# Patient Record
Sex: Female | Born: 1970 | Race: White | Hispanic: No | Marital: Single | Smoking: Never smoker
Health system: Southern US, Community
[De-identification: ages and names within clinical notes are randomized; demographics above are authoritative.]

## PROBLEM LIST (undated history)

## (undated) DIAGNOSIS — E669 Obesity, unspecified: Secondary | ICD-10-CM

## (undated) DIAGNOSIS — J45909 Unspecified asthma, uncomplicated: Secondary | ICD-10-CM

## (undated) DIAGNOSIS — R519 Headache, unspecified: Secondary | ICD-10-CM

## (undated) DIAGNOSIS — D649 Anemia, unspecified: Secondary | ICD-10-CM

## (undated) DIAGNOSIS — R0600 Dyspnea, unspecified: Secondary | ICD-10-CM

## (undated) DIAGNOSIS — I10 Essential (primary) hypertension: Secondary | ICD-10-CM

## (undated) DIAGNOSIS — K219 Gastro-esophageal reflux disease without esophagitis: Secondary | ICD-10-CM

## (undated) DIAGNOSIS — F32A Depression, unspecified: Secondary | ICD-10-CM

## (undated) DIAGNOSIS — R011 Cardiac murmur, unspecified: Secondary | ICD-10-CM

## (undated) DIAGNOSIS — E119 Type 2 diabetes mellitus without complications: Secondary | ICD-10-CM

## (undated) DIAGNOSIS — E079 Disorder of thyroid, unspecified: Secondary | ICD-10-CM

## (undated) HISTORY — PX: BIOPSY THYROID: PRO38

## (undated) HISTORY — PX: ABDOMINAL HYSTERECTOMY: SHX81

## (undated) HISTORY — DX: Obesity, unspecified: E66.9

## (undated) HISTORY — DX: Type 2 diabetes mellitus without complications: E11.9

---

## 1999-01-04 ENCOUNTER — Encounter: Payer: Self-pay | Admitting: Family Medicine

## 1999-01-04 ENCOUNTER — Ambulatory Visit (HOSPITAL_COMMUNITY): Admission: RE | Admit: 1999-01-04 | Discharge: 1999-01-04 | Payer: Self-pay | Admitting: Family Medicine

## 2000-03-31 ENCOUNTER — Emergency Department (HOSPITAL_COMMUNITY): Admission: EM | Admit: 2000-03-31 | Discharge: 2000-03-31 | Payer: Self-pay | Admitting: Emergency Medicine

## 2000-07-12 ENCOUNTER — Ambulatory Visit (HOSPITAL_COMMUNITY): Admission: RE | Admit: 2000-07-12 | Discharge: 2000-07-12 | Payer: Self-pay | Admitting: Family Medicine

## 2000-07-12 ENCOUNTER — Encounter: Payer: Self-pay | Admitting: Family Medicine

## 2001-10-15 ENCOUNTER — Other Ambulatory Visit: Admission: RE | Admit: 2001-10-15 | Discharge: 2001-10-15 | Payer: Self-pay | Admitting: Obstetrics and Gynecology

## 2002-08-11 ENCOUNTER — Encounter: Payer: Self-pay | Admitting: Endocrinology

## 2002-08-11 ENCOUNTER — Ambulatory Visit (HOSPITAL_COMMUNITY): Admission: RE | Admit: 2002-08-11 | Discharge: 2002-08-11 | Payer: Self-pay | Admitting: Endocrinology

## 2002-11-27 ENCOUNTER — Other Ambulatory Visit: Admission: RE | Admit: 2002-11-27 | Discharge: 2002-11-27 | Payer: Self-pay | Admitting: Obstetrics and Gynecology

## 2005-07-13 ENCOUNTER — Ambulatory Visit (HOSPITAL_COMMUNITY): Admission: RE | Admit: 2005-07-13 | Discharge: 2005-07-13 | Payer: Self-pay | Admitting: Chiropractic Medicine

## 2005-08-18 ENCOUNTER — Emergency Department (HOSPITAL_COMMUNITY): Admission: EM | Admit: 2005-08-18 | Discharge: 2005-08-18 | Payer: Self-pay | Admitting: Emergency Medicine

## 2005-08-20 ENCOUNTER — Emergency Department (HOSPITAL_COMMUNITY): Admission: EM | Admit: 2005-08-20 | Discharge: 2005-08-20 | Payer: Self-pay | Admitting: Emergency Medicine

## 2006-12-03 ENCOUNTER — Encounter: Admission: RE | Admit: 2006-12-03 | Discharge: 2006-12-03 | Payer: Self-pay | Admitting: Emergency Medicine

## 2020-10-20 ENCOUNTER — Other Ambulatory Visit: Payer: Self-pay

## 2020-10-20 ENCOUNTER — Emergency Department (HOSPITAL_COMMUNITY)
Admission: EM | Admit: 2020-10-20 | Discharge: 2020-10-20 | Disposition: A | Discharge status: Home / Self Care | Payer: Managed Care, Other (non HMO) | Attending: Emergency Medicine | Admitting: Emergency Medicine

## 2020-10-20 ENCOUNTER — Encounter (HOSPITAL_COMMUNITY): Payer: Self-pay

## 2020-10-20 DIAGNOSIS — I10 Essential (primary) hypertension: Secondary | ICD-10-CM | POA: Diagnosis not present

## 2020-10-20 DIAGNOSIS — R030 Elevated blood-pressure reading, without diagnosis of hypertension: Secondary | ICD-10-CM | POA: Diagnosis present

## 2020-10-20 DIAGNOSIS — Z79899 Other long term (current) drug therapy: Secondary | ICD-10-CM | POA: Diagnosis not present

## 2020-10-20 HISTORY — DX: Essential (primary) hypertension: I10

## 2020-10-20 HISTORY — DX: Disorder of thyroid, unspecified: E07.9

## 2020-10-20 LAB — COMPREHENSIVE METABOLIC PANEL
ALT: 15 U/L (ref 0–44)
AST: 17 U/L (ref 15–41)
Albumin: 3.4 g/dL — ABNORMAL LOW (ref 3.5–5.0)
Alkaline Phosphatase: 51 U/L (ref 38–126)
Anion gap: 7 (ref 5–15)
BUN: 12 mg/dL (ref 6–20)
CO2: 25 mmol/L (ref 22–32)
Calcium: 9 mg/dL (ref 8.9–10.3)
Chloride: 105 mmol/L (ref 98–111)
Creatinine, Ser: 0.66 mg/dL (ref 0.44–1.00)
GFR, Estimated: >60 mL/min (ref >60)
Glucose, Bld: 251 mg/dL — ABNORMAL HIGH (ref 70–99)
Potassium: 3.8 mmol/L (ref 3.5–5.1)
Sodium: 137 mmol/L (ref 135–145)
Total Bilirubin: 0.6 mg/dL (ref 0.3–1.2)
Total Protein: 6.9 g/dL (ref 6.5–8.1)

## 2020-10-20 LAB — CBC
HCT: 38.7 % (ref 36.0–46.0)
Hemoglobin: 12 g/dL (ref 12.0–15.0)
MCH: 26.8 pg (ref 26.0–34.0)
MCHC: 31 g/dL (ref 30.0–36.0)
MCV: 86.4 fL (ref 80.0–100.0)
Platelets: 250 10*3/uL (ref 150–400)
RBC: 4.48 MIL/uL (ref 3.87–5.11)
RDW: 19 % — ABNORMAL HIGH (ref 11.5–15.5)
WBC: 4 10*3/uL (ref 4.0–10.5)
nRBC: 0 % (ref 0.0–0.2)

## 2020-10-20 LAB — LIPASE, BLOOD: Lipase: 32 U/L (ref 11–51)

## 2020-10-20 MED ORDER — AMLODIPINE BESYLATE 5 MG PO TABS
5.0000 mg | ORAL_TABLET | Freq: Once | ORAL | Status: DC
Start: 2020-10-20 — End: 2020-10-20
  Filled 2020-10-20: qty 1

## 2020-10-20 NOTE — ED Provider Notes (Signed)
[facility] EMERGENCY DEPARTMENT Provider Note   CSN: [id number] Arrival date & time: 10/20/20  0647     History No chief complaint on file.   Laurice A Roszak is a 50 y.o. female with a pmh of HTN, migraines, grave's and T2DM presenting with concerns about her BP. She moved to [facility] from [place] in February and is out of her Lisinopril 40mg  and Norvasc 10mg , but has her Olmesartan 40mg , HCTZ 25mg  and Metoprolol ER 25mg . Says her blood pressures at home run anywhere from 170s/90s and 200s/100s. Did not take her medication this AM.  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 Says her labs alternate between hyper and hypothyroidism. Has a family history of MEN which concerns her. Has had an endocrinology appointment that keeps being cancelled. No PCP but a clinic in the area drew her blood and told her that her DM is uncontrolled on her Metformin.  Overall, patient reports many concerns surrounding inconsistent medical care due to insurance laps and being "picky about doctors." When asked what made her come in today she states that she has had "left sided pains" in her ankle, elbow and shoulder for a month and she worried it could be a heart attack. Denies CP, palpitations, SOB.   Requests referrals to low-cost clinics and financial assistance for medications. Reports she "has no money until Friday" and is living with her friends, but they are driving her crazy.   HPI   Past Medical History:  Diagnosis Date   Hypertension    Thyroid disease     There are no problems to display for this patient.   History reviewed. No pertinent surgical history.   OB History   No obstetric history on file.     No family history on file.     Home Medications Prior to Admission medications   Medication Sig Start Date End Date Taking? Authorizing Provider  acetaminophen (TYLENOL) 325 MG tablet Take 650 mg by mouth every 6 (six) hours as needed.   Yes  [provider]  amLODipine (NORVASC) 10 MG tablet Take 10 mg by mouth daily.   Yes [provider]  ferrous sulfate 325 (65 FE) MG tablet Take 325 mg by mouth 2 (two) times daily with a meal.   Yes [provider]  fluticasone (VERAMYST) 27.5 MCG/SPRAY nasal spray Place 1 spray into the nose daily.   Yes [provider]  hydrochlorothiazide (HYDRODIURIL) 25 MG tablet Take 25 mg by mouth daily.   Yes [provider]  lisinopril (ZESTRIL) 40 MG tablet Take 40 mg by mouth daily.   Yes [provider]  metFORMIN (GLUCOPHAGE) 1000 MG tablet Take 1,000 mg by mouth 2 (two) times daily with a meal.   Yes [provider]  metoprolol succinate (TOPROL-XL) 25 MG 24 hr tablet Take 25 mg by mouth daily.   Yes [provider]  olmesartan (BENICAR) 40 MG tablet Take 40 mg by mouth daily.   Yes [provider]  omeprazole (PRILOSEC) 20 MG capsule Take 20 mg by mouth daily.   Yes [provider]  sodium chloride (OCEAN) 0.65 % SOLN nasal spray Place 1 spray into both nostrils as needed for congestion.   Yes [provider]  White Petrolatum-Mineral Oil (REFRESH LACRI-LUBE OP) Apply 1-2 drops to eye daily.   Yes [provider]    Allergies    Patient has no known allergies.  Review of Systems   Review of  Systems  Constitutional:  Positive for fatigue. Negative for activity change and fever.  Eyes:  Negative for photophobia and visual disturbance.  Respiratory:  Negative for chest tightness, shortness of breath and wheezing.   Cardiovascular:  Negative for chest pain, palpitations and leg swelling.  Gastrointestinal:  Positive for abdominal distention, constipation and diarrhea. Negative for abdominal pain, nausea and vomiting.  Endocrine: Positive for cold intolerance and heat intolerance. Negative for polydipsia, polyphagia and polyuria.  Musculoskeletal:  Negative for back pain.  Skin:  Negative  for color change and pallor.  Neurological:  Positive for headaches. Negative for dizziness, syncope, light-headedness and numbness.  Psychiatric/Behavioral:  Negative for confusion.    Physical Exam Updated Vital Signs BP (!) 162/77   Pulse 62   Temp 98.4 F (36.9 C) (Oral)   Resp 11   SpO2 100%   Physical Exam Constitutional:      General: She is not in acute distress.    Appearance: Normal appearance.  HENT:     Head: Normocephalic and atraumatic.  Eyes:     Extraocular Movements: Extraocular movements intact.     Conjunctiva/sclera: Conjunctivae normal.     Pupils: Pupils are equal, round, and reactive to light.  Cardiovascular:     Rate and Rhythm: Normal rate and regular rhythm.     Pulses: Normal pulses.     Heart sounds: No murmur heard.   No gallop.  Pulmonary:     Effort: No respiratory distress.     Breath sounds: No wheezing.  Chest:     Chest wall: No tenderness.  Abdominal:     General: Abdomen is flat. There is no distension.     Palpations: Abdomen is soft.     Tenderness: There is no abdominal tenderness.  Musculoskeletal:        General: No swelling.     Cervical back: Normal range of motion.     Right lower leg: No edema.     Left lower leg: No edema.  Skin:    General: Skin is warm and dry.     Findings: No bruising or lesion.  Neurological:     General: No focal deficit present.     Mental Status: She is alert and oriented to person, place, and time.  Psychiatric:        Thought Content: Thought content normal.     Comments: Multiple complaints, very anxious    ED Results / Procedures / Treatments   Labs (all labs ordered are listed, but only abnormal results are displayed) Labs Reviewed  COMPREHENSIVE METABOLIC PANEL - Abnormal; Notable for the following components:      Result Value   Glucose, Bld 251 (*)    Albumin 3.4 (*)    All other components within normal limits  CBC - Abnormal; Notable for the following components:   RDW  19.0 (*)    All other components within normal limits  LIPASE, BLOOD    EKG None  Radiology No results found.  Procedures Procedures   Medications Ordered in ED Medications  amLODipine (NORVASC) tablet 5 mg (has no administration in time range)    ED Course  I have reviewed the triage vital signs and the nursing notes.  Pertinent labs & imaging results that were available during my care of the patient were reviewed by me and considered in my medical decision making (see chart for details).    MDM Rules/Calculators/A&P  Patient evaluated by me. History contains many medical complaints and stories. Blood pressure between 160-180/70s throughout my evaluation. Current glucose 251. Has not had metformin since her move.  Patient in need of a PCP and likely an endocrinologist.   Lab work is benign and patient is in NAD and HD stable. She does not complain of associated CP, H/A, abd pain or changes in her vision. No signs of AAA, dissection or HTN urgency/emergency.  Due to her stability and chronic complaints, I will discharge here with her norvasc, lisinopril and metformin and refer her to low cost clinics in the area.   8:45a Patient surprised that her BP is good here, and she is concerned about her cuff. Apprehensive of d/c. Assured that none of her evaluation was concerning. She does not want goodrx information/prescriptions. Will follow-up with pcp about her DM & thyroid function. We will let them decide which of her 5 BP medications are neccessary.    Final Clinical Impression(s) / ED Diagnoses Final diagnoses:  Hypertension, unspecified type    Rx / DC Orders ED Discharge Orders     None        Angelisa Winthrop, [place], PA-C 10/20/20 Amanda Park, [place], DO 10/22/20 [id number]

## 2020-10-20 NOTE — Discharge Instructions (Addendum)
Your bloodwork was reassuring today. We were unable to get a thyroid level on you, so please discuss this with your new primary care doctor.   As we discussed, your pcp will discuss which of your 5 blood pressure medications to stay on. Continue to monitor at home, and check the accuracy of your cuff at any healthcare clinic.  Return to the ER if you have a sudden onset headache, chest or back pain, slurring in your speech or changes of vision.

## 2020-10-20 NOTE — ED Triage Notes (Addendum)
Patient here for HTN. States that her BP has been elevated for several months and is out of norvasc and lisinopril and taking her HCTZ.  Patient alert and oriented, NAD.  Reports intermittent left shoulder and ankle pain. Also complains of abdominal bloating

## 2020-10-20 NOTE — ED Notes (Signed)
Lab to add on TSH

## 2020-10-20 NOTE — ED Notes (Signed)
Patient verbalizes understanding of discharge instructions. Opportunity for questioning and answers were provided. Armband removed by staff, pt discharged from ED and ambulated to lobby to return home.   

## 2020-10-27 ENCOUNTER — Encounter (HOSPITAL_COMMUNITY): Payer: Self-pay | Admitting: Emergency Medicine

## 2020-10-27 ENCOUNTER — Other Ambulatory Visit: Payer: Self-pay

## 2020-10-27 ENCOUNTER — Emergency Department (HOSPITAL_COMMUNITY)
Admission: EM | Admit: 2020-10-27 | Discharge: 2020-10-27 | Disposition: A | Discharge status: Home / Self Care | Payer: Managed Care, Other (non HMO) | Attending: Emergency Medicine | Admitting: Emergency Medicine

## 2020-10-27 DIAGNOSIS — E1165 Type 2 diabetes mellitus with hyperglycemia: Secondary | ICD-10-CM | POA: Insufficient documentation

## 2020-10-27 DIAGNOSIS — R112 Nausea with vomiting, unspecified: Secondary | ICD-10-CM | POA: Diagnosis not present

## 2020-10-27 DIAGNOSIS — I10 Essential (primary) hypertension: Secondary | ICD-10-CM | POA: Diagnosis not present

## 2020-10-27 DIAGNOSIS — Z79899 Other long term (current) drug therapy: Secondary | ICD-10-CM | POA: Diagnosis not present

## 2020-10-27 DIAGNOSIS — R197 Diarrhea, unspecified: Secondary | ICD-10-CM | POA: Insufficient documentation

## 2020-10-27 DIAGNOSIS — Z7984 Long term (current) use of oral hypoglycemic drugs: Secondary | ICD-10-CM | POA: Diagnosis not present

## 2020-10-27 DIAGNOSIS — N939 Abnormal uterine and vaginal bleeding, unspecified: Secondary | ICD-10-CM | POA: Diagnosis not present

## 2020-10-27 DIAGNOSIS — R102 Pelvic and perineal pain: Secondary | ICD-10-CM | POA: Diagnosis present

## 2020-10-27 LAB — CBC WITH DIFFERENTIAL/PLATELET
Abs Immature Granulocytes: 0.04 10*3/uL (ref 0.00–0.07)
Basophils Absolute: 0 10*3/uL (ref 0.0–0.1)
Basophils Relative: 0 %
Eosinophils Absolute: 0 10*3/uL (ref 0.0–0.5)
Eosinophils Relative: 0 %
HCT: 33.4 % — ABNORMAL LOW (ref 36.0–46.0)
Hemoglobin: 10.5 g/dL — ABNORMAL LOW (ref 12.0–15.0)
Immature Granulocytes: 0 %
Lymphocytes Relative: 6 %
Lymphs Abs: 0.7 10*3/uL (ref 0.7–4.0)
MCH: 27 pg (ref 26.0–34.0)
MCHC: 31.4 g/dL (ref 30.0–36.0)
MCV: 85.9 fL (ref 80.0–100.0)
Monocytes Absolute: 0.4 10*3/uL (ref 0.1–1.0)
Monocytes Relative: 3 %
Neutro Abs: 10.8 10*3/uL — ABNORMAL HIGH (ref 1.7–7.7)
Neutrophils Relative %: 91 %
Platelets: 330 10*3/uL (ref 150–400)
RBC: 3.89 MIL/uL (ref 3.87–5.11)
RDW: 18.6 % — ABNORMAL HIGH (ref 11.5–15.5)
WBC: 12 10*3/uL — ABNORMAL HIGH (ref 4.0–10.5)
nRBC: 0 % (ref 0.0–0.2)

## 2020-10-27 LAB — COMPREHENSIVE METABOLIC PANEL
ALT: 17 U/L (ref 0–44)
AST: 16 U/L (ref 15–41)
Albumin: 4 g/dL (ref 3.5–5.0)
Alkaline Phosphatase: 63 U/L (ref 38–126)
Anion gap: 12 (ref 5–15)
BUN: 9 mg/dL (ref 6–20)
CO2: 25 mmol/L (ref 22–32)
Calcium: 9.2 mg/dL (ref 8.9–10.3)
Chloride: 99 mmol/L (ref 98–111)
Creatinine, Ser: 0.7 mg/dL (ref 0.44–1.00)
GFR, Estimated: >60 mL/min (ref >60)
Glucose, Bld: 358 mg/dL — ABNORMAL HIGH (ref 70–99)
Potassium: 3.6 mmol/L (ref 3.5–5.1)
Sodium: 136 mmol/L (ref 135–145)
Total Bilirubin: 0.3 mg/dL (ref 0.3–1.2)
Total Protein: 7.7 g/dL (ref 6.5–8.1)

## 2020-10-27 LAB — TYPE AND SCREEN
ABO/RH(D): A POS
Antibody Screen: NEGATIVE

## 2020-10-27 LAB — I-STAT BETA HCG BLOOD, ED (MC, WL, AP ONLY): I-stat hCG, quantitative: <5 m[IU]/mL (ref <5)

## 2020-10-27 LAB — LIPASE, BLOOD: Lipase: 26 U/L (ref 11–51)

## 2020-10-27 MED ORDER — SODIUM CHLORIDE 0.9 % IV BOLUS
1000.0000 mL | Freq: Once | INTRAVENOUS | Status: AC
  Administered 2020-10-27: 1000 mL via INTRAVENOUS

## 2020-10-27 MED ORDER — ONDANSETRON 4 MG PO TBDP
4.0000 mg | ORAL_TABLET | Freq: Once | ORAL | Status: AC
  Administered 2020-10-27: 4 mg via ORAL
  Filled 2020-10-27: qty 1

## 2020-10-27 MED ORDER — ONDANSETRON 4 MG PO TBDP: 4.0000 mg | ORAL_TABLET | Freq: Three times a day (TID) | ORAL | 0 refills | Status: DC | PRN

## 2020-10-27 MED ORDER — ONDANSETRON HCL 4 MG/2ML IJ SOLN
4.0000 mg | Freq: Once | INTRAMUSCULAR | Status: AC
  Administered 2020-10-27: 4 mg via INTRAVENOUS
  Filled 2020-10-27: qty 2

## 2020-10-27 NOTE — ED Provider Notes (Signed)
Emergency Medicine Provider Triage Evaluation Note  Crystal Vasquez , a 50 y.o. female  was evaluated in triage.  Pt complains of nvd, abd pain and vaginal bleeding that started yesterday. Hx fibroids and heavy menses. States she has passed many clots.  Review of Systems  Positive: Nvd, abd pain, vaginal bleeding Negative: Fevers, syncope  Physical Exam  BP (!) 206/94 (BP Location: Right Arm)   Pulse 81   Temp 98.5 F (36.9 C) (Oral)   Resp 16   Ht 5\' 6"  (1.676 m)   Wt 81.6 kg   LMP 10/26/2020   SpO2 100%   BMI 29.05 kg/m  Gen:   Awake, no distress   Resp:  Normal effort  MSK:   Moves extremities without difficulty  Other:  Bilat lower abd ttp, pale conjunctiva  Medical Decision Making  Medically screening exam initiated at 1:25 PM.  Appropriate orders placed.  Crystal Vasquez was informed that the remainder of the evaluation will be completed by another provider, this initial triage assessment does not replace that evaluation, and the importance of remaining in the ED until their evaluation is complete.     Bishop Dublin 10/27/20 1325    Milton Ferguson, MD 10/31/20 1028

## 2020-10-27 NOTE — ED Triage Notes (Addendum)
Pt here for watery diarrhea and vaginal bleeding that started yesterday, N/V that began this morning. Pt only able to tolerate sips of water. Hx fibroids, stating she's passed multiple clots vaginally. C/o lower abd pain.

## 2020-10-27 NOTE — Discharge Instructions (Addendum)
We discussed that your nausea / vomiting / diarrhea appears acute and limited in nature, and may be secondary to a short term food poisoning vs a short lived viral infection. Recommend re-evaluation if symptoms persist despite anti-nausea medications. We discussed there is no active hemorrhage and that vaginal bleeding is large in quantity but appears consistent with her perimenopausal irregular periods along with known diagnosis of fibroids. I recommend you follow up with your OBGYN at your earliest convenience. We discussed that your blood pressure is elevated today partially due to inability to take your blood pressure medications. Return to taking your medications as directed as soon as you are able to, and return to the emergency department if you are experiencing new onset vision changes, chest pain, shortness of breath, weakness, numbness, or other mental changes. Follow up with PCP for better blood pressure control as soon as you are able.

## 2020-10-27 NOTE — ED Provider Notes (Signed)
[facility] EMERGENCY DEPARTMENT Provider Note   CSN: [id number] Arrival date & time: 10/27/20  1248     History Chief Complaint  Patient presents with   Vaginal Bleeding   Abdominal Pain    Crystal Vasquez is a 50 y.o. female with a PMH significant for HTN, DM, and uterine fibroids who presents to the department today complaining of one day of acute nausea, vomiting, and diarrhea following a 4am meal of [employer] this morning. Patient has vomited 5 times and had ongoing diarrhea since this episode began. Patient denies acute abdominal pain. Patient denies blood in emesis or diarrhea. Patient also reports heavy vaginal bleeding for the past 2 days which she reports is consistent with an ongoing pattern of heavy vaginal bleeding 2/2 perimenopause and an established diagnosis of uterine fibroids. Patient is nonetheless concerned about the amount of bleeding and would like evaluation to rule out new onset pathology, ongoing hemorrhage. Patient is experiencing her usual amount of pelvic pain and discomfort with her menstrual periods. She also reports alternating chills and hot flashes over the past few weeks. Incidental finding of hypertension to 206/94 on presentation to the department. Patient denies new onset acute vision changes, drooping facial muscles, loss of balance, numbness, tingling, mental deficit. Patient is currently taking HCTZ and Olmesartan but has not taken either of these medications today due to nausea and vomiting. Patient has also not taken her metformin today as prescribed.   Vaginal Bleeding Associated symptoms: abdominal pain and nausea   Associated symptoms: no dysuria   Abdominal Pain Associated symptoms: diarrhea, nausea, vaginal bleeding and vomiting   Associated symptoms: no chest pain, no cough, no dysuria, no shortness of breath and no sore throat       Past Medical History:  Diagnosis Date   Hypertension    Thyroid disease     There  are no problems to display for this patient.   History reviewed. No pertinent surgical history.   OB History   No obstetric history on file.     History reviewed. No pertinent family history.     Home Medications Prior to Admission medications   Medication Sig Start Date End Date Taking? Authorizing Provider  acetaminophen (TYLENOL) 325 MG tablet Take 650 mg by mouth every 6 (six) hours as needed for moderate pain.   Yes [provider]  ferrous sulfate 325 (65 FE) MG tablet Take 325 mg by mouth 2 (two) times daily with a meal.   Yes [provider]  fluticasone (VERAMYST) 27.5 MCG/SPRAY nasal spray Place 1 spray into the nose daily.   Yes [provider]  hydrochlorothiazide (HYDRODIURIL) 25 MG tablet Take 25 mg by mouth daily.   Yes [provider]  lisinopril (ZESTRIL) 40 MG tablet Take 40 mg by mouth daily.   Yes [provider]  metFORMIN (GLUCOPHAGE) 1000 MG tablet Take 1,000 mg by mouth 2 (two) times daily with a meal.   Yes [provider]  metoprolol succinate (TOPROL-XL) 25 MG 24 hr tablet Take 25 mg by mouth daily.   Yes [provider]  olmesartan (BENICAR) 40 MG tablet Take 40 mg by mouth daily.   Yes [provider]  omeprazole (PRILOSEC) 20 MG capsule Take 20 mg by mouth daily.   Yes [provider]  ondansetron (ZOFRAN ODT) 4 MG disintegrating tablet Take 1 tablet (4 mg total) by mouth every 8 (eight) hours as needed for nausea or vomiting. 10/27/20  Yes Jasie Meleski  H, PA-C  sodium chloride (OCEAN) 0.65 % SOLN nasal spray Place 1 spray into both nostrils as needed for congestion.   Yes [provider]  White Petrolatum-Mineral Oil (REFRESH LACRI-LUBE OP) Apply 1-2 drops to eye daily.   Yes [provider]  amLODipine (NORVASC) 10 MG tablet Take 10 mg by mouth daily.    [provider]    Allergies    Patient has no known allergies.  Review of Systems    Review of Systems  Constitutional:  Positive for appetite change.       Alternating chills and hot flashes  HENT:  Negative for congestion, sinus pressure, sinus pain and sore throat.   Eyes:  Negative for visual disturbance.  Respiratory:  Negative for cough, chest tightness and shortness of breath.   Cardiovascular:  Negative for chest pain and palpitations.  Gastrointestinal:  Positive for abdominal pain, diarrhea, nausea and vomiting. Negative for blood in stool.  Genitourinary:  Positive for pelvic pain and vaginal bleeding. Negative for dysuria and flank pain.  Neurological:  Negative for syncope, speech difficulty, weakness, numbness and headaches.   Physical Exam Updated Vital Signs BP (!) 144/79   Pulse 96   Temp 98.5 F (36.9 C) (Oral)   Resp 15   Ht 5\' 6"  (1.676 m)   Wt 81.6 kg   LMP 10/26/2020   SpO2 100%   BMI 29.05 kg/m   Physical Exam Vitals and nursing note reviewed. Exam conducted with a chaperone present (during pelvic exam).  Constitutional:      Appearance: She is well-developed.     Comments: Patient appears somewhat pale and ill-appearing  HENT:     Head: Normocephalic and atraumatic.     Mouth/Throat:     Pharynx: Oropharynx is clear.  Eyes:     Extraocular Movements: Extraocular movements intact.     Pupils: Pupils are equal, round, and reactive to light.  Cardiovascular:     Rate and Rhythm: Normal rate and regular rhythm.     Heart sounds: Normal heart sounds.  Pulmonary:     Effort: Pulmonary effort is normal.     Breath sounds: Normal breath sounds.  Abdominal:     General: Abdomen is flat. Bowel sounds are normal. There is no distension. There are no signs of injury.     Palpations: Abdomen is soft.     Comments: Diffuse mild tenderness over bilateral lower quadrants  Genitourinary:    Vagina: Bleeding present. No vaginal discharge.     Uterus: Enlarged.      Adnexa: Right adnexa normal and left adnexa normal.     Comments: External  vulva, labia, and vaginal introitus appear moist, pink, without lesion. Large amount of blood and clots found in vaginal vault. Cervix visualized and without active hemorrhage at the time of exam. Slow leak of menstrual blood. Bimanual exam reveals large palpable fibroids in approximately the 5-6 o clock position on the uterus with relation to the cervix. No other masses palpated. Skin:    General: Skin is warm.     Coloration: Skin is pale.  Neurological:     General: No focal deficit present.     Mental Status: She is alert and oriented to person, place, and time.     Cranial Nerves: No cranial nerve deficit.     Motor: No weakness.  Psychiatric:        Mood and Affect: Mood normal.        Behavior: Behavior normal.  ED Results / Procedures / Treatments   Labs (all labs ordered are listed, but only abnormal results are displayed) Labs Reviewed  COMPREHENSIVE METABOLIC PANEL - Abnormal; Notable for the following components:      Result Value   Glucose, Bld 358 (*)    All other components within normal limits  CBC WITH DIFFERENTIAL/PLATELET - Abnormal; Notable for the following components:   WBC 12.0 (*)    Hemoglobin 10.5 (*)    HCT 33.4 (*)    RDW 18.6 (*)    Neutro Abs 10.8 (*)    All other components within normal limits  LIPASE, BLOOD  I-STAT BETA HCG BLOOD, ED ([facility], [facility], [facility] ONLY)  TYPE AND SCREEN  ABO/RH    EKG None  Radiology No results found.  Procedures Procedures   Medications Ordered in ED Medications  ondansetron (ZOFRAN-ODT) disintegrating tablet 4 mg (4 mg Oral Given 10/27/20 1327)  ondansetron (ZOFRAN) injection 4 mg (4 mg Intravenous Given 10/27/20 1939)  sodium chloride 0.9 % bolus 1,000 mL (0 mLs Intravenous Stopped 10/27/20 2123)    ED Course  I have reviewed the triage vital signs and the nursing notes.  Pertinent labs & imaging results that were available during my care of the patient were reviewed by me and considered in my medical decision  making (see chart for details).     MDM Rules/Calculators/A&P                         Nausea/Vomiting/Diarrhea Physical exam shows only diffuse lower abdominal pain. Likely menstrual in origin. Patient with mildly elevated white count. Presentation is consistent with foodborne illness vs. viral gastroenteritis. Lipase negative. Patient's nausea and vomiting improved markedly with second dose of zofran, and small IV bolus of NS. Low concern for appendicitis or diverticulitis at this time. Patient should follow up with PCP or GI if N/V/D diarrhea does not improve. Rx zofran given to patient, fluid intake encouraged.  Vaginal Bleeding History reveals bleeding is consistent with existing findings of perimenopausal intermittent heavy periods in context of large uterine fibroids. Patient nonetheless concerned about amount of bleeding. Beta hCG negative. Pelvic exam reveals large clots in vaginal introitus with minimal slow bleeding from cervical os. No evidence of hemorrhage at this time. Bimanual exam reveals presence of large mass at 5-6 o clock position on the uterus with respect to the cervical os consistent with a large fibroid. Patient reassured no concerning hemorrhage at this time. Hgb subcritical at 10.5. Patient encouraged to continue oral iron supplementation as nausea allows. Given decrease of Hgb by 1.5 points from last visit one week ago, patient encouraged to monitor for signs of lightheadedness, presyncope, weakness, pallor and given low threshold to seek re-evaluation for blood loss. In the meantime encouraged prompt follow up with OBGYN to further evaluate menstrual bleeding.  Asymptomatic Hypertension No focal neurologic deficits. No evidence of kidney damage on CMP. No acute visual changes. Patient denies chest pain, shortness of breath. Discussed with patient no evidence of end organ damage at this time. Given patient did not comply with medication this morning, recommend patient restart  HTN medications once her nausea allows. Given ongoing labile blood pressures, recommend patient follow up with PCP at her earliest convenience for additional control of HTN. Patient should return to ED if she is experiencing signs of stroke, chest pain, acute vision changes. Hypertension markedly improved to 144/79 at time of discharge.  Hyperglycemia Patient without any signs or symptoms of  DKA or HHS at this time. Patient did not take her metformin this morning. Recommend she take her metformin when resolution of nausea allows. Patient should check her blood sugars throughout the day and follow up with her PCP for tighter glycemic control. Final Clinical Impression(s) / ED Diagnoses Final diagnoses:  Vaginal bleeding  Nausea and vomiting, intractability of vomiting not specified, unspecified vomiting type  Hypertension, unspecified type    Rx / DC Orders ED Discharge Orders          Ordered    ondansetron (ZOFRAN ODT) 4 MG disintegrating tablet  Every 8 hours PRN        10/27/20 2036             Dorien Chihuahua 10/27/20 2309    Davonna Belling, MD 10/27/20 [phone]

## 2020-10-27 NOTE — ED Provider Notes (Signed)
Pt seen in conjunction with C Prosperi, PA-C. Please see his notes for full history, physical, and plan.   In brief, patient presenting for multiple complaints.  Today she developed nausea, vomiting, diarrhea after eating [employer] at 4 AM.  No significant abdominal pain.  No fevers. Additionally, patient reporting elevated blood pressures, however no chest pain, headache, shortness of breath, or change in urination.  History of hypertension, is on 5 different blood pressure medications.  She is unable to take them this morning due to nausea. Additionally, patient reporting vaginal bleeding and lower abdominal pain. She has h/o fibroids and due to being perimenopausal, h/o heavy vaginal bleeding.   On exam, patient is nontoxic.  No abdominal tenderness on my exam.  Labs overall reassuring.  Symptoms resolved with Zofran and fluids.  Pelvic exam showed large clot, but no significant hemorrhage.  Hemoglobin lower than a week ago, however in the setting of normal blood pressure and no tachycardia and no symptoms of anemia, patient does not need transfusion.  Encouraged follow-up with OB/GYN.     Franchot Heidelberg, PA-C 10/27/20 2206    Davonna Belling, MD 10/27/20 2324

## 2020-11-11 ENCOUNTER — Other Ambulatory Visit: Payer: Self-pay

## 2020-11-11 ENCOUNTER — Other Ambulatory Visit (HOSPITAL_COMMUNITY)
Admission: RE | Admit: 2020-11-11 | Discharge: 2020-11-11 | Disposition: A | Discharge status: Home / Self Care | Payer: Managed Care, Other (non HMO) | Source: Ambulatory Visit | Attending: Obstetrics and Gynecology | Admitting: Obstetrics and Gynecology

## 2020-11-11 ENCOUNTER — Ambulatory Visit (INDEPENDENT_AMBULATORY_CARE_PROVIDER_SITE_OTHER): Payer: Managed Care, Other (non HMO) | Admitting: Obstetrics and Gynecology

## 2020-11-11 ENCOUNTER — Encounter: Payer: Self-pay | Admitting: Obstetrics and Gynecology

## 2020-11-11 VITALS — BP 146/71 | HR 76 | Ht 66.0 in | Wt 180.0 lb

## 2020-11-11 DIAGNOSIS — D500 Iron deficiency anemia secondary to blood loss (chronic): Secondary | ICD-10-CM

## 2020-11-11 DIAGNOSIS — Z5941 Food insecurity: Secondary | ICD-10-CM | POA: Diagnosis not present

## 2020-11-11 DIAGNOSIS — Z8742 Personal history of other diseases of the female genital tract: Secondary | ICD-10-CM

## 2020-11-11 DIAGNOSIS — N939 Abnormal uterine and vaginal bleeding, unspecified: Secondary | ICD-10-CM | POA: Diagnosis not present

## 2020-11-11 DIAGNOSIS — Z86018 Personal history of other benign neoplasm: Secondary | ICD-10-CM

## 2020-11-11 DIAGNOSIS — R87619 Unspecified abnormal cytological findings in specimens from cervix uteri: Secondary | ICD-10-CM

## 2020-11-11 DIAGNOSIS — N888 Other specified noninflammatory disorders of cervix uteri: Secondary | ICD-10-CM

## 2020-11-11 MED ORDER — TRANEXAMIC ACID 650 MG PO TABS: 1300.0000 mg | ORAL_TABLET | Freq: Three times a day (TID) | ORAL | 2 refills | Status: DC

## 2020-11-11 NOTE — Progress Notes (Signed)
Obstetrics and Gynecology New Patient Evaluation  Appointment Date: 11/11/2020  OBGYN Clinic: [facility]  Primary Care Provider: Has establishment of care visit on 9/13 with Dr. Dorna Mai  Referring Provider: [facility]  Chief Complaint:  Chief Complaint  Patient presents with   Menorrhagia    History of Present Illness: Crystal Vasquez is a 50 y.o. Caucasian [id number] (Patient's last menstrual period was 10/25/2020 (exact date).), seen for the above chief complaint. Her past medical history is significant for fibroids, HTN, DM2, Grave's dz  Patient seen in the ED on 8/10 for heavy vaginal bleeding; no imaging done and Hgb 10.5 from 12 the week prior in the ED (visit for meds refill) and exp management advised for the patient and follow up with a GYN.   Patient states bleeding stopped a few days ago.  She denies any bulk s/s or dyspareunia. +climateric s/s.   Review of Systems: Pertinent items noted in HPI and remainder of comprehensive ROS otherwise negative.    Patient Active Problem List   Diagnosis Date Noted   Abnormal uterine bleeding (AUB) 11/11/2020   History of uterine fibroid 11/11/2020   Iron deficiency anemia due to chronic blood loss 11/11/2020   History of menorrhagia 11/11/2020    Past Medical History:  Past Medical History:  Diagnosis Date   DM2    Hypertension    Thyroid disease     Past Surgical History:  History reviewed. No pertinent surgical history.  Past Obstetrical History:  OB History  Gravida Para Term Preterm AB Living  3 0 0 0 3 0  SAB IAB Ectopic Multiple Live Births  1 2 0 0 0    # Outcome Date GA Lbr Len/2nd Weight Sex Delivery Anes PTL Lv  3 IAB           2 IAB           1 SAB             Past Gynecological History: As per HPI. Periods: Patient with long history of heavy periods. Cycles were regular lasting for about 1 week with heaviest bleeding on the first day where she would  bleed through a tampon in one hour. Her cycles began to be irregular one year ago and now occur every 2-3 months in which she experiences heavy bleeding and passing of clots. Last cycle in august began Aug 8 and ended Aug 22. Bleeding is associated with abdominal cramping, pelvic pain, and nausea. History of Pap Smear(s): Yes.   Last pap unknown, which was negative but she does have a remote h/o abnormal paps She is currently using no method for contraception.   Social History:  Social History   Socioeconomic History   Marital status: Single    Spouse name: Not on file   Number of children: Not on file   Years of education: Not on file   Highest education level: Not on file  Occupational History   Not on file  Tobacco Use   Smoking status: Never   Smokeless tobacco: Never  Substance and Sexual Activity   Alcohol use: Not Currently   Drug use: Never   Sexual activity: Not Currently  Other Topics Concern   Not on file  Social History Narrative   Not on file   Social Determinants of Health   Financial Resource Strain: Not on file  Food Insecurity: Food Insecurity Present   Worried About [place] in the  Last Year: Often true   Arboriculturist in the Last Year: Sometimes true  Transportation Needs: Unmet Transportation Needs   Lack of Transportation (Medical): Yes   Lack of Transportation (Non-Medical): Yes  Physical Activity: Not on file  Stress: Not on file  Social Connections: Not on file  Intimate Partner Violence: Not on file    Family History: No family history on file.  Health Maintenance:  Mammogram(s): Late 2021 per patient report, negative.   Medications Crystal Vasquez had no medications administered during this visit. Current Outpatient Medications  Medication Sig Dispense Refill   acetaminophen (TYLENOL) 325 MG tablet Take 650 mg by mouth every 6 (six) hours as needed for moderate pain.     ferrous sulfate 325 (65 FE) MG tablet Take 325 mg  by mouth 2 (two) times daily with a meal.     fluticasone (VERAMYST) 27.5 MCG/SPRAY nasal spray Place 1 spray into the nose daily.     hydrochlorothiazide (HYDRODIURIL) 25 MG tablet Take 25 mg by mouth daily.     metFORMIN (GLUCOPHAGE) 1000 MG tablet Take 1,000 mg by mouth 2 (two) times daily with a meal.     metoprolol succinate (TOPROL-XL) 25 MG 24 hr tablet Take 25 mg by mouth daily.     olmesartan (BENICAR) 40 MG tablet Take 40 mg by mouth daily.     omeprazole (PRILOSEC) 20 MG capsule Take 20 mg by mouth daily.     ondansetron (ZOFRAN ODT) 4 MG disintegrating tablet Take 1 tablet (4 mg total) by mouth every 8 (eight) hours as needed for nausea or vomiting. 20 tablet 0   amLODipine (NORVASC) 10 MG tablet Take 10 mg by mouth daily. (Patient not taking: Reported on 11/11/2020)     lisinopril (ZESTRIL) 40 MG tablet Take 40 mg by mouth daily. (Patient not taking: Reported on 11/11/2020)     No current facility-administered medications for this visit.    Allergies Patient has no known allergies.   Physical Exam:  BP (!) 146/71   Pulse 76   Ht 5\' 6"  (1.676 m)   Wt 180 lb (81.6 kg)   LMP 10/25/2020 (Exact Date)   BMI 29.05 kg/m  Body mass index is 29.05 kg/m. General appearance: Well nourished, well developed female in no acute distress.  Neck:  Supple, normal appearance, and no thyromegaly  Cardiovascular: normal s1 and s2.  No murmurs, rubs or gallops. Respiratory:  Clear to auscultation bilateral. Normal respiratory effort Abdomen: positive bowel sounds and no hernias; diffusely non tender to palpation, non distended Neuro/Psych:  Normal mood and affect.  Skin:  Warm and dry.  Lymphatic:  No inguinal lymphadenopathy.   Pelvic exam: is not limited by body habitus EGBUS: within normal limits Vagina: within normal limits and green/yellow d/c in the vault. No VB Cervix: grossly normal but dilated to 3cm with large mass causing it to be dilated; mass is flush with the external os,  nttp, same color as the cervix to slighly more tan colored, non bleeding or weeping. Pap smear done on this and on the cervix surrounding it. Cervix is nttp Uterus:  enlarged, c/w 14 week size and non tender, mobile Adnexa:  normal adnexa and no mass, fullness, tenderness Rectovaginal: deferred  Laboratory:  CBC Latest Ref Rng & Units 11/11/2020 10/27/2020 10/20/2020  WBC 3.4 - 10.8 x10E3/uL 5.8 12.0(H) 4.0  Hemoglobin 11.1 - 15.9 g/dL 9.3(L) 10.5(L) 12.0  Hematocrit 34.0 - 46.6 % 28.5(L) 33.4(L) 38.7  Platelets 150 -  450 x10E3/uL 430 330 250   CMP Latest Ref Rng & Units 10/27/2020 10/20/2020  Glucose 70 - 99 mg/dL 358(H) 251(H)  BUN 6 - 20 mg/dL 9 12  Creatinine 0.44 - 1.00 mg/dL 0.70 0.66  Sodium 135 - 145 mmol/L 136 137  Potassium 3.5 - 5.1 mmol/L 3.6 3.8  Chloride 98 - 111 mmol/L 99 105  CO2 22 - 32 mmol/L 25 25  Calcium 8.9 - 10.3 mg/dL 9.2 9.0  Total Protein 6.5 - 8.1 g/dL 7.7 6.9  Total Bilirubin 0.3 - 1.2 mg/dL 0.3 0.6  Alkaline Phos 38 - 126 U/L 63 51  AST 15 - 41 U/L 16 17  ALT 0 - 44 U/L 17 15    Radiology: none  Assessment: pt stabld  Plan: 1. Food insecurity - AMBULATORY REFERRAL TO [facility]  2. Abnormal uterine bleeding (AUB) Lysteda given to take if periods start in the interim. Will get an anemia panel. Pt currently asymptomatic. I told her that her s/s are likely due to being perimenopausal and/or her fibroid uterus and that the mass in her cervix is likely a prolapsing fibroid the u/s will give [place] more information but she will likely need some sort of surgical intervention in order to address it. I was going to do an embx, but held off on it given her exam.   In terms of lab and primary care, f/u her PCP appt later next month  3. History of menorrhagia  4. Iron deficiency anemia due to chronic blood loss  5. History of uterine fibroid  RTC after u/s  Durene Romans MD Attending [facility] for [facility])

## 2020-11-12 LAB — ANEMIA PROFILE B
Basophils Absolute: 0 10*3/uL (ref 0.0–0.2)
Basos: 1 %
EOS (ABSOLUTE): 0.1 10*3/uL (ref 0.0–0.4)
Eos: 1 %
Ferritin: 81 ng/mL (ref 15–150)
Folate: 7.3 ng/mL (ref >3.0)
Hematocrit: 28.5 % — ABNORMAL LOW (ref 34.0–46.6)
Hemoglobin: 9.3 g/dL — ABNORMAL LOW (ref 11.1–15.9)
Immature Grans (Abs): 0 10*3/uL (ref 0.0–0.1)
Immature Granulocytes: 0 %
Iron Saturation: 6 % — CL (ref 15–55)
Iron: 21 ug/dL — ABNORMAL LOW (ref 27–159)
Lymphocytes Absolute: 1.9 10*3/uL (ref 0.7–3.1)
Lymphs: 33 %
MCH: 28.4 pg (ref 26.6–33.0)
MCHC: 32.6 g/dL (ref 31.5–35.7)
MCV: 87 fL (ref 79–97)
Monocytes Absolute: 0.4 10*3/uL (ref 0.1–0.9)
Monocytes: 7 %
Neutrophils Absolute: 3.3 10*3/uL (ref 1.4–7.0)
Neutrophils: 58 %
Platelets: 430 10*3/uL (ref 150–450)
RBC: 3.27 x10E6/uL — ABNORMAL LOW (ref 3.77–5.28)
RDW: 19.1 % — ABNORMAL HIGH (ref 11.7–15.4)
Retic Ct Pct: 7.7 % — ABNORMAL HIGH (ref 0.6–2.6)
Total Iron Binding Capacity: 357 ug/dL (ref 250–450)
UIBC: 336 ug/dL (ref 131–425)
Vitamin B-12: 716 pg/mL (ref 232–1245)
WBC: 5.8 10*3/uL (ref 3.4–10.8)

## 2020-11-15 ENCOUNTER — Other Ambulatory Visit: Payer: Self-pay

## 2020-11-15 ENCOUNTER — Ambulatory Visit
Admission: RE | Admit: 2020-11-15 | Discharge: 2020-11-15 | Disposition: A | Discharge status: Home / Self Care | Payer: Managed Care, Other (non HMO) | Source: Ambulatory Visit | Attending: Obstetrics and Gynecology | Admitting: Obstetrics and Gynecology

## 2020-11-15 DIAGNOSIS — N939 Abnormal uterine and vaginal bleeding, unspecified: Secondary | ICD-10-CM | POA: Insufficient documentation

## 2020-11-15 LAB — CERVICOVAGINAL ANCILLARY ONLY
Bacterial Vaginitis (gardnerella): POSITIVE — AB
Candida Glabrata: NEGATIVE
Candida Vaginitis: NEGATIVE
Chlamydia: NEGATIVE
Comment: NEGATIVE
Comment: NEGATIVE
Comment: NEGATIVE
Comment: NEGATIVE
Comment: NEGATIVE
Comment: NORMAL
Neisseria Gonorrhea: NEGATIVE
Trichomonas: NEGATIVE

## 2020-11-15 LAB — CYTOLOGY - PAP
Comment: NEGATIVE
High risk HPV: NEGATIVE

## 2020-11-16 ENCOUNTER — Telehealth: Payer: Self-pay | Admitting: Obstetrics and Gynecology

## 2020-11-16 MED ORDER — METRONIDAZOLE 500 MG PO TABS: 500.0000 mg | ORAL_TABLET | Freq: Two times a day (BID) | ORAL | 0 refills | Status: AC

## 2020-11-16 NOTE — Addendum Note (Signed)
Addended by: Aletha Halim on: 11/16/2020 08:21 AM   Modules accepted: Orders

## 2020-11-16 NOTE — Telephone Encounter (Signed)
GYN Telephone Note Patient called at [phone] to go over lab results. Phone number rang with no answer.  Will send message to patient via Ples Specter MD Attending [facility] for [facility] (Faculty Practice) 11/16/2020 Time: [phone]

## 2020-11-17 ENCOUNTER — Encounter: Payer: Self-pay | Admitting: Obstetrics and Gynecology

## 2020-11-17 ENCOUNTER — Telehealth: Payer: Self-pay | Admitting: Obstetrics and Gynecology

## 2020-11-17 NOTE — Telephone Encounter (Signed)
GYN Telephone Note  Patient called and results reviewed with her and rationale for GYN oncology referral and patient is amenable to this and we will set it up.    Work note given for her and put into [place] for patient and I d/w her re: inbasket and to call us for any urgent needs  All questions asked and answered  Durene Romans MD Attending [facility] (Faculty Practice) 11/17/2020 Time: [phone]

## 2020-11-18 ENCOUNTER — Telehealth: Payer: Self-pay | Admitting: Lactation Services

## 2020-11-18 NOTE — Telephone Encounter (Signed)
-----   Message from Aletha Halim, MD sent at 11/16/2020  8:20 AM EDT ----- Please set her up to see gyn oncology asap. Referral is in. Thank you

## 2020-11-18 NOTE — Telephone Encounter (Signed)
[place] to schedule Gyn/Onc appt. No answer, LM for scheduler to return call to the office or to call the patient to schedule.

## 2020-11-19 NOTE — Telephone Encounter (Signed)
Per Chart review, no appointment at [facility] Scheduled. [place] to schedule new patient appointment. LM for Karie Mainland to call patient to schedule, informed that patient is aware they will be calling. Advised to call the office at [phone] with any further questions or concerns.

## 2020-11-19 NOTE — Telephone Encounter (Signed)
Was advised by gyn onc that they plan to schedule pt with Dr. Berline Lopes for next Friday once they confirm time with patient.  Frances Nickels  11/19/20

## 2020-11-23 ENCOUNTER — Telehealth: Payer: Self-pay | Admitting: *Deleted

## 2020-11-23 NOTE — Telephone Encounter (Signed)
Attempted to reach the patient to schedule a new patient appt, no answer and unable to leave message

## 2020-11-24 ENCOUNTER — Telehealth: Payer: Self-pay | Admitting: *Deleted

## 2020-11-24 NOTE — Telephone Encounter (Signed)
Spoke with the patient and scheduled a new patient appt for 9/16 at 9:45 am with Dr Berline Lopes. Patient given the address and phone number for the clinic; along with the policy for mask and visitors

## 2020-11-25 NOTE — Telephone Encounter (Signed)
Patient scheduled for 12/03/20.    Crystal Vasquez

## 2020-11-29 ENCOUNTER — Other Ambulatory Visit: Payer: Self-pay

## 2020-11-30 ENCOUNTER — Encounter: Payer: Self-pay | Admitting: Family Medicine

## 2020-11-30 ENCOUNTER — Ambulatory Visit (INDEPENDENT_AMBULATORY_CARE_PROVIDER_SITE_OTHER): Payer: Managed Care, Other (non HMO) | Admitting: Family Medicine

## 2020-11-30 ENCOUNTER — Other Ambulatory Visit: Payer: Self-pay

## 2020-11-30 VITALS — BP 188/100 | HR 75 | Temp 97.9°F | Resp 18 | Ht 65.98 in | Wt 180.0 lb

## 2020-11-30 DIAGNOSIS — I10 Essential (primary) hypertension: Secondary | ICD-10-CM

## 2020-11-30 DIAGNOSIS — M25512 Pain in left shoulder: Secondary | ICD-10-CM

## 2020-11-30 DIAGNOSIS — Z7689 Persons encountering health services in other specified circumstances: Secondary | ICD-10-CM

## 2020-11-30 DIAGNOSIS — E1169 Type 2 diabetes mellitus with other specified complication: Secondary | ICD-10-CM | POA: Diagnosis not present

## 2020-11-30 LAB — POCT GLYCOSYLATED HEMOGLOBIN (HGB A1C): Hemoglobin A1C: 9 % — AB (ref 4.0–5.6)

## 2020-11-30 MED ORDER — LISINOPRIL 40 MG PO TABS: 40.0000 mg | ORAL_TABLET | Freq: Every day | ORAL | 0 refills | Status: DC

## 2020-11-30 MED ORDER — METOPROLOL SUCCINATE ER 25 MG PO TB24: 25.0000 mg | ORAL_TABLET | Freq: Every day | ORAL | 0 refills | Status: DC

## 2020-11-30 MED ORDER — AMLODIPINE BESYLATE 10 MG PO TABS: 10.0000 mg | ORAL_TABLET | Freq: Every day | ORAL | 0 refills | Status: DC

## 2020-11-30 MED ORDER — GLIPIZIDE 10 MG PO TABS: 10.0000 mg | ORAL_TABLET | Freq: Two times a day (BID) | ORAL | 0 refills | Status: DC

## 2020-11-30 MED ORDER — METFORMIN HCL 1000 MG PO TABS: 1000.0000 mg | ORAL_TABLET | Freq: Two times a day (BID) | ORAL | 0 refills | Status: DC

## 2020-11-30 MED ORDER — HYDROCHLOROTHIAZIDE 25 MG PO TABS: 25.0000 mg | ORAL_TABLET | Freq: Every day | ORAL | 0 refills | Status: DC

## 2020-11-30 NOTE — Progress Notes (Signed)
New Patient Office Visit  Subjective:  Patient ID: Crystal Vasquez, female    DOB: 1970-07-07  Age: 50 y.o. MRN: [id number]  CC:  Chief Complaint  Patient presents with   Establish Care    HPI Crystal Vasquez presents for to establish care.  Patient reports that she has not had her medications for her diabetes or hypertension.  Patient ran out of meds about 5-6 months ago.  Patient also reports that she has some left shoulder pain.  She denies known trauma or injury.  She has not been taking any medications for her symptoms.  Past Medical History:  Diagnosis Date   DM2    Hypertension    Thyroid disease     History reviewed. No pertinent surgical history.  History reviewed. No pertinent family history.  Social History   Socioeconomic History   Marital status: Single    Spouse name: Not on file   Number of children: Not on file   Years of education: Not on file   Highest education level: Not on file  Occupational History   Not on file  Tobacco Use   Smoking status: Never   Smokeless tobacco: Never  Vaping Use   Vaping Use: Never used  Substance and Sexual Activity   Alcohol use: Not Currently   Drug use: Never   Sexual activity: Not Currently  Other Topics Concern   Not on file  Social History Narrative   Not on file   Social Determinants of Health   Financial Resource Strain: Not on file  Food Insecurity: Food Insecurity Present   Worried About [place] in the Last Year: Often true   Ran Out of Food in the Last Year: Sometimes true  Transportation Needs: Unmet Transportation Needs   Lack of Transportation (Medical): Yes   Lack of Transportation (Non-Medical): Yes  Physical Activity: Not on file  Stress: Not on file  Social Connections: Not on file  Intimate Partner Violence: Not on file    ROS Review of Systems  All other systems reviewed and are negative.  Objective:   Today's Vitals: BP (!) 188/100 (BP Location: Left Arm,  Patient Position: Sitting, Cuff Size: Normal)   Pulse 75   Temp 97.9 F (36.6 C)   Resp 18   Ht 5' 5.98" (1.676 m)   Wt 180 lb (81.6 kg)   SpO2 97%   BMI 29.07 kg/m   Physical Exam Vitals and nursing note reviewed.  Constitutional:      General: She is not in acute distress. Cardiovascular:     Rate and Rhythm: Normal rate and regular rhythm.  Pulmonary:     Breath sounds: Normal breath sounds.  Abdominal:     Palpations: Abdomen is soft.     Tenderness: There is no abdominal tenderness.  Musculoskeletal:     Left shoulder: Tenderness present. No swelling or deformity. Decreased range of motion.     Right lower leg: No edema.     Left lower leg: No edema.  Neurological:     General: No focal deficit present.     Mental Status: She is alert and oriented to person, place, and time.    Assessment & Plan:   1. Uncontrolled hypertension Greatly elevated blood pressure reading.  Compliance was discussed.  Meds were refilled including lisinopril 40 mg daily hydrochlorothiazide 25 mg daily and amlodipine 10 mg daily and metoprolol 25 mg daily. - lisinopril (ZESTRIL) 40 MG tablet; Take 1 tablet (40  mg total) by mouth daily.  Dispense: 90 tablet; Refill: 0 - hydrochlorothiazide (HYDRODIURIL) 25 MG tablet; Take 1 tablet (25 mg total) by mouth daily.  Dispense: 90 tablet; Refill: 0 - amLODipine (NORVASC) 10 MG tablet; Take 1 tablet (10 mg total) by mouth daily.  Dispense: 90 tablet; Refill: 0 - metoprolol succinate (TOPROL-XL) 25 MG 24 hr tablet; Take 1 tablet (25 mg total) by mouth daily.  Dispense: 90 tablet; Refill: 0  2. Type 2 diabetes mellitus with other specified complication, without long-term current use of insulin (HCC) Hemoglobin A1c is elevated above goal.  Discussed compliance.  Metformin was refilled.  Glipizide 10 mg twice daily was added to present regimen. - POCT glycosylated hemoglobin (Hb A1C) - glipiZIDE (GLUCOTROL) 10 MG tablet; Take 1 tablet (10 mg total) by mouth  2 (two) times daily before a meal.  Dispense: 180 tablet; Refill: 0 - metFORMIN (GLUCOPHAGE) 1000 MG tablet; Take 1 tablet (1,000 mg total) by mouth 2 (two) times daily with a meal.  Dispense: 180 tablet; Refill: 0  3. Left shoulder pain, unspecified chronicity Tylenol/NSAIDs as needed.  Also can utilize topical OTC preps.  4. Encounter to establish care     Outpatient Encounter Medications as of 11/30/2020  Medication Sig   amLODipine (NORVASC) 10 MG tablet Take 10 mg by mouth daily.   hydrochlorothiazide (HYDRODIURIL) 25 MG tablet Take 25 mg by mouth daily.   metFORMIN (GLUCOPHAGE) 1000 MG tablet Take 1,000 mg by mouth 2 (two) times daily with a meal.   metoprolol succinate (TOPROL-XL) 25 MG 24 hr tablet Take 25 mg by mouth daily.   olmesartan (BENICAR) 40 MG tablet Take 40 mg by mouth daily.   omeprazole (PRILOSEC) 20 MG capsule Take 20 mg by mouth daily.   ondansetron (ZOFRAN ODT) 4 MG disintegrating tablet Take 1 tablet (4 mg total) by mouth every 8 (eight) hours as needed for nausea or vomiting.   tranexamic acid (LYSTEDA) 650 MG TABS tablet Take 2 tablets (1,300 mg total) by mouth 3 (three) times daily. Take during menses for a maximum of five days   acetaminophen (TYLENOL) 325 MG tablet Take 650 mg by mouth every 6 (six) hours as needed for moderate pain.   ferrous sulfate 325 (65 FE) MG tablet Take 325 mg by mouth 2 (two) times daily with a meal.   fluticasone (VERAMYST) 27.5 MCG/SPRAY nasal spray Place 1 spray into the nose daily.   lisinopril (ZESTRIL) 40 MG tablet Take 40 mg by mouth daily. (Patient not taking: Reported on 11/11/2020)   No facility-administered encounter medications on file as of 11/30/2020.    Follow-up: Return in about 3 months (around 03/01/2021) for physical.   Becky Sax, MD

## 2020-11-30 NOTE — Progress Notes (Signed)
Pt presents to establish care, pt reports concerns with diabetes and thyroid  Pt needs refill on Zofran, Metformin

## 2020-12-02 NOTE — Progress Notes (Signed)
GYNECOLOGIC ONCOLOGY NEW PATIENT CONSULTATION   Patient Name: Crystal Vasquez  Patient Age: 50 y.o. Date of Service: 12/03/20 Referring Provider: Dr. Aletha Halim  Primary Care Provider: Dorna Mai, MD Consulting Provider: Jeral Pinch, MD   Assessment/Plan:  Likely perimenopausal patient with prolapsing mass that I suspect is a uterine fibroid.  I discussed my findings on exam with the patient. Overall, prolapsing mass appears consistent with a fibroid. Biopsy taken today. Unfortunately, I was unable to get a deeper biopsy with the true cut biopsy instrument, but I was able to get a surface biopsy. I will call the patient with these results once back next week. I have also recommended that we proceed with additional imaging to help delineate this mass and its origin. An MRI was ordered and scheduled today.   The patient has a known history of fibroids. She provided [place] with the clinic information where she think she had ultrasounds done in [place]. We had her sign a release of records and will try to obtain at least the ultrasound reports that she may have had in the last 5-10 years.   While her bleeding is likely a combination of menses as well as her dilated cervix secondary to this mass, I suggested that we start a medication to see if we can thin the lining of her uterus and decrease her bleeding. I have sent in a prescription for 10 mg of Provera. She was previously given transexamic acid by her gynecologist. We discussed that both medications can increase the risk of the VTE. I suggested that she use the TXA only if she is having significant bleeding despite being on the Provera.  In terms of definitive management, if biopsies and MRI are consistent with a benign process, we discussed that surgery will ultimately be something that we plan to proceed towards. She has both uncontrolled blood pressure as well as diabetes. I stress the importance of working on the next several  weeks to improve her glycemic control. We also discussed the possible utility of uterine artery embolization to decrease the size of this prolapsing mass if it turns out to be a fibroid.  I will call her with biopsy results as well as MRI results and we will plan next steps accordingly.  A copy of this note was sent to the patient's referring provider.   80 minutes of total time was spent for this patient encounter, including preparation, face-to-face counseling with the patient and coordination of care, and documentation of the encounter.   Jeral Pinch, MD  Division of Gynecologic Oncology  Department of Obstetrics and Gynecology  [facility], [facility], [facility]  ___________________________________________  Chief Complaint: Chief Complaint  Patient presents with   Uterine mass    History of Present Illness:  Crystal Vasquez is a 50 y.o. y.o. female who is seen in consultation at the request of Dr. Ilda Basset for an evaluation of intracervical mass versus prolapsing uterine mass.  Patient reports a history of fibroids followed previously for this by her GYN in [place].  She was seen in November of last year in [place] after going several months without a menses and then having an episode of very heavy bleeding and passage of large clots that lasted 24 to 36 hours.  Bleeding started at the gym and she had trouble leaving the bathroom because of how heavily she was bleeding.  She was found to be anemic at that time but did not require blood transfusion.  She was told at  her clinic visit that bleeding was likely related to perimenopausal changes and normal.  She remembers being told that her exam was "normal".  She thinks she had a Pap then 2 that was normal.  She then moved to [place] and had multiple months with normal menses.  Last month, she had bleeding for 12 days that she describes as heavy requiring the use of diapers which she was changing every 30 minutes.  She  also notes having passage of clots.  During this bleeding episode, she went to the emergency department at [facility].  Her hemoglobin at that time was 10.5 and recommendation was for outpatient follow-up with GYN.  Patient was not seen out patient by GYN and was noted to have a mass prolapsing into her cervix.  No biopsy was taken but a Pap was performed that showed atypical glandular cells.  She notes bleeding stopped shortly after and the last day or 2 she has had very light spotting with some fluid passage.  She denies any abdominal or pelvic pain outside of episodes of bleeding but has fairly significant cramping when she bleeds.  She also notes having fluid/discharge most of the month outside of her menses.  Over the last number of months she has had some tightness in her lower and mid abdomen.  She notes her appetite has been up and down and she endorses early satiety as well as acid reflux.  She has eating more carbohydrates because they are easier on her stomach.  She has occasional difficulty breathing when laying down at night.  She notes intermittent constipation and diarrhea which is her baseline, denies any changes recently.  She has had several more recent episodes of fecal incontinence.  She has some urinary urgency which started several years ago and has increased slowly with time.  Medical history is notable for hypertension (poorly controlled), type 2 diabetes (poorly controlled, last A1c 9.0%, just recently started back on medications and checking her sugars which have ranged from 104-314 in the last few days ), and hyperthyroidism.    Patient lives in [place] with friends.  She denies any tobacco or alcohol use.  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  She is currently working at [employer]  PAST MEDICAL HISTORY:  Past Medical History:  Diagnosis Date   DM2    Hypertension    Obesity (BMI 30-39.9)    Thyroid disease      PAST SURGICAL HISTORY:  Past  Surgical History:  Procedure Laterality Date   BIOPSY THYROID      OB/GYN HISTORY:  OB History  Gravida Para Term Preterm AB Living  3 0 0 0 3 0  SAB IAB Ectopic Multiple Live Births  1 2 0 0 0    # Outcome Date GA Lbr Len/2nd Weight Sex Delivery Anes PTL Lv  3 IAB           2 IAB           1 SAB             No LMP recorded.  Age at menarche: 31 Age at menopause: See HPI Hx of HRT: Denies  Hx of STDs: Denies Last pap: 11/11/20 - AGC, HR HPV negative History of abnormal pap smears: Reports having at some point a prior abnormal Pap and she thinks she had a biopsy of either her cervix or a cyst on her cervix.  She denies any procedures on her cervix and more  recent Paps have been normal until the 1 performed in August.  SCREENING STUDIES:  Last mammogram: 2021  Last colonoscopy: 4 to 5 years ago, had an endoscopy at the same time  MEDICATIONS: Outpatient Encounter Medications as of 12/03/2020  Medication Sig   acetaminophen (TYLENOL) 325 MG tablet Take 650 mg by mouth every 6 (six) hours as needed for moderate pain.   amLODipine (NORVASC) 10 MG tablet Take 1 tablet (10 mg total) by mouth daily.   ferrous sulfate 325 (65 FE) MG tablet Take 325 mg by mouth 2 (two) times daily with a meal.   fluticasone (VERAMYST) 27.5 MCG/SPRAY nasal spray Place 1 spray into the nose daily.   glipiZIDE (GLUCOTROL) 10 MG tablet Take 1 tablet (10 mg total) by mouth 2 (two) times daily before a meal.   hydrochlorothiazide (HYDRODIURIL) 25 MG tablet Take 1 tablet (25 mg total) by mouth daily.   lisinopril (ZESTRIL) 40 MG tablet Take 1 tablet (40 mg total) by mouth daily.   medroxyPROGESTERone (PROVERA) 10 MG tablet Take 1 tablet (10 mg total) by mouth daily.   metFORMIN (GLUCOPHAGE) 1000 MG tablet Take 1 tablet (1,000 mg total) by mouth 2 (two) times daily with a meal.   metoprolol succinate (TOPROL-XL) 25 MG 24 hr tablet Take 1 tablet (25 mg total) by mouth daily.   omeprazole (PRILOSEC) 20 MG  capsule Take 20 mg by mouth daily.   ondansetron (ZOFRAN ODT) 4 MG disintegrating tablet Take 1 tablet (4 mg total) by mouth every 8 (eight) hours as needed for nausea or vomiting.   olmesartan (BENICAR) 40 MG tablet Take 40 mg by mouth daily. (Patient not taking: Reported on 12/03/2020)   tranexamic acid (LYSTEDA) 650 MG TABS tablet Take 2 tablets (1,300 mg total) by mouth 3 (three) times daily. Take during menses for a maximum of five days (Patient not taking: Reported on 12/03/2020)   No facility-administered encounter medications on file as of 12/03/2020.    ALLERGIES:  No Known Allergies   FAMILY HISTORY:  Family History  Problem Relation Age of Onset   Endometrial cancer Maternal Aunt    Colon cancer Neg Hx    Breast cancer Neg Hx    Ovarian cancer Neg Hx    Pancreatic cancer Neg Hx    Prostate cancer Neg Hx      SOCIAL HISTORY:  Social Connections: Not on file    REVIEW OF SYSTEMS:  Pertinent positives include appetite changes, fever/chills, fatigue, shortness of breath, abdominal distention, pain, blood in stools, menstrual problems, pelvic pain, vaginal bleeding, vaginal discharge, anxiety, depression, decreased concentration. Denies unexplained weight changes. Denies hearing loss, neck lumps or masses, mouth sores, ringing in ears or voice changes. Denies cough or wheezing.   Denies chest pain or palpitations. Denies leg swelling. Denies nausea, vomiting, or early satiety. Denies joint pain, back pain or muscle pain/cramps. Denies itching, rash, or wounds. Denies dizziness, headaches, numbness or seizures. Denies swollen lymph nodes or glands, denies easy bruising or bleeding. Denies confusion.  Physical Exam:  Vital Signs for this encounter:  Blood pressure (!) 178/89, pulse 62, temperature 97.9 F (36.6 C), temperature source Tympanic, resp. rate 18, height 5\' 5"  (1.651 m), weight 188 lb 12.8 oz (85.6 kg), SpO2 100 %. Body mass index is 31.42 kg/m. General:  Alert, oriented, no acute distress.  HEENT: Normocephalic, atraumatic. Sclera anicteric.  Chest: Clear to auscultation bilaterally. No wheezes, rhonchi, or rales. Cardiovascular: Regular rate and rhythm, no murmurs, rubs, or gallops.  Abdomen:  Normoactive bowel sounds. Soft, nondistended, nontender to palpation. No masses or hepatosplenomegaly appreciated. No palpable fluid wave.  Extremities: Grossly normal range of motion. Warm, well perfused. No edema bilaterally.  Skin: No rashes or lesions.  Lymphatics: No cervical, supraclavicular, or inguinal adenopathy.  GU:  Normal external female genitalia.  No lesions. No discharge or bleeding.             Bladder/urethra:  No lesions or masses, well supported bladder             Vagina: Well rugated, no masses or lesions.             Cervix: Normal appearing, no lesions.  Dilated approximately 3 cm with mass prolapsing up to but not through the external os.  This mass is smooth, small 1 cm hematoma noted on the inferior aspect.             Uterus: 10 cm, moderately mobile, no nodularity or parametrial involvement appreciated however the mass causes dilation of the cervix and lower uterine segment significantly, approximately 8 cm.             Adnexa: No masses appreciated.  Rectal: Confirms above findings.  Prolapsing mass biopsy: Preoperative diagnosis: Suspected prolapsing fibroid versus endocervical mass Postoperative diagnosis: Same as above Physician: Berline Lopes MD Estimated blood loss: 25 cc Specimens: Prolapsing mass biopsy Procedure: Procedure was discussed with the patient including risks and benefits and she gave verbal consent.  She was then placed in dorsolithotomy position and a speculum was placed in the vagina.  Once the cervix was well visualized, Betadine was used x3 to cleanse the prolapsing mass.  A biopsy was taken with Tischler forceps.  Multiple attempts were made to biopsy the mass at a deeper level with Tru-Cut biopsy.  Given the  firmness of the mass, this biopsy was unsuccessful.  Hemostasis was achieved with Monsel's and pressure.  All instruments were removed from the vagina.  Overall she tolerated it well.  LABORATORY AND RADIOLOGIC DATA:  Outside medical records were reviewed to synthesize the above history, along with the history and physical obtained during the visit.   Lab Results  Component Value Date   WBC 5.8 11/11/2020   HGB 9.3 (L) 11/11/2020   HCT 28.5 (L) 11/11/2020   PLT 430 11/11/2020   GLUCOSE 358 (H) 10/27/2020   ALT 17 10/27/2020   AST 16 10/27/2020   NA 136 10/27/2020   K 3.6 10/27/2020   CL 99 10/27/2020   CREATININE 0.70 10/27/2020   BUN 9 10/27/2020   CO2 25 10/27/2020   HGBA1C 9.0 (A) 11/30/2020   Pelvic ultrasound 8/29: Submucosal leiomyoma at upper uterus 5.5 cm diameter.   Additional 7.5 x 3.5 x 4.9 cm diameter suspected mass at lower uterine segment/cervix associated with complex fluid collection within the endometrial canal above the mass.   This mass could represent a cervical neoplasm, cervical leiomyoma, less likely endometrial polyp or myometrial contraction; further characterization of this mass lesion by MR imaging with and without contrast recommended.

## 2020-12-03 ENCOUNTER — Other Ambulatory Visit: Payer: Self-pay

## 2020-12-03 ENCOUNTER — Encounter: Payer: Self-pay | Admitting: Gynecologic Oncology

## 2020-12-03 ENCOUNTER — Inpatient Hospital Stay: Payer: Managed Care, Other (non HMO) | Attending: Gynecologic Oncology | Admitting: Gynecologic Oncology

## 2020-12-03 VITALS — BP 178/89 | HR 62 | Temp 97.9°F | Resp 18 | Ht 65.0 in | Wt 188.8 lb

## 2020-12-03 DIAGNOSIS — Z7984 Long term (current) use of oral hypoglycemic drugs: Secondary | ICD-10-CM | POA: Insufficient documentation

## 2020-12-03 DIAGNOSIS — Z86018 Personal history of other benign neoplasm: Secondary | ICD-10-CM | POA: Diagnosis not present

## 2020-12-03 DIAGNOSIS — R3915 Urgency of urination: Secondary | ICD-10-CM | POA: Diagnosis not present

## 2020-12-03 DIAGNOSIS — Z79899 Other long term (current) drug therapy: Secondary | ICD-10-CM | POA: Diagnosis not present

## 2020-12-03 DIAGNOSIS — E1165 Type 2 diabetes mellitus with hyperglycemia: Secondary | ICD-10-CM | POA: Diagnosis not present

## 2020-12-03 DIAGNOSIS — I10 Essential (primary) hypertension: Secondary | ICD-10-CM

## 2020-12-03 DIAGNOSIS — K219 Gastro-esophageal reflux disease without esophagitis: Secondary | ICD-10-CM | POA: Diagnosis not present

## 2020-12-03 DIAGNOSIS — R197 Diarrhea, unspecified: Secondary | ICD-10-CM | POA: Insufficient documentation

## 2020-12-03 DIAGNOSIS — K5900 Constipation, unspecified: Secondary | ICD-10-CM | POA: Insufficient documentation

## 2020-12-03 DIAGNOSIS — R159 Full incontinence of feces: Secondary | ICD-10-CM | POA: Diagnosis not present

## 2020-12-03 DIAGNOSIS — R6881 Early satiety: Secondary | ICD-10-CM | POA: Diagnosis not present

## 2020-12-03 DIAGNOSIS — E1159 Type 2 diabetes mellitus with other circulatory complications: Secondary | ICD-10-CM | POA: Insufficient documentation

## 2020-12-03 DIAGNOSIS — D390 Neoplasm of uncertain behavior of uterus: Secondary | ICD-10-CM | POA: Insufficient documentation

## 2020-12-03 DIAGNOSIS — N858 Other specified noninflammatory disorders of uterus: Secondary | ICD-10-CM | POA: Insufficient documentation

## 2020-12-03 MED ORDER — MEDROXYPROGESTERONE ACETATE 10 MG PO TABS: 10.0000 mg | ORAL_TABLET | Freq: Every day | ORAL | 2 refills | Status: DC

## 2020-12-03 NOTE — Patient Instructions (Addendum)
It was a pleasure meeting you today.  I will hopefully get the biopsy results back early next week and will call you when I do.  Once we have the MRI, we will discuss next steps.  As we discussed today, I suspect that this is a prolapsing uterine fibroid.  My office will work on getting at least the reports from prior ultrasounds to help establish where you are fibroid was on the last imaging that you had.  As surgery at some point in the future is likely, please work on continuing to get your blood sugars under better control.  This will be very important in terms of making surgery itself safe and in decreasing the risk of complications after surgery.  I am going to send a prescription for Provera, which is a progesterone pill, to your pharmacy to help decrease her bleeding.  With this mass holding the cervix open, you will still likely have some bleeding, but I hope that the medication will help.  Please start taking this once you pick it up.  The medication that Dr. Ilda Basset gave you, tranexamic acid, can be something you use if you start having bleeding and it becomes very heavy.  Both of these medications can slightly increase your risk of blood clot, so we typically try to avoid giving them together.

## 2020-12-06 LAB — SURGICAL PATHOLOGY

## 2020-12-11 ENCOUNTER — Ambulatory Visit (HOSPITAL_COMMUNITY): Payer: Managed Care, Other (non HMO)

## 2020-12-14 ENCOUNTER — Encounter: Payer: Self-pay | Admitting: Gynecologic Oncology

## 2020-12-19 ENCOUNTER — Other Ambulatory Visit: Payer: Self-pay

## 2020-12-19 ENCOUNTER — Ambulatory Visit
Admission: RE | Admit: 2020-12-19 | Discharge: 2020-12-19 | Disposition: A | Discharge status: Home / Self Care | Payer: Managed Care, Other (non HMO) | Source: Ambulatory Visit | Attending: Gynecologic Oncology | Admitting: Gynecologic Oncology

## 2020-12-19 DIAGNOSIS — N858 Other specified noninflammatory disorders of uterus: Secondary | ICD-10-CM

## 2020-12-19 MED ORDER — GADOBENATE DIMEGLUMINE 529 MG/ML IV SOLN
17.0000 mL | Freq: Once | INTRAVENOUS | Status: AC | PRN
  Administered 2020-12-19: 17 mL via INTRAVENOUS

## 2020-12-22 ENCOUNTER — Telehealth: Payer: Self-pay

## 2020-12-22 NOTE — Telephone Encounter (Signed)
Attempted to reach Crystal Vasquez re: hemoglobin A1C. Phone number not in service.

## 2020-12-23 ENCOUNTER — Encounter: Payer: Self-pay | Admitting: Gynecologic Oncology

## 2021-01-03 ENCOUNTER — Encounter: Payer: Self-pay | Admitting: Gynecologic Oncology

## 2021-01-11 ENCOUNTER — Telehealth: Payer: Self-pay

## 2021-01-11 NOTE — Telephone Encounter (Signed)
Received call from [place], patient has not had access to a telephone. Her friend is letting her use her phone in the meantime, phone number [phone]. Patient states she is switching doctors and does not currently have an endocrinologist. She is taking metformin and glipizide. She reports her blood sugars have been under 200. Explained to patient that in order to proceed with surgery A1C must be less than 8. Patient needs to work with PCP or endocrinology in order to attain this. Patient verbalized understanding.  Telephone visit scheduled with Dr. Berline Lopes for 01/12/21 at 3:15pm to discuss treatment plan. Patient is in agreement of date and time of appointment. Patient to call clinic when she has access to phone tomorrow.

## 2021-01-12 ENCOUNTER — Inpatient Hospital Stay: Payer: Managed Care, Other (non HMO) | Attending: Gynecologic Oncology | Admitting: Gynecologic Oncology

## 2021-01-12 ENCOUNTER — Other Ambulatory Visit: Payer: Self-pay

## 2021-01-12 ENCOUNTER — Encounter: Payer: Self-pay | Admitting: Gynecologic Oncology

## 2021-01-12 DIAGNOSIS — N858 Other specified noninflammatory disorders of uterus: Secondary | ICD-10-CM

## 2021-01-12 DIAGNOSIS — N939 Abnormal uterine and vaginal bleeding, unspecified: Secondary | ICD-10-CM | POA: Diagnosis not present

## 2021-01-12 NOTE — Progress Notes (Signed)
Gynecologic Oncology Telehealth Consult Note: Gyn-Onc  I connected with [name] on 01/12/21 at  3:15 PM EDT by telephone and verified that I am speaking with the correct person using two identifiers.  I discussed the limitations, risks, security and privacy concerns of performing an evaluation and management service by telemedicine and the availability of in-person appointments. I also discussed with the patient that there may be a patient responsible charge related to this service. The patient expressed understanding and agreed to proceed.  Other persons participating in the visit and their role in the encounter: None.  Patient's location: Home Provider's location: [facility]  Reason for Visit: Follow-up discussion, treatment planning  Treatment History: 12/03/2020: Referred to see me initially for prolapsing uterine versus intracervical mass. Biopsies of the mass showed acutely inflamed glandular mucosal tissue, no malignancy.  Findings may represent endometrial polyp prolapsing through the cervix. 12/19/2020: MRI of the pelvis confirms 10 x 4 cm heterogenous mass in the endometrial canal that extends to the cervical os.  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  Scattered uterine leiomyomata are also noted.  Interval History: Patient reports overall doing well since her visit with me.  She feels significantly better on progesterone and iron.  Her bleeding has slowed significantly but she missed a lot of work secondary to bleeding and bleeding through her clothing.  She has all of her necessary medications now and is working hard to improve her glucose control.  Her blood sugars are more frequently in the range of 190.  She is also increasing her activity.  She currently has a part-time job but is looking for a full-time job.  She would like to be able to work from home.  Past Medical/Surgical History: Past Medical History:  Diagnosis Date    DM2    Hypertension    Obesity (BMI 30-39.9)    Thyroid disease     Past Surgical History:  Procedure Laterality Date   BIOPSY THYROID      Family History  Problem Relation Age of Onset   Endometrial cancer Maternal Aunt    Colon cancer Neg Hx    Breast cancer Neg Hx    Ovarian cancer Neg Hx    Pancreatic cancer Neg Hx    Prostate cancer Neg Hx     Social History   Socioeconomic History   Marital status: Single    Spouse name: Not on file   Number of children: Not on file   Years of education: Not on file   Highest education level: Not on file  Occupational History   Not on file  Tobacco Use   Smoking status: Never   Smokeless tobacco: Never  Vaping Use   Vaping Use: Never used  Substance and Sexual Activity   Alcohol use: Not Currently   Drug use: Never   Sexual activity: Not Currently  Other Topics Concern   Not on file  Social History Narrative   Not on file   Social Determinants of Health   Financial Resource Strain: Not on file  Food Insecurity: Food Insecurity Present   Worried About [place] in the Last Year: Often true   Ran Out of Food in the Last Year: Sometimes true  Transportation Needs: Unmet Transportation Needs   Lack of Transportation (Medical): Yes   Lack of Transportation (Non-Medical): Yes  Physical Activity: Not on file  Stress: Not on file  Social Connections: Not on file    Current  Medications:  Current Outpatient Medications:    acetaminophen (TYLENOL) 325 MG tablet, Take 650 mg by mouth every 6 (six) hours as needed for moderate pain., Disp: , Rfl:    amLODipine (NORVASC) 10 MG tablet, Take 1 tablet (10 mg total) by mouth daily., Disp: 90 tablet, Rfl: 0   ferrous sulfate 325 (65 FE) MG tablet, Take 325 mg by mouth 2 (two) times daily with a meal., Disp: , Rfl:    fluticasone (VERAMYST) 27.5 MCG/SPRAY nasal spray, Place 1 spray into the nose daily., Disp: , Rfl:    glipiZIDE (GLUCOTROL) 10 MG tablet, Take 1 tablet (10  mg total) by mouth 2 (two) times daily before a meal., Disp: 180 tablet, Rfl: 0   hydrochlorothiazide (HYDRODIURIL) 25 MG tablet, Take 1 tablet (25 mg total) by mouth daily., Disp: 90 tablet, Rfl: 0   lisinopril (ZESTRIL) 40 MG tablet, Take 1 tablet (40 mg total) by mouth daily., Disp: 90 tablet, Rfl: 0   medroxyPROGESTERone (PROVERA) 10 MG tablet, Take 1 tablet (10 mg total) by mouth daily., Disp: 30 tablet, Rfl: 2   metFORMIN (GLUCOPHAGE) 1000 MG tablet, Take 1 tablet (1,000 mg total) by mouth 2 (two) times daily with a meal., Disp: 180 tablet, Rfl: 0   metoprolol succinate (TOPROL-XL) 25 MG 24 hr tablet, Take 1 tablet (25 mg total) by mouth daily., Disp: 90 tablet, Rfl: 0   olmesartan (BENICAR) 40 MG tablet, Take 40 mg by mouth daily. (Patient not taking: Reported on 12/03/2020), Disp: , Rfl:    omeprazole (PRILOSEC) 20 MG capsule, Take 20 mg by mouth daily., Disp: , Rfl:    ondansetron (ZOFRAN ODT) 4 MG disintegrating tablet, Take 1 tablet (4 mg total) by mouth every 8 (eight) hours as needed for nausea or vomiting., Disp: 20 tablet, Rfl: 0   tranexamic acid (LYSTEDA) 650 MG TABS tablet, Take 2 tablets (1,300 mg total) by mouth 3 (three) times daily. Take during menses for a maximum of five days (Patient not taking: Reported on 12/03/2020), Disp: 30 tablet, Rfl: 2  Review of Symptoms: Pertinent positives as per HPI.  Physical Exam: There were no vitals taken for this visit. Deferred given limitations of phone visit.  Laboratory & Radiologic Studies: None new  Assessment & Plan: Crystal Vasquez is a 50 y.o. woman with intracervical mass, suspected to be either prolapsing endometrial polyp or fibroid.  I have been unable to reach the patient for some time after her MRI was performed.  She has been without reliable phone service but is hoping to have her own phone again starting this weekend.  We reviewed biopsy results as well as MRI findings, which both point towards a benign etiology  for prolapsing mass.  Ideally, I would like to move forward with surgery both to help stop her bleeding and resultant anemia as well as other symptoms.  Given poorly controlled diabetes, we had discussed getting her blood sugars below 180 before moving forward with definitive surgery.  She is very motivated to do this and has already seen significant improvement in her hyperglycemia.  We reviewed again that once blood sugars are under better control, my recommendation would be to have her undergo uterine artery embolization followed by short interval hysterectomy (likely about 2 weeks after).  I offered to make her a follow-up phone visit versus that she could call the office when her glucose control has further improved.  Her preference is to call when she is ready to move forward with scheduling surgery.  I discussed the assessment and treatment plan with the patient. The patient was provided with an opportunity to ask questions and all were answered. The patient agreed with the plan and demonstrated an understanding of the instructions.   The patient was advised to call back or see an in-person evaluation if the symptoms worsen or if the condition fails to improve as anticipated.   22 minutes of total time was spent for this patient encounter, including preparation, face-to-face counseling with the patient and coordination of care, and documentation of the encounter.   Jeral Pinch, MD  Division of Gynecologic Oncology  Department of Obstetrics and Gynecology  [facility]

## 2021-02-17 ENCOUNTER — Ambulatory Visit (INDEPENDENT_AMBULATORY_CARE_PROVIDER_SITE_OTHER): Payer: Managed Care, Other (non HMO) | Admitting: Family Medicine

## 2021-02-17 ENCOUNTER — Other Ambulatory Visit: Payer: Self-pay

## 2021-02-17 VITALS — BP 132/81 | HR 80 | Temp 98.2°F | Resp 16 | Wt 180.0 lb

## 2021-02-17 DIAGNOSIS — I10 Essential (primary) hypertension: Secondary | ICD-10-CM

## 2021-02-17 DIAGNOSIS — E1169 Type 2 diabetes mellitus with other specified complication: Secondary | ICD-10-CM

## 2021-02-17 DIAGNOSIS — Z8639 Personal history of other endocrine, nutritional and metabolic disease: Secondary | ICD-10-CM

## 2021-02-17 DIAGNOSIS — F419 Anxiety disorder, unspecified: Secondary | ICD-10-CM | POA: Diagnosis not present

## 2021-02-17 DIAGNOSIS — F32A Depression, unspecified: Secondary | ICD-10-CM

## 2021-02-17 DIAGNOSIS — M25512 Pain in left shoulder: Secondary | ICD-10-CM

## 2021-02-17 LAB — POCT GLYCOSYLATED HEMOGLOBIN (HGB A1C): Hemoglobin A1C: 6.7 % — AB (ref 4.0–5.6)

## 2021-02-17 MED ORDER — ESCITALOPRAM OXALATE 10 MG PO TABS: 10.0000 mg | ORAL_TABLET | Freq: Every day | ORAL | 0 refills | Status: DC

## 2021-02-17 NOTE — Progress Notes (Signed)
Patient is here to talk about her depression and lab work .

## 2021-02-18 ENCOUNTER — Other Ambulatory Visit: Payer: Self-pay | Admitting: Family Medicine

## 2021-02-18 ENCOUNTER — Telehealth: Payer: Managed Care, Other (non HMO) | Admitting: Physician Assistant

## 2021-02-18 ENCOUNTER — Encounter: Payer: Self-pay | Admitting: Family Medicine

## 2021-02-18 DIAGNOSIS — B9689 Other specified bacterial agents as the cause of diseases classified elsewhere: Secondary | ICD-10-CM

## 2021-02-18 DIAGNOSIS — N760 Acute vaginitis: Secondary | ICD-10-CM

## 2021-02-18 DIAGNOSIS — E1169 Type 2 diabetes mellitus with other specified complication: Secondary | ICD-10-CM

## 2021-02-18 DIAGNOSIS — I10 Essential (primary) hypertension: Secondary | ICD-10-CM

## 2021-02-18 LAB — T4, FREE: Free T4: 1.19 ng/dL (ref 0.82–1.77)

## 2021-02-18 LAB — TSH: TSH: 0.58 u[IU]/mL (ref 0.450–4.500)

## 2021-02-18 MED ORDER — LISINOPRIL 40 MG PO TABS: 40.0000 mg | ORAL_TABLET | Freq: Every day | ORAL | 0 refills | Status: DC

## 2021-02-18 MED ORDER — HYDROCHLOROTHIAZIDE 25 MG PO TABS: 25.0000 mg | ORAL_TABLET | Freq: Every day | ORAL | 0 refills | Status: DC

## 2021-02-18 MED ORDER — METFORMIN HCL 1000 MG PO TABS: 1000.0000 mg | ORAL_TABLET | Freq: Two times a day (BID) | ORAL | 0 refills | Status: DC

## 2021-02-18 MED ORDER — GLIPIZIDE 10 MG PO TABS: 10.0000 mg | ORAL_TABLET | Freq: Two times a day (BID) | ORAL | 0 refills | Status: DC

## 2021-02-18 MED ORDER — METOPROLOL SUCCINATE ER 25 MG PO TB24: 25.0000 mg | ORAL_TABLET | Freq: Every day | ORAL | 0 refills | Status: DC

## 2021-02-18 MED ORDER — METRONIDAZOLE 500 MG PO TABS: 500.0000 mg | ORAL_TABLET | Freq: Two times a day (BID) | ORAL | 0 refills | Status: AC

## 2021-02-18 MED ORDER — AMLODIPINE BESYLATE 10 MG PO TABS: 10.0000 mg | ORAL_TABLET | Freq: Every day | ORAL | 0 refills | Status: DC

## 2021-02-18 NOTE — Progress Notes (Signed)
Established Patient Office Visit  Subjective:  Patient ID: Crystal Vasquez, female    DOB: [birth year]  Age: 50 y.o. MRN: [id number]  CC:  Chief Complaint  Patient presents with   Follow-up   Hypertension   Diabetes   Depression    HPI Crystal Vasquez presents for follow up of blood pressure. Patient also reports that she would like now to be started on a med to improve her mood as discussed previously. She also reports that her left shoulder continues to hinder her and is not improving.   Past Medical History:  Diagnosis Date   DM2    Hypertension    Obesity (BMI 30-39.9)    Thyroid disease     Past Surgical History:  Procedure Laterality Date   BIOPSY THYROID      Family History  Problem Relation Age of Onset   Endometrial cancer Maternal Aunt    Colon cancer Neg Hx    Breast cancer Neg Hx    Ovarian cancer Neg Hx    Pancreatic cancer Neg Hx    Prostate cancer Neg Hx     Social History   Socioeconomic History   Marital status: Single    Spouse name: Not on file   Number of children: Not on file   Years of education: Not on file   Highest education level: Not on file  Occupational History   Not on file  Tobacco Use   Smoking status: Never   Smokeless tobacco: Never  Vaping Use   Vaping Use: Never used  Substance and Sexual Activity   Alcohol use: Not Currently   Drug use: Never   Sexual activity: Not Currently  Other Topics Concern   Not on file  Social History Narrative   Not on file   Social Determinants of Health   Financial Resource Strain: Not on file  Food Insecurity: Food Insecurity Present   Worried About [place] in the Last Year: Often true   Ran Out of Food in the Last Year: Sometimes true  Transportation Needs: Unmet Transportation Needs   Lack of Transportation (Medical): Yes   Lack of Transportation (Non-Medical): Yes  Physical Activity: Not on file  Stress: Not on file  Social Connections: Not on file   Intimate Partner Violence: Not on file    ROS Review of Systems  Psychiatric/Behavioral:  Positive for sleep disturbance. Negative for self-injury and suicidal ideas. The patient is nervous/anxious.   All other systems reviewed and are negative.  Objective:   Today's Vitals: BP 132/81   Pulse 80   Temp 98.2 F (36.8 C) (Oral)   Resp 16   Wt 180 lb (81.6 kg)   BMI 29.95 kg/m   Physical Exam Vitals and nursing note reviewed.  Constitutional:      General: She is not in acute distress. Cardiovascular:     Rate and Rhythm: Normal rate and regular rhythm.  Pulmonary:     Effort: Pulmonary effort is normal.     Breath sounds: Normal breath sounds.  Abdominal:     Palpations: Abdomen is soft.     Tenderness: There is no abdominal tenderness.  Musculoskeletal:     Left shoulder: Tenderness present. No swelling or deformity. Decreased range of motion.     Cervical back: Normal range of motion and neck supple.     Right lower leg: No edema.     Left lower leg: No edema.  Neurological:     General:  No focal deficit present.     Mental Status: She is alert and oriented to person, place, and time.  Psychiatric:        Mood and Affect: Affect normal. Mood is anxious.    Assessment & Plan:   1. Type 2 diabetes mellitus with other specified complication, without long-term current use of insulin (HCC) Greatly imp[roved A1c and now at goal. Continue present management and monitor - POCT glycosylated hemoglobin (Hb A1C)  2. Essential hypertension Continue present management and monitor  3. History of thyroid disease Monitoring labs ordered.  - TSH - T4, Free  4. Anxiety and depression Lexapro 10 mg daily prescribed. Will monitor  5. Left shoulder pain, unspecified chronicity ?frozen shoulder. Referral to ortho for further eval/mgt - Ambulatory referral to Orthopedic Surgery    Outpatient Encounter Medications as of 02/17/2021  Medication Sig   acetaminophen (TYLENOL)  325 MG tablet Take 650 mg by mouth every 6 (six) hours as needed for moderate pain.   amLODipine (NORVASC) 10 MG tablet Take 1 tablet (10 mg total) by mouth daily.   escitalopram (LEXAPRO) 10 MG tablet Take 1 tablet (10 mg total) by mouth daily.   ferrous sulfate 325 (65 FE) MG tablet Take 325 mg by mouth 2 (two) times daily with a meal.   fluticasone (VERAMYST) 27.5 MCG/SPRAY nasal spray Place 1 spray into the nose daily.   glipiZIDE (GLUCOTROL) 10 MG tablet Take 1 tablet (10 mg total) by mouth 2 (two) times daily before a meal.   hydrochlorothiazide (HYDRODIURIL) 25 MG tablet Take 1 tablet (25 mg total) by mouth daily.   lisinopril (ZESTRIL) 40 MG tablet Take 1 tablet (40 mg total) by mouth daily.   medroxyPROGESTERone (PROVERA) 10 MG tablet Take 1 tablet (10 mg total) by mouth daily.   metFORMIN (GLUCOPHAGE) 1000 MG tablet Take 1 tablet (1,000 mg total) by mouth 2 (two) times daily with a meal.   metoprolol succinate (TOPROL-XL) 25 MG 24 hr tablet Take 1 tablet (25 mg total) by mouth daily.   olmesartan (BENICAR) 40 MG tablet Take 40 mg by mouth daily.   omeprazole (PRILOSEC) 20 MG capsule Take 20 mg by mouth daily.   ondansetron (ZOFRAN ODT) 4 MG disintegrating tablet Take 1 tablet (4 mg total) by mouth every 8 (eight) hours as needed for nausea or vomiting.   tranexamic acid (LYSTEDA) 650 MG TABS tablet Take 2 tablets (1,300 mg total) by mouth 3 (three) times daily. Take during menses for a maximum of five days   No facility-administered encounter medications on file as of 02/17/2021.    Follow-up: No follow-ups on file.   Becky Sax, MD

## 2021-02-18 NOTE — Progress Notes (Signed)

## 2021-02-22 ENCOUNTER — Ambulatory Visit (INDEPENDENT_AMBULATORY_CARE_PROVIDER_SITE_OTHER): Payer: Managed Care, Other (non HMO) | Admitting: Orthopaedic Surgery

## 2021-02-22 ENCOUNTER — Other Ambulatory Visit: Payer: Self-pay

## 2021-02-22 ENCOUNTER — Other Ambulatory Visit (HOSPITAL_BASED_OUTPATIENT_CLINIC_OR_DEPARTMENT_OTHER): Payer: Self-pay | Admitting: Orthopaedic Surgery

## 2021-02-22 ENCOUNTER — Ambulatory Visit (HOSPITAL_BASED_OUTPATIENT_CLINIC_OR_DEPARTMENT_OTHER)
Admission: RE | Admit: 2021-02-22 | Discharge: 2021-02-22 | Disposition: A | Discharge status: Home / Self Care | Payer: Managed Care, Other (non HMO) | Source: Ambulatory Visit | Attending: Orthopaedic Surgery | Admitting: Orthopaedic Surgery

## 2021-02-22 DIAGNOSIS — M7502 Adhesive capsulitis of left shoulder: Secondary | ICD-10-CM | POA: Diagnosis not present

## 2021-02-22 DIAGNOSIS — M25512 Pain in left shoulder: Secondary | ICD-10-CM | POA: Insufficient documentation

## 2021-02-22 MED ORDER — TRIAMCINOLONE ACETONIDE 40 MG/ML IJ SUSP
80.0000 mg | INTRAMUSCULAR | Status: AC | PRN
Start: 2021-02-22 — End: 2021-02-22
  Administered 2021-02-22: 80 mg via INTRA_ARTICULAR

## 2021-02-22 MED ORDER — LIDOCAINE HCL 1 % IJ SOLN
4.0000 mL | INTRAMUSCULAR | Status: AC | PRN
Start: 2021-02-22 — End: 2021-02-22
  Administered 2021-02-22: 4 mL

## 2021-02-22 NOTE — BH Specialist Note (Addendum)
Integrated Behavioral Health via Telemedicine Visit  02/22/2021 Crystal Vasquez [id number]  Number of [place] visits: 1 Session Start time: 8:16  Session End time: 8:31 Total time: 15  Referring Provider: Aletha Halim, MD Patient/Family location: Home [facility] Provider location: [facility] for [place] [facility]  All persons participating in visit: Patient Crystal Vasquez and Crystal Vasquez   Types of Service: Individual psychotherapy and Video visit  I connected with [place] and/or Omega A Bottcher's  n/a  via  Telephone or Geologist, engineering  (Video is Caregility application) and verified that I am speaking with the correct person using two identifiers. Discussed confidentiality: Yes   I discussed the limitations of telemedicine and the availability of in person appointments.  Discussed there is a possibility of technology failure and discussed alternative modes of communication if that failure occurs.  I discussed that engaging in this telemedicine visit, they consent to the provision of behavioral healthcare and the services will be billed under their insurance.  Patient and/or legal guardian expressed understanding and consented to Telemedicine visit: Yes   Presenting Concerns: Patient and/or family reports the following symptoms/concerns: Ongoing life stress, including many health issues and difficult housing situation, contributing to escalation of anxiety; pt copes using deep breathing exercises and self-advocacy as self-care. Also worry that family members may have had undiagnosed tumors that contributed to their deaths, including suicide. Pt does NOT endorse SI, but worries about how tumors may affect her mental health over time.  Duration of problem: Increasing over time; Severity of problem:  moderately severe  Patient and/or Family's Strengths/Protective Factors: Sense of  purpose  Goals Addressed: Patient will:  Reduce symptoms of: anxiety, depression, and stress   Increase knowledge and/or ability of: healthy habits and stress reduction   Demonstrate ability to: Increase healthy adjustment to current life circumstances  Progress towards Goals: Ongoing  Interventions: Interventions utilized:  Solution-Focused Strategies and Psychoeducation and/or Health Education Standardized Assessments completed:  PHQ9/GAD7 given within last two weeks  Patient and/or Family Response: Pt agrees with treatment plan  Assessment: Patient currently experiencing Anxiety disorder, unspecified and Psychosocial stress.   Patient may benefit from psychoeducation and brief therapeutic interventions regarding coping with symptoms of anxiety, depression, stress .  Plan: Follow up with behavioral health clinician on : Three weeks Behavioral recommendations:  -Continue using deep breathing exercises daily, as needed -Consider adding sleep sounds, as discussed, for improved quality of sleep and creating more peaceful sleep environment -Complete [employer] paperwork (Call Roselyn Reef at [phone] if not received within one week) -Continue self-advocacy for your own health in working with medical providers as a team, to work towards overall wellness -Although NOT suicidal, use [facility], as needed, if SI begins 24/7 Referral(s): [place] (In Clinic)  I discussed the assessment and treatment plan with the patient and/or parent/guardian. They were provided an opportunity to ask questions and all were answered. They agreed with the plan and demonstrated an understanding of the instructions.   They were advised to call back or seek an in-person evaluation if the symptoms worsen or if the condition fails to improve as anticipated.  Garlan Fair, LCSW  Depression screen [facility] 2/9 02/17/2021 11/11/2020  Decreased Interest 1 1  Down, Depressed,  Hopeless 1 1  PHQ - 2 Score 2 2  Altered sleeping 3 0  Tired, decreased energy 1 3  Change in appetite 2 2  Feeling bad or  failure about yourself  0 0  Trouble concentrating 3 2  Moving slowly or fidgety/restless - 1  Suicidal thoughts - 0  PHQ-9 Score 11 10  Difficult doing work/chores Not difficult at all -   GAD 7 : Generalized Anxiety Score 11/11/2020  Nervous, Anxious, on Edge 3  Control/stop worrying 2  Worry too much - different things 2  Trouble relaxing 3  Restless 2  Easily annoyed or irritable 3  Afraid - awful might happen 0  Total GAD 7 Score 15

## 2021-02-22 NOTE — Progress Notes (Signed)
Chief Complaint: Left shoulder pain     History of Present Illness:    Crystal Vasquez is a 50 y.o. female right hand dominant female with left shoulder pain going on now for 4 to 5 months.  She did not have any specific injury or accident.  She states that she saw her primary care doctor who recommended surgery ultimately for frozen shoulder.  She continues to have pain and throbbing about the shoulder.  She is unable to lay directly on the side.  She currently works as a Producer, television/film/video at [employer]  This resulted in heavy lifting that has been significantly difficult for her.  She does have a history of thyroid disease as well as diabetes.  She has not had any injections prior    Surgical History:   None  PMH/PSH/Family History/Social History/Meds/Allergies:    Past Medical History:  Diagnosis Date  . DM2   . Hypertension   . Obesity (BMI 30-39.9)   . Thyroid disease    Past Surgical History:  Procedure Laterality Date  . BIOPSY THYROID     Social History   Socioeconomic History  . Marital status: Single    Spouse name: Not on file  . Number of children: Not on file  . Years of education: Not on file  . Highest education level: Not on file  Occupational History  . Not on file  Tobacco Use  . Smoking status: Never  . Smokeless tobacco: Never  Vaping Use  . Vaping Use: Never used  Substance and Sexual Activity  . Alcohol use: Not Currently  . Drug use: Never  . Sexual activity: Not Currently  Other Topics Concern  . Not on file  Social History Narrative  . Not on file   Social Determinants of Health   Financial Resource Strain: Not on file  Food Insecurity: Food Insecurity Present  . Worried About [place] fundraiser in the Last Year: Often true  . Ran Out of Food in the Last Year: Sometimes true  Transportation Needs: Unmet Transportation Needs  . Lack of Transportation (Medical): Yes  . Lack of  Transportation (Non-Medical): Yes  Physical Activity: Not on file  Stress: Not on file  Social Connections: Not on file   Family History  Problem Relation Age of Onset  . Endometrial cancer Maternal Aunt   . Colon cancer Neg Hx   . Breast cancer Neg Hx   . Ovarian cancer Neg Hx   . Pancreatic cancer Neg Hx   . Prostate cancer Neg Hx    No Known Allergies Current Outpatient Medications  Medication Sig Dispense Refill  . acetaminophen (TYLENOL) 325 MG tablet Take 650 mg by mouth every 6 (six) hours as needed for moderate pain.    Marland Kitchen amLODipine (NORVASC) 10 MG tablet Take 1 tablet (10 mg total) by mouth daily. 90 tablet 0  . ferrous sulfate 325 (65 FE) MG tablet Take 325 mg by mouth 2 (two) times daily with a meal.    . fluticasone (VERAMYST) 27.5 MCG/SPRAY nasal spray Place 1 spray into the nose daily.    Marland Kitchen glipiZIDE (GLUCOTROL) 10 MG tablet Take 1 tablet (10 mg total) by mouth 2 (two) times daily before a meal. 180 tablet 0  . hydrochlorothiazide (HYDRODIURIL) 25 MG tablet Take 1  tablet (25 mg total) by mouth daily. 90 tablet 0  . lisinopril (ZESTRIL) 40 MG tablet Take 1 tablet (40 mg total) by mouth daily. 90 tablet 0  . medroxyPROGESTERone (PROVERA) 10 MG tablet Take 1 tablet (10 mg total) by mouth daily. 30 tablet 2  . metFORMIN (GLUCOPHAGE) 1000 MG tablet Take 1 tablet (1,000 mg total) by mouth 2 (two) times daily with a meal. 180 tablet 0  . metoprolol succinate (TOPROL-XL) 25 MG 24 hr tablet Take 1 tablet (25 mg total) by mouth daily. 90 tablet 0  . metroNIDAZOLE (FLAGYL) 500 MG tablet Take 1 tablet (500 mg total) by mouth 2 (two) times daily for 7 days. 14 tablet 0  . olmesartan (BENICAR) 40 MG tablet Take 40 mg by mouth daily.    Marland Kitchen omeprazole (PRILOSEC) 20 MG capsule Take 20 mg by mouth daily.    . ondansetron (ZOFRAN ODT) 4 MG disintegrating tablet Take 1 tablet (4 mg total) by mouth every 8 (eight) hours as needed for nausea or vomiting. 20 tablet 0  . tranexamic acid (LYSTEDA)  650 MG TABS tablet Take 2 tablets (1,300 mg total) by mouth 3 (three) times daily. Take during menses for a maximum of five days 30 tablet 2   No current facility-administered medications for this visit.   No results found.  Review of Systems:   A ROS was performed including pertinent positives and negatives as documented in the HPI.  Physical Exam :   Constitutional: NAD and appears stated age Neurological: Alert and oriented Psych: Appropriate affect and cooperative There were no vitals taken for this visit.   Comprehensive Musculoskeletal Exam:    Musculoskeletal Exam    Inspection Right Left  Skin No atrophy or winging No atrophy or winging  Palpation    Tenderness None Glenohumeral  Range of Motion    Flexion (passive) 170 130  Flexion (active) 170 130  Abduction 170 110  ER at the side 70 35  Can reach behind back to T12 Back pocket  Strength     Full Full with pain  Special Tests    Pseudoparalytic No No  Neurologic    Fires PIN, radial, median, ulnar, musculocutaneous, axillary, suprascapular, long thoracic, and spinal accessory innervated muscles. No abnormal sensibility  Vascular/Lymphatic    Radial Pulse 2+ 2+  Cervical Exam    Patient has symmetric cervical range of motion with negative Spurling's test.  Special Test: Positive pain with external rotation at the side     Imaging:   Xray (3 views left shoulder): Normal I personally reviewed and interpreted the radiographs.   Assessment:   50 year old female with left shoulder pain consistent with adhesive capsulitis.  I recommended that we perform ultrasound-guided injection of the left glenohumeral joint today.  I will see her back in 2 weeks for reassessment we will consider additional injection at that time  Plan :    -Left ultrasound-guided shoulder injection provided today    Procedure Note  Patient: Crystal Vasquez             Date of Birth: 11/21/1970           MRN: [id number]              Visit Date: 02/22/2021  Procedures: Visit Diagnoses: No diagnosis found.  Large Joint Inj: L glenohumeral on 02/22/2021 5:24 PM Indications: pain Details: 22 G 1.5 in needle, ultrasound-guided anterior approach  Arthrogram: No  Medications: 4 mL lidocaine 1 %; 80 mg  triamcinolone acetonide 40 MG/ML Outcome: tolerated well, no immediate complications Procedure, treatment alternatives, risks and benefits explained, specific risks discussed. Consent was given by the patient. Immediately prior to procedure a time out was called to verify the correct patient, procedure, equipment, support staff and site/side marked as required. Patient was prepped and draped in the usual sterile fashion.        I personally saw and evaluated the patient, and participated in the management and treatment plan.  Vanetta Mulders, MD Attending Physician, Orthopedic Surgery  This document was dictated using Dragon voice recognition software. A reasonable attempt at proof reading has been made to minimize errors.

## 2021-02-25 ENCOUNTER — Other Ambulatory Visit: Payer: Self-pay | Admitting: Physician Assistant

## 2021-02-25 ENCOUNTER — Other Ambulatory Visit: Payer: Self-pay | Admitting: Family Medicine

## 2021-02-25 DIAGNOSIS — B9689 Other specified bacterial agents as the cause of diseases classified elsewhere: Secondary | ICD-10-CM

## 2021-02-25 DIAGNOSIS — I10 Essential (primary) hypertension: Secondary | ICD-10-CM

## 2021-02-25 DIAGNOSIS — E1169 Type 2 diabetes mellitus with other specified complication: Secondary | ICD-10-CM

## 2021-02-25 DIAGNOSIS — N760 Acute vaginitis: Secondary | ICD-10-CM

## 2021-02-28 ENCOUNTER — Telehealth: Payer: Self-pay | Admitting: Family Medicine

## 2021-02-28 NOTE — Telephone Encounter (Signed)
I spoke with patient and she is going to reach out to [facility] again to see if medication is up there. If not she will contact office.

## 2021-02-28 NOTE — Telephone Encounter (Signed)
Pt walked in clinic wanting an answer for reason for denial of medications since she just had an office visit for this 11 days ago, please advise and thank you   The following medication renewals have been denied:  - lisinopril (ZESTRIL) 40 MG tablet  - hydrochlorothiazide (HYDRODIURIL) 25 MG tablet  - amLODipine (NORVASC) 10 MG tablet  - metoprolol succinate (TOPROL-XL) 25 MG 24 hr tablet  - glipiZIDE (GLUCOTROL) 10 MG tablet  - metFORMIN (GLUCOPHAGE) 1000 MG tablet   If you have any questions about your prescription, please send a message to your doctor.   [place], [place].  [address] [place] [address]

## 2021-03-01 ENCOUNTER — Encounter: Payer: Managed Care, Other (non HMO) | Admitting: Family

## 2021-03-04 ENCOUNTER — Ambulatory Visit: Payer: Self-pay | Admitting: Clinical

## 2021-03-04 ENCOUNTER — Ambulatory Visit (HOSPITAL_BASED_OUTPATIENT_CLINIC_OR_DEPARTMENT_OTHER): Payer: Managed Care, Other (non HMO) | Admitting: Orthopaedic Surgery

## 2021-03-04 DIAGNOSIS — Z658 Other specified problems related to psychosocial circumstances: Secondary | ICD-10-CM

## 2021-03-04 DIAGNOSIS — F419 Anxiety disorder, unspecified: Secondary | ICD-10-CM

## 2021-03-04 NOTE — Patient Instructions (Signed)
Center for [facility] [place], [place] [address] [phone] (main office) [phone] office)  [facility] [address] [place], [place] [address] [phone] or [place] 24/7    [facility], [place], [place], [place] Fax: [phone] [email or web address] *Interpreters available *Accepts all insurance and uninsured for Urgent Care needs *Accepts Medicaid and uninsured for outpatient treatment     /Emotional Wellbeing Apps and Websites Here are a few free apps meant to help you to help yourself.  To find, try searching on the internet to see if the app is offered on Apple/Android devices. If your first choice doesn't come up on your device, the good news is that there are many choices! Play around with different apps to see which ones are helpful to you.    Calm This is an app meant to help increase calm feelings. Includes info, strategies, and tools for tracking your feelings.      Calm Harm  This app is meant to help with self-harm. Provides many 5-minute or 15-min coping strategies for doing instead of hurting yourself.       [place] is a problem-solving tool to help deal with emotions and cope with stress you encounter wherever you are.      MindShift This app can help people cope with anxiety. Rather than trying to avoid anxiety, you can make an important shift and face it.      MY3  MY3 features a support system, safety plan and resources with the goal of offering a tool to use in a time of need.       My Life My Voice  This mood journal offers a simple solution for tracking your thoughts, feelings and moods. Animated emoticons can help identify your mood.       Relax Melodies Designed to help with sleep, on this app you can mix sounds and  meditations for relaxation.      Smiling Mind Smiling Mind is meditation made easy: it's a simple tool that helps put a smile on your mind.        Stop, Breathe & Think  A friendly, simple guide for people through meditations for mindfulness and compassion.  Stop, Breathe and Think Kids Enter your current feelings and choose a mission to help you cope. Offers videos for certain moods instead of just sound recordings.       Team Orange The goal of this tool is to help teens change how they think, act, and react. This app helps you focus on your own good feelings and experiences.      The [place] Box The [place] Box (VHB) contains simple tools to help patients with coping, relaxation, distraction, and positive thinking.

## 2021-03-07 ENCOUNTER — Ambulatory Visit (HOSPITAL_BASED_OUTPATIENT_CLINIC_OR_DEPARTMENT_OTHER): Payer: Managed Care, Other (non HMO) | Admitting: Orthopaedic Surgery

## 2021-03-07 ENCOUNTER — Ambulatory Visit: Payer: Managed Care, Other (non HMO) | Admitting: Family Medicine

## 2021-03-15 ENCOUNTER — Other Ambulatory Visit: Payer: Self-pay

## 2021-03-15 ENCOUNTER — Ambulatory Visit (INDEPENDENT_AMBULATORY_CARE_PROVIDER_SITE_OTHER): Payer: Managed Care, Other (non HMO) | Admitting: Orthopaedic Surgery

## 2021-03-15 DIAGNOSIS — M25512 Pain in left shoulder: Secondary | ICD-10-CM | POA: Diagnosis not present

## 2021-03-15 DIAGNOSIS — G8929 Other chronic pain: Secondary | ICD-10-CM

## 2021-03-15 MED ORDER — TRIAMCINOLONE ACETONIDE 40 MG/ML IJ SUSP
80.0000 mg | INTRAMUSCULAR | Status: AC | PRN
  Administered 2021-03-15: 17:00:00 80 mg via INTRA_ARTICULAR

## 2021-03-15 MED ORDER — LIDOCAINE HCL 1 % IJ SOLN
4.0000 mL | INTRAMUSCULAR | Status: AC | PRN
  Administered 2021-03-15: 17:00:00 4 mL

## 2021-03-15 NOTE — BH Specialist Note (Signed)
Integrated Behavioral Health via Telemedicine Visit  03/15/2021 Crystal Vasquez [id number]  Number of [place] visits: 2 Session Start time: 8:15  Session End time: 8:52 Total time:  49  Referring Provider: Aletha Halim, MD Patient/Family location: Home [facility] Provider location: Center for [place] [facility]  All persons participating in visit: Patient Crystal Vasquez and [place]   Types of Service: Individual psychotherapy and Video visit  I connected with Crystal Vasquez and/or Crystal Vasquez's  n/a  via  Telephone or Geologist, engineering  (Video is Caregility application) and verified that I am speaking with the correct person using two identifiers. Discussed confidentiality: Yes   I discussed the limitations of telemedicine and the availability of in person appointments.  Discussed there is a possibility of technology failure and discussed alternative modes of communication if that failure occurs.  I discussed that engaging in this telemedicine visit, they consent to the provision of behavioral healthcare and the services will be billed under their insurance.  Patient and/or legal guardian expressed understanding and consented to Telemedicine visit: Yes   Presenting Concerns: Patient and/or family reports the following symptoms/concerns: Processing feelings regarding current physical health and history of unhealthy relationships; moving towards personal life goals for next 3 months:  Obtain surgery  Find work after recovery from surgery Obtain bigger, safer vehicle   Duration of problem: Increase in symptoms over time; with recent clarity towards life goals; Severity of problem: moderate  Patient and/or Family's Strengths/Protective Factors: Sense of purpose  Goals Addressed: Patient will:  Reduce symptoms of: anxiety, depression, and stress   Demonstrate ability to:  Increase healthy adjustment to current life circumstances and Increase motivation to adhere to plan of care  Progress towards Goals: Ongoing  Interventions: Interventions utilized:  Solution-Focused Strategies Standardized Assessments completed: Not Needed  Patient and/or Family Response: Pt agrees with treatment plan  Assessment: Patient currently experiencing Anxiety disorder and Psychosocial stress.   Patient may benefit from continued psychoeducation and brief therapeutic interventions regarding coping with symptoms of anxiety, depression, life stress .  Plan: Follow up with behavioral health clinician on : Two weeks Behavioral recommendations:  -Continue with plan to attend all upcoming medical appointments; follow-through with scheduling surgery, and allowing appropriate recovery time -Continue researching vehicles that would best fit future needs, along with researching any supplies needed for vehicle to align with life goals -Continue with plan to research potential future employers prior to surgery; during recovery time, begin applying for positions that meet life goals -Continue setting appropriate boundaries in all current relationships Referral(s): [place] (In Clinic)  I discussed the assessment and treatment plan with the patient and/or parent/guardian. They were provided an opportunity to ask questions and all were answered. They agreed with the plan and demonstrated an understanding of the instructions.   They were advised to call back or seek an in-person evaluation if the symptoms worsen or if the condition fails to improve as anticipated.  Crystal Hamman Andrewjames Weirauch, LCSW

## 2021-03-15 NOTE — Progress Notes (Signed)
Chief Complaint: Left shoulder pain     History of Present Illness:   03/15/2021: Presents today for follow-up of the left shoulder.  Overall is doing remarkably better after the injection.  She is now able to sleep much better.  Crystal Vasquez is a 50 y.o. female right hand dominant female with left shoulder pain going on now for 4 to 5 months.  She did not have any specific injury or accident.  She states that she saw her primary care doctor who recommended surgery ultimately for frozen shoulder.  She continues to have pain and throbbing about the shoulder.  She is unable to lay directly on the side.  She currently works as a Producer, television/film/video at [employer]  This resulted in heavy lifting that has been significantly difficult for her.  She does have a history of thyroid disease as well as diabetes.  She has not had any injections prior    Surgical History:   None  PMH/PSH/Family History/Social History/Meds/Allergies:    Past Medical History:  Diagnosis Date   DM2    Hypertension    Obesity (BMI 30-39.9)    Thyroid disease    Past Surgical History:  Procedure Laterality Date   BIOPSY THYROID     Social History   Socioeconomic History   Marital status: Single    Spouse name: Not on file   Number of children: Not on file   Years of education: Not on file   Highest education level: Not on file  Occupational History   Not on file  Tobacco Use   Smoking status: Never   Smokeless tobacco: Never  Vaping Use   Vaping Use: Never used  Substance and Sexual Activity   Alcohol use: Not Currently   Drug use: Never   Sexual activity: Not Currently  Other Topics Concern   Not on file  Social History Narrative   Not on file   Social Determinants of Health   Financial Resource Strain: Not on file  Food Insecurity: Food Insecurity Present   Worried About [place] in the Last Year: Often true   Ran Out of Food in  the Last Year: Sometimes true  Transportation Needs: Unmet Transportation Needs   Lack of Transportation (Medical): Yes   Lack of Transportation (Non-Medical): Yes  Physical Activity: Not on file  Stress: Not on file  Social Connections: Not on file   Family History  Problem Relation Age of Onset   Endometrial cancer Maternal Aunt    Colon cancer Neg Hx    Breast cancer Neg Hx    Ovarian cancer Neg Hx    Pancreatic cancer Neg Hx    Prostate cancer Neg Hx    No Known Allergies Current Outpatient Medications  Medication Sig Dispense Refill   acetaminophen (TYLENOL) 325 MG tablet Take 650 mg by mouth every 6 (six) hours as needed for moderate pain.     amLODipine (NORVASC) 10 MG tablet Take 1 tablet (10 mg total) by mouth daily. 90 tablet 0   ferrous sulfate 325 (65 FE) MG tablet Take 325 mg by mouth 2 (two) times daily with a meal.     fluticasone (VERAMYST) 27.5 MCG/SPRAY nasal spray Place 1 spray into the nose daily.     glipiZIDE (GLUCOTROL) 10 MG tablet  Take 1 tablet (10 mg total) by mouth 2 (two) times daily before a meal. 180 tablet 0   hydrochlorothiazide (HYDRODIURIL) 25 MG tablet Take 1 tablet (25 mg total) by mouth daily. 90 tablet 0   lisinopril (ZESTRIL) 40 MG tablet Take 1 tablet (40 mg total) by mouth daily. 90 tablet 0   medroxyPROGESTERone (PROVERA) 10 MG tablet Take 1 tablet (10 mg total) by mouth daily. 30 tablet 2   metFORMIN (GLUCOPHAGE) 1000 MG tablet Take 1 tablet (1,000 mg total) by mouth 2 (two) times daily with a meal. 180 tablet 0   metoprolol succinate (TOPROL-XL) 25 MG 24 hr tablet Take 1 tablet (25 mg total) by mouth daily. 90 tablet 0   olmesartan (BENICAR) 40 MG tablet Take 40 mg by mouth daily.     omeprazole (PRILOSEC) 20 MG capsule Take 20 mg by mouth daily.     ondansetron (ZOFRAN ODT) 4 MG disintegrating tablet Take 1 tablet (4 mg total) by mouth every 8 (eight) hours as needed for nausea or vomiting. 20 tablet 0   tranexamic acid (LYSTEDA) 650 MG  TABS tablet Take 2 tablets (1,300 mg total) by mouth 3 (three) times daily. Take during menses for a maximum of five days 30 tablet 2   No current facility-administered medications for this visit.   No results found.  Review of Systems:   A ROS was performed including pertinent positives and negatives as documented in the HPI.  Physical Exam :   Constitutional: NAD and appears stated age Neurological: Alert and oriented Psych: Appropriate affect and cooperative There were no vitals taken for this visit.   Comprehensive Musculoskeletal Exam:    Musculoskeletal Exam    Inspection Right Left  Skin No atrophy or winging No atrophy or winging  Palpation    Tenderness None Glenohumeral  Range of Motion    Flexion (passive) 170 160  Flexion (active) 170 160  Abduction 170 150  ER at the side 70 50  Can reach behind back to T12 L1  Strength     Full Full with pain  Special Tests    Pseudoparalytic No No  Neurologic    Fires PIN, radial, median, ulnar, musculocutaneous, axillary, suprascapular, long thoracic, and spinal accessory innervated muscles. No abnormal sensibility  Vascular/Lymphatic    Radial Pulse 2+ 2+  Cervical Exam    Patient has symmetric cervical range of motion with negative Spurling's test.  Special Test: Positive pain with external rotation at the side     Imaging:   Xray (3 views left shoulder): Normal I personally reviewed and interpreted the radiographs.   Assessment:   50 year old female with left shoulder pain consistent with adhesive capsulitis.  I recommended an additional shoulder injection at today's visit so that we can achieve terminal range of motion.  I would also like her to begin physical therapy at this time  Plan :    -Left ultrasound-guided shoulder injection provided today    Procedure Note  Patient: Crystal Vasquez             Date of Birth: Mar 11, 1971           MRN: [id number]             Visit Date:  03/15/2021  Procedures: Visit Diagnoses:  1. Left shoulder pain, unspecified chronicity     Large Joint Inj on 03/15/2021 4:58 PM Indications: pain Details: 22 G 1.5 in needle, ultrasound-guided anterior approach  Arthrogram: No  Medications: 4 mL  lidocaine 1 %; 80 mg triamcinolone acetonide 40 MG/ML Outcome: tolerated well, no immediate complications Procedure, treatment alternatives, risks and benefits explained, specific risks discussed. Consent was given by the patient. Immediately prior to procedure a time out was called to verify the correct patient, procedure, equipment, support staff and site/side marked as required. Patient was prepped and draped in the usual sterile fashion.        I personally saw and evaluated the patient, and participated in the management and treatment plan.  Vanetta Mulders, MD Attending Physician, Orthopedic Surgery  This document was dictated using Dragon voice recognition software. A reasonable attempt at proof reading has been made to minimize errors.

## 2021-03-22 NOTE — Progress Notes (Signed)
Erroneous encounter

## 2021-03-23 ENCOUNTER — Encounter: Payer: Self-pay | Admitting: Gynecologic Oncology

## 2021-03-23 ENCOUNTER — Telehealth: Payer: Self-pay | Admitting: *Deleted

## 2021-03-23 NOTE — Telephone Encounter (Signed)
Patient called and left a message that she is ready to schedule her [place] procedure and her surgery

## 2021-03-24 ENCOUNTER — Other Ambulatory Visit: Payer: Self-pay | Admitting: Gynecologic Oncology

## 2021-03-24 DIAGNOSIS — N939 Abnormal uterine and vaginal bleeding, unspecified: Secondary | ICD-10-CM

## 2021-03-24 NOTE — Telephone Encounter (Signed)
Called and scheduled the patient for a follow appt with labs for tomorrow

## 2021-03-24 NOTE — Progress Notes (Signed)
Patient has had uterine bleeding for the past 22 days. CBC to evaluate for anemia.

## 2021-03-25 ENCOUNTER — Ambulatory Visit (INDEPENDENT_AMBULATORY_CARE_PROVIDER_SITE_OTHER): Payer: Managed Care, Other (non HMO) | Admitting: Clinical

## 2021-03-25 ENCOUNTER — Inpatient Hospital Stay: Payer: Managed Care, Other (non HMO)

## 2021-03-25 ENCOUNTER — Other Ambulatory Visit: Payer: Self-pay

## 2021-03-25 ENCOUNTER — Inpatient Hospital Stay: Payer: Managed Care, Other (non HMO) | Attending: Gynecologic Oncology | Admitting: Gynecologic Oncology

## 2021-03-25 ENCOUNTER — Encounter: Payer: Self-pay | Admitting: Gynecologic Oncology

## 2021-03-25 VITALS — BP 146/81 | HR 80 | Temp 97.7°F | Resp 16 | Ht 65.0 in | Wt 171.0 lb

## 2021-03-25 DIAGNOSIS — R002 Palpitations: Secondary | ICD-10-CM | POA: Diagnosis not present

## 2021-03-25 DIAGNOSIS — R109 Unspecified abdominal pain: Secondary | ICD-10-CM | POA: Diagnosis not present

## 2021-03-25 DIAGNOSIS — I10 Essential (primary) hypertension: Secondary | ICD-10-CM | POA: Diagnosis not present

## 2021-03-25 DIAGNOSIS — D259 Leiomyoma of uterus, unspecified: Secondary | ICD-10-CM | POA: Diagnosis present

## 2021-03-25 DIAGNOSIS — R102 Pelvic and perineal pain: Secondary | ICD-10-CM | POA: Diagnosis not present

## 2021-03-25 DIAGNOSIS — M549 Dorsalgia, unspecified: Secondary | ICD-10-CM | POA: Diagnosis not present

## 2021-03-25 DIAGNOSIS — R42 Dizziness and giddiness: Secondary | ICD-10-CM | POA: Insufficient documentation

## 2021-03-25 DIAGNOSIS — N939 Abnormal uterine and vaginal bleeding, unspecified: Secondary | ICD-10-CM

## 2021-03-25 DIAGNOSIS — D500 Iron deficiency anemia secondary to blood loss (chronic): Secondary | ICD-10-CM | POA: Diagnosis not present

## 2021-03-25 DIAGNOSIS — E079 Disorder of thyroid, unspecified: Secondary | ICD-10-CM | POA: Diagnosis not present

## 2021-03-25 DIAGNOSIS — R110 Nausea: Secondary | ICD-10-CM | POA: Diagnosis not present

## 2021-03-25 DIAGNOSIS — Z79899 Other long term (current) drug therapy: Secondary | ICD-10-CM | POA: Insufficient documentation

## 2021-03-25 DIAGNOSIS — R5383 Other fatigue: Secondary | ICD-10-CM | POA: Diagnosis not present

## 2021-03-25 DIAGNOSIS — N920 Excessive and frequent menstruation with regular cycle: Secondary | ICD-10-CM | POA: Diagnosis present

## 2021-03-25 DIAGNOSIS — E669 Obesity, unspecified: Secondary | ICD-10-CM | POA: Diagnosis not present

## 2021-03-25 DIAGNOSIS — Z6830 Body mass index (BMI) 30.0-30.9, adult: Secondary | ICD-10-CM | POA: Diagnosis not present

## 2021-03-25 DIAGNOSIS — Z658 Other specified problems related to psychosocial circumstances: Secondary | ICD-10-CM

## 2021-03-25 DIAGNOSIS — E119 Type 2 diabetes mellitus without complications: Secondary | ICD-10-CM | POA: Insufficient documentation

## 2021-03-25 DIAGNOSIS — F419 Anxiety disorder, unspecified: Secondary | ICD-10-CM | POA: Diagnosis not present

## 2021-03-25 DIAGNOSIS — Z7984 Long term (current) use of oral hypoglycemic drugs: Secondary | ICD-10-CM | POA: Insufficient documentation

## 2021-03-25 DIAGNOSIS — R0602 Shortness of breath: Secondary | ICD-10-CM | POA: Insufficient documentation

## 2021-03-25 LAB — CBC WITH DIFFERENTIAL (CANCER CENTER ONLY)
Abs Immature Granulocytes: 0.01 10*3/uL (ref 0.00–0.07)
Basophils Absolute: 0 10*3/uL (ref 0.0–0.1)
Basophils Relative: 0 %
Eosinophils Absolute: 0.1 10*3/uL (ref 0.0–0.5)
Eosinophils Relative: 1 %
HCT: 32 % — ABNORMAL LOW (ref 36.0–46.0)
Hemoglobin: 9.7 g/dL — ABNORMAL LOW (ref 12.0–15.0)
Immature Granulocytes: 0 %
Lymphocytes Relative: 26 %
Lymphs Abs: 2.5 10*3/uL (ref 0.7–4.0)
MCH: 26.1 pg (ref 26.0–34.0)
MCHC: 30.3 g/dL (ref 30.0–36.0)
MCV: 86.3 fL (ref 80.0–100.0)
Monocytes Absolute: 0.6 10*3/uL (ref 0.1–1.0)
Monocytes Relative: 6 %
Neutro Abs: 6.2 10*3/uL (ref 1.7–7.7)
Neutrophils Relative %: 67 %
Platelet Count: 430 10*3/uL — ABNORMAL HIGH (ref 150–400)
RBC: 3.71 MIL/uL — ABNORMAL LOW (ref 3.87–5.11)
RDW: 14.2 % (ref 11.5–15.5)
WBC Count: 9.4 10*3/uL (ref 4.0–10.5)
nRBC: 0 % (ref 0.0–0.2)

## 2021-03-25 NOTE — BH Specialist Note (Signed)
Integrated Behavioral Health via Telemedicine Visit  03/25/2021 Crystal Vasquez [id number]  Number of [place] visits: 3 Session Start time: 8:46  Session End time: 9:01 Total time: 15  Referring Provider: Aletha Halim, MD Patient/Family location: [facility] Provider location: [facility] [place] [facility]  All persons participating in visit: Patient Crystal Vasquez and [place]   Types of Service: Individual psychotherapy and Video visit  I connected with Crystal Vasquez and/or Crystal Vasquez's  n/a  via  Telephone or Geologist, engineering  (Video is Caregility application) and verified that I am speaking with the correct person using two identifiers. Discussed confidentiality: Yes   I discussed the limitations of telemedicine and the availability of in person appointments.  Discussed there is a possibility of technology failure and discussed alternative modes of communication if that failure occurs.  I discussed that engaging in this telemedicine visit, they consent to the provision of behavioral healthcare and the services will be billed under their insurance.  Patient and/or legal guardian expressed understanding and consented to Telemedicine visit: Yes   Presenting Concerns: Patient and/or family reports the following symptoms/concerns: Financial stress, and worry about whether or not she still has insurance coverage or not, as this will affect her ability to meet her first goal of obtaining necessary surgery.  Duration of problem: Increase over time; Severity of problem: moderate  Patient and/or Family's Strengths/Protective Factors: Social connections and Sense of purpose  Goals Addressed: Patient will:  Reduce symptoms of: anxiety, depression, and stress   Increase knowledge and/or ability of: stress reduction   Demonstrate ability to: Increase healthy adjustment to  current life circumstances  Progress towards Goals: Ongoing  Interventions: Interventions utilized:  Solution-Focused Strategies Standardized Assessments completed: Not Needed  Patient and/or Family Response: Pt agrees with ongoing treatment plan  Assessment: Patient currently experiencing Anxiety disorder, unspecified and Psychosocial stress  Patient may benefit from continued psychoeducation and brief therapeutic interventions regarding coping with symptoms of anxiety, depression, life stress .  Plan: Follow up with behavioral health clinician on : Call Crystal Vasquez at [phone], as needed. Behavioral recommendations:  -Continue with plan to contact [employer] today, to confirm insurance status; if now uninsured, fill out [employer] paperwork and bring to front desk of [facility] -Continue working towards first goal of scheduling and obtaining surgery -Continue with additional goals post-surgery (find work; obtain bigger, safer vehicle) Referral(s): Crystal Vasquez (In Clinic)  I discussed the assessment and treatment plan with the patient and/or parent/guardian. They were provided an opportunity to ask questions and all were answered. They agreed with the plan and demonstrated an understanding of the instructions.   They were advised to call back or seek an in-person evaluation if the symptoms worsen or if the condition fails to improve as anticipated.  Crystal Vasquez Crystal Rettinger, LCSW

## 2021-03-25 NOTE — Progress Notes (Signed)
Gynecologic Oncology Return Clinic Visit  03/25/21  Reason for Visit: Surgery planning  Treatment History: 12/03/2020: Referred to see me initially for prolapsing uterine versus intracervical mass. Biopsies of the mass showed acutely inflamed glandular mucosal tissue, no malignancy.  Findings may represent endometrial polyp prolapsing through the cervix. 12/19/2020: MRI of the pelvis confirms 10 x 4 cm heterogenous mass in the endometrial canal that extends to the cervical os.  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  Scattered uterine leiomyomata are also noted.  Interval History: Presents today for follow-up and repeat discussion about surgery.  Since October, she has had multiple episodes of heavy bleeding.  Most recently, she has been bleeding since December 13 which she describes as mild to moderate.  She thought the bleeding was stopping but then she began having increased bleeding and some passage of clots on Saturday.  The last 3-4 days, she reports intermittent bilateral pelvic pain, mid abdominal pain, and back pain.  She describes this as cramping/pinching which comes and goes.  She has occasionally felt short of breath with increased activity.  She also notes feeling a little woozy intermittently.  She is not currently working given her living situation as well as her medical issues.  She is now on diabetes medication and hemoglobin A1c recently had improved significantly.  She notes that on average her CBGs are about 200.  She is seeing a different primary care provider next week.  Her living situation is somewhat tenuous.  She is currently living with friends who have offered to help her pay bills so that she can move forward with getting surgery scheduled.  Past Medical/Surgical History: Past Medical History:  Diagnosis Date   DM2    Hypertension    Obesity (BMI 30-39.9)    Thyroid disease     Past Surgical History:  Procedure Laterality Date   BIOPSY  THYROID      Family History  Problem Relation Age of Onset   Endometrial cancer Maternal Aunt    Colon cancer Neg Hx    Breast cancer Neg Hx    Ovarian cancer Neg Hx    Pancreatic cancer Neg Hx    Prostate cancer Neg Hx     Social History   Socioeconomic History   Marital status: Single    Spouse name: Not on file   Number of children: Not on file   Years of education: Not on file   Highest education level: Not on file  Occupational History   Not on file  Tobacco Use   Smoking status: Never   Smokeless tobacco: Never  Vaping Use   Vaping Use: Never used  Substance and Sexual Activity   Alcohol use: Not Currently   Drug use: Never   Sexual activity: Not Currently  Other Topics Concern   Not on file  Social History Narrative   Not on file   Social Determinants of Health   Financial Resource Strain: Not on file  Food Insecurity: Food Insecurity Present   Worried About [place] in the Last Year: Often true   Ran Out of Food in the Last Year: Sometimes true  Transportation Needs: Unmet Transportation Needs   Lack of Transportation (Medical): Yes   Lack of Transportation (Non-Medical): Yes  Physical Activity: Not on file  Stress: Not on file  Social Connections: Not on file    Current Medications:  Current Outpatient Medications:    acetaminophen (TYLENOL) 325 MG tablet, Take 650 mg by mouth  every 6 (six) hours as needed for moderate pain., Disp: , Rfl:    amLODipine (NORVASC) 10 MG tablet, Take 1 tablet (10 mg total) by mouth daily., Disp: 90 tablet, Rfl: 0   ferrous sulfate 325 (65 FE) MG tablet, Take 325 mg by mouth 2 (two) times daily with a meal., Disp: , Rfl:    fluticasone (VERAMYST) 27.5 MCG/SPRAY nasal spray, Place 1 spray into the nose daily., Disp: , Rfl:    glipiZIDE (GLUCOTROL) 10 MG tablet, Take 1 tablet (10 mg total) by mouth 2 (two) times daily before a meal., Disp: 180 tablet, Rfl: 0   hydrochlorothiazide (HYDRODIURIL) 25 MG tablet,  Take 1 tablet (25 mg total) by mouth daily., Disp: 90 tablet, Rfl: 0   lisinopril (ZESTRIL) 40 MG tablet, Take 1 tablet (40 mg total) by mouth daily., Disp: 90 tablet, Rfl: 0   medroxyPROGESTERone (PROVERA) 10 MG tablet, Take 1 tablet (10 mg total) by mouth daily., Disp: 30 tablet, Rfl: 2   metFORMIN (GLUCOPHAGE) 1000 MG tablet, Take 1 tablet (1,000 mg total) by mouth 2 (two) times daily with a meal., Disp: 180 tablet, Rfl: 0   metoprolol succinate (TOPROL-XL) 25 MG 24 hr tablet, Take 1 tablet (25 mg total) by mouth daily., Disp: 90 tablet, Rfl: 0   olmesartan (BENICAR) 40 MG tablet, Take 40 mg by mouth daily., Disp: , Rfl:    omeprazole (PRILOSEC) 20 MG capsule, Take 20 mg by mouth daily., Disp: , Rfl:    ondansetron (ZOFRAN ODT) 4 MG disintegrating tablet, Take 1 tablet (4 mg total) by mouth every 8 (eight) hours as needed for nausea or vomiting., Disp: 20 tablet, Rfl: 0   tranexamic acid (LYSTEDA) 650 MG TABS tablet, Take 2 tablets (1,300 mg total) by mouth 3 (three) times daily. Take during menses for a maximum of five days, Disp: 30 tablet, Rfl: 2  Review of Systems: Pertinent positives include fatigue, shortness of breath, 1 episode of palpitations, abdominal distention, pelvic pain, vaginal bleeding, back pain, dizziness, anxiety. Denies appetite changes, fevers, chills, unexplained weight changes. Denies hearing loss, neck lumps or masses, mouth sores, ringing in ears or voice changes. Denies cough or wheezing.   Denies chest pain. Denies leg swelling. Denies blood in stools, constipation, diarrhea, nausea, vomiting, or early satiety. Denies pain with intercourse, dysuria, frequency, hematuria or incontinence. Denies hot flashes or vaginal discharge.   Denies joint pain or muscle pain/cramps. Denies itching, rash, or wounds. Denies headaches, numbness or seizures. Denies swollen lymph nodes or glands, denies easy bruising or bleeding. Denies depression, confusion, or decreased  concentration.  Physical Exam: BP (!) 146/81 (BP Location: Right Arm, Patient Position: Sitting)    Pulse 80    Temp 97.7 F (36.5 C) (Tympanic)    Resp 16    Ht 5\' 5"  (1.651 m)    Wt 171 lb (77.6 kg)    SpO2 100%    BMI 28.46 kg/m  General: Alert, oriented, no acute distress. HEENT: Normocephalic, atraumatic, sclera anicteric. Chest: Clear to auscultation bilaterally.  No wheezes or rhonchi. Cardiovascular: Regular rate and rhythm, no murmurs. Abdomen: soft, nontender.  Normoactive bowel sounds.  No masses or hepatosplenomegaly appreciated.   Extremities: Grossly normal range of motion.  Warm, well perfused.  No edema bilaterally. Skin: No rashes or lesions noted. Lymphatics: No cervical, supraclavicular, or inguinal adenopathy. GU: Normal appearing external genitalia without erythema, excoriation, or lesions.  Speculum exam reveals normal-appearing anterior cervix, blood within the vaginal vault.  Bimanual exam reveals cervix  dilated 1-2 cm with palpable prolapsing fibroid up to the external os but not through it.  Entire cervix complex is 6-8 cm.    Laboratory & Radiologic Studies: POCT Hgb A1C on 02/18/21: 6.7%  CBC    Component Value Date/Time   WBC 9.4 03/25/2021 1446   WBC 12.0 (H) 10/27/2020 1340   RBC 3.71 (L) 03/25/2021 1446   HGB 9.7 (L) 03/25/2021 1446   HGB 9.3 (L) 11/11/2020 1545   HCT 32.0 (L) 03/25/2021 1446   HCT 28.5 (L) 11/11/2020 1545   PLT 430 (H) 03/25/2021 1446   PLT 430 11/11/2020 1545   MCV 86.3 03/25/2021 1446   MCV 87 11/11/2020 1545   MCH 26.1 03/25/2021 1446   MCHC 30.3 03/25/2021 1446   RDW 14.2 03/25/2021 1446   RDW 19.1 (H) 11/11/2020 1545   LYMPHSABS 2.5 03/25/2021 1446   LYMPHSABS 1.9 11/11/2020 1545   MONOABS 0.6 03/25/2021 1446   EOSABS 0.1 03/25/2021 1446   EOSABS 0.1 11/11/2020 1545   BASOSABS 0.0 03/25/2021 1446   BASOSABS 0.0 11/11/2020 1545    Assessment & Plan: Crystal Vasquez is a 51 y.o. woman with symptomatic prolapsing  fibroid.  Patient would like to move forward with scheduling surgery.  She and I have previously discussed in detail what this would involve.  Given the size of her prolapsing fibroid, this is challenging given the dilated size of her cervix.  I think she would benefit from preoperative uterine artery embolization to decrease the size of the fibroid and decrease morbidity related to the surgery.  We have discussed the risk that this causes necrosis of the fibroid which then can either become infected or prolapse through the cervix and into the vagina.  We would tentatively plan to schedule definitive surgery approximately 2 weeks after her embolization.  For step involves referring her to interventional radiology.  This referral was placed today.  Given her persistent but stable blood loss anemia, I discussed with her referral to the presurgical anemia clinic for consideration of iron treatment.  She is amenable to this referral and one was placed today as well.  Patient is establishing care with a new primary care provider next week.  I have encouraged her to discuss her diabetic control as her sugars would ideally be less than 180 in the perioperative period to decrease morbidity as well as postoperative complications.  The patient was tentatively scheduled for a preoperative follow-up with Joylene John in several weeks although this may need to be rescheduled depending on timing of interventional radiology consult and coordination of surgery.  The patient was advised to call back or see an in-person evaluation if the symptoms worsen or if the condition fails to improve as anticipated.   32 minutes of total time was spent for this patient encounter, including preparation, face-to-face counseling with the patient and coordination of care, and documentation of the encounter.  Jeral Pinch, MD  Division of Gynecologic Oncology  Department of Obstetrics and Gynecology  [facility]

## 2021-03-25 NOTE — Patient Instructions (Signed)
It was good to see you today.  Please call my clinic next week for follow-up after you have your visit with your primary care provider.  Ideally, your sugars would be running under 180.  I am happy that overall they have improved though.  I am placing a referral today for you to see one of our interventional radiologist to talk about the uterine artery embolization procedure that you would benefit from having before surgery.  Once you have had that consultation, we will work to get that procedure scheduled and surgery with me about 2 weeks after that.  Given your persistent anemia secondary to blood loss, I am putting in a referral for you to be seen in our presurgical anemia clinic.  We will tentatively schedule a preoperative visit with Melissa in several weeks, but will change this if needed based on timing for surgery once we can coordinate with the interventional radiologist.

## 2021-03-28 ENCOUNTER — Telehealth: Payer: Self-pay | Admitting: Hematology and Oncology

## 2021-03-28 ENCOUNTER — Other Ambulatory Visit: Payer: Self-pay

## 2021-03-28 ENCOUNTER — Encounter: Payer: Managed Care, Other (non HMO) | Admitting: Family

## 2021-03-28 NOTE — Telephone Encounter (Signed)
Scheduled appt per 1/6 referral. Called pt, no answer. Left msg with appt date and time. Requested for pt to call back to confirm appt.

## 2021-03-29 ENCOUNTER — Encounter: Payer: Self-pay | Admitting: Hematology and Oncology

## 2021-03-29 ENCOUNTER — Inpatient Hospital Stay (HOSPITAL_BASED_OUTPATIENT_CLINIC_OR_DEPARTMENT_OTHER): Payer: Managed Care, Other (non HMO) | Admitting: Hematology and Oncology

## 2021-03-29 ENCOUNTER — Inpatient Hospital Stay: Payer: Managed Care, Other (non HMO)

## 2021-03-29 VITALS — BP 134/76 | HR 68 | Temp 98.4°F | Resp 18 | Wt 178.5 lb

## 2021-03-29 DIAGNOSIS — D500 Iron deficiency anemia secondary to blood loss (chronic): Secondary | ICD-10-CM | POA: Diagnosis not present

## 2021-03-29 DIAGNOSIS — N920 Excessive and frequent menstruation with regular cycle: Secondary | ICD-10-CM | POA: Diagnosis not present

## 2021-03-29 LAB — CMP (CANCER CENTER ONLY)
ALT: 13 U/L (ref 0–44)
AST: 11 U/L — ABNORMAL LOW (ref 15–41)
Albumin: 4.3 g/dL (ref 3.5–5.0)
Alkaline Phosphatase: 48 U/L (ref 38–126)
Anion gap: 10 (ref 5–15)
BUN: 20 mg/dL (ref 6–20)
CO2: 27 mmol/L (ref 22–32)
Calcium: 10 mg/dL (ref 8.9–10.3)
Chloride: 102 mmol/L (ref 98–111)
Creatinine: 0.72 mg/dL (ref 0.44–1.00)
GFR, Estimated: >60 mL/min (ref >60)
Glucose, Bld: 91 mg/dL (ref 70–99)
Potassium: 3.7 mmol/L (ref 3.5–5.1)
Sodium: 139 mmol/L (ref 135–145)
Total Bilirubin: 0.2 mg/dL — ABNORMAL LOW (ref 0.3–1.2)
Total Protein: 7.6 g/dL (ref 6.5–8.1)

## 2021-03-29 LAB — RETIC PANEL
Immature Retic Fract: 23.6 % — ABNORMAL HIGH (ref 2.3–15.9)
RBC.: 4.1 MIL/uL (ref 3.87–5.11)
Retic Count, Absolute: 83.6 10*3/uL (ref 19.0–186.0)
Retic Ct Pct: 2 % (ref 0.4–3.1)
Reticulocyte Hemoglobin: 29.9 pg (ref >27.9)

## 2021-03-29 LAB — CBC WITH DIFFERENTIAL (CANCER CENTER ONLY)
Abs Immature Granulocytes: 0.02 10*3/uL (ref 0.00–0.07)
Basophils Absolute: 0 10*3/uL (ref 0.0–0.1)
Basophils Relative: 0 %
Eosinophils Absolute: 0 10*3/uL (ref 0.0–0.5)
Eosinophils Relative: 0 %
HCT: 34.5 % — ABNORMAL LOW (ref 36.0–46.0)
Hemoglobin: 10.5 g/dL — ABNORMAL LOW (ref 12.0–15.0)
Immature Granulocytes: 0 %
Lymphocytes Relative: 24 %
Lymphs Abs: 1.9 10*3/uL (ref 0.7–4.0)
MCH: 26.4 pg (ref 26.0–34.0)
MCHC: 30.4 g/dL (ref 30.0–36.0)
MCV: 86.9 fL (ref 80.0–100.0)
Monocytes Absolute: 0.5 10*3/uL (ref 0.1–1.0)
Monocytes Relative: 6 %
Neutro Abs: 5.5 10*3/uL (ref 1.7–7.7)
Neutrophils Relative %: 70 %
Platelet Count: 438 10*3/uL — ABNORMAL HIGH (ref 150–400)
RBC: 3.97 MIL/uL (ref 3.87–5.11)
RDW: 14.6 % (ref 11.5–15.5)
WBC Count: 8 10*3/uL (ref 4.0–10.5)
nRBC: 0 % (ref 0.0–0.2)

## 2021-03-29 LAB — IRON AND IRON BINDING CAPACITY (CC-WL,HP ONLY)
Iron: 107 ug/dL (ref 28–170)
Saturation Ratios: 24 % (ref 10.4–31.8)
TIBC: 451 ug/dL — ABNORMAL HIGH (ref 250–450)
UIBC: 344 ug/dL (ref 148–442)

## 2021-03-29 LAB — FERRITIN: Ferritin: 5 ng/mL — ABNORMAL LOW (ref 11–307)

## 2021-03-29 NOTE — Progress Notes (Signed)
[place] [phone]   Fax:([phone] [place] NOTE  Patient Care Team: Dorna Mai, MD as PCP - General (Family Medicine)  Hematological/Oncological History # Iron Deficiency Anemia 2/2 to GYN Bleeding 10/20/2020: WBC 4.0, Hgb 12.0, MCV 86.4, Plt 250 03/25/2021: WBC 9.4, Hgb 9.7, MCV 86.3, Plt 430 03/29/2021: establish care with Dr. Lorenso Courier   CHIEF COMPLAINTS/PURPOSE OF CONSULTATION:  "Iron Deficiency Anemia, requesting evaluation PSH "  HISTORY OF PRESENTING ILLNESS:  Crystal Vasquez 51 y.o. female with medical history significant for type 2 diabetes, hypertension, obesity, thyroid disease, and heavy menstrual bleeding who presents for evaluation of iron deficiency anemia.  On review of the previous records Crystal Vasquez last had a normal hemoglobin on 10/20/2020 with hemoglobin 12.0 MCV of 86.4.  Most recently on 03/25/2021 the patient was found to have a hemoglobin of 9.7 with an MCV of 86.3 and platelets of 430.  She is also been having difficulties with extremely heavy menstrual bleeding and is currently under evaluation by GYN Onc for evaluation of a prolapsing fibroid.  Due to concern for her drop in hemoglobin and the need to increase her blood counts prior to surgical intervention the patient was referred to hematology for further evaluation and management.  On exam today Crystal Vasquez reports that she has been having difficulties with her blood for many years.  She reports has been going on for approximately 3 to 4 years in [place] prior to moving to [place].  There was some concern that she was having bleeding from her GI tract but only recently did come to retention that this was due to GYN bleeding.  She notes that she recently had a menstrual cycle the last of 26 days straight though the bleeding was not "severe" it was continuous.  She reports that she has been doing her best to eat iron rich foods including hamburger and red  meat to try to bolster her iron levels.  She has been on iron pills before in the past but has never received IV iron infusions or blood transfusions.  She is also trying to eat spinach and broccoli to bolster her iron levels.  She has been having some difficulty with nausea and unfortunately has not been eating as much lately.  On further discussion she notes that she is a never smoker and currently does not drink alcohol because she "cannot tolerate".  She is unable to hold down a steady job due to multiple missed days and therefore is unemployed.  She has no spouse or family and is currently living with a friend.  Her family history is remarkable for stroke in her father and ovarian cancer in her paternal aunt.  She does not have any children.  She does endorse having symptoms feeling like she is going to pass out and having shortness of breath.  She occasionally does have palpitations and dizziness.  She denies any ice cravings.  She reports no fevers, chills, sweats, nausea, vomiting or diarrhea.  A full 10 point ROS is listed below.  MEDICAL HISTORY:  Past Medical History:  Diagnosis Date   DM2    Hypertension    Obesity (BMI 30-39.9)    Thyroid disease     SURGICAL HISTORY: Past Surgical History:  Procedure Laterality Date   BIOPSY THYROID      SOCIAL HISTORY: Social History   Socioeconomic History   Marital status: Single    Spouse name: Not on file   Number of children:  Not on file   Years of education: Not on file   Highest education level: Not on file  Occupational History   Not on file  Tobacco Use   Smoking status: Never   Smokeless tobacco: Never  Vaping Use   Vaping Use: Never used  Substance and Sexual Activity   Alcohol use: Not Currently   Drug use: Never   Sexual activity: Not Currently  Other Topics Concern   Not on file  Social History Narrative   Not on file   Social Determinants of Health   Financial Resource Strain: Not on file  Food Insecurity:  Food Insecurity Present   Worried About [place] in the Last Year: Often true   Ran Out of Food in the Last Year: Sometimes true  Transportation Needs: Unmet Transportation Needs   Lack of Transportation (Medical): Yes   Lack of Transportation (Non-Medical): Yes  Physical Activity: Not on file  Stress: Not on file  Social Connections: Not on file  Intimate Partner Violence: Not on file    FAMILY HISTORY: Family History  Problem Relation Age of Onset   Endometrial cancer Maternal Aunt    Colon cancer Neg Hx    Breast cancer Neg Hx    Ovarian cancer Neg Hx    Pancreatic cancer Neg Hx    Prostate cancer Neg Hx     ALLERGIES:  has No Known Allergies.  MEDICATIONS:  Current Outpatient Medications  Medication Sig Dispense Refill   acetaminophen (TYLENOL) 325 MG tablet Take 650 mg by mouth every 6 (six) hours as needed for moderate pain.     amLODipine (NORVASC) 10 MG tablet Take 1 tablet (10 mg total) by mouth daily. 90 tablet 0   ferrous sulfate 325 (65 FE) MG tablet Take 325 mg by mouth 2 (two) times daily with a meal.     fluticasone (VERAMYST) 27.5 MCG/SPRAY nasal spray Place 1 spray into the nose daily.     glipiZIDE (GLUCOTROL) 10 MG tablet Take 1 tablet (10 mg total) by mouth 2 (two) times daily before a meal. 180 tablet 0   hydrochlorothiazide (HYDRODIURIL) 25 MG tablet Take 1 tablet (25 mg total) by mouth daily. 90 tablet 0   lisinopril (ZESTRIL) 40 MG tablet Take 1 tablet (40 mg total) by mouth daily. 90 tablet 0   medroxyPROGESTERone (PROVERA) 10 MG tablet Take 1 tablet (10 mg total) by mouth daily. 30 tablet 2   metFORMIN (GLUCOPHAGE) 1000 MG tablet Take 1 tablet (1,000 mg total) by mouth 2 (two) times daily with a meal. 180 tablet 0   metoprolol succinate (TOPROL-XL) 25 MG 24 hr tablet Take 1 tablet (25 mg total) by mouth daily. 90 tablet 0   olmesartan (BENICAR) 40 MG tablet Take 40 mg by mouth daily.     omeprazole (PRILOSEC) 20 MG capsule Take 20 mg by  mouth daily.     ondansetron (ZOFRAN ODT) 4 MG disintegrating tablet Take 1 tablet (4 mg total) by mouth every 8 (eight) hours as needed for nausea or vomiting. 20 tablet 0   tranexamic acid (LYSTEDA) 650 MG TABS tablet Take 2 tablets (1,300 mg total) by mouth 3 (three) times daily. Take during menses for a maximum of five days 30 tablet 2   No current facility-administered medications for this visit.    REVIEW OF SYSTEMS:   Constitutional: ( - ) fevers, ( - )  chills , ( - ) night sweats Eyes: ( - ) blurriness of vision, ( - )  double vision, ( - ) watery eyes Ears, nose, mouth, throat, and face: ( - ) mucositis, ( - ) sore throat Respiratory: ( - ) cough, ( - ) dyspnea, ( - ) wheezes Cardiovascular: ( - ) palpitation, ( - ) chest discomfort, ( - ) lower extremity swelling Gastrointestinal:  ( - ) nausea, ( - ) heartburn, ( - ) change in bowel habits Skin: ( - ) abnormal skin rashes Lymphatics: ( - ) new lymphadenopathy, ( - ) easy bruising Neurological: ( - ) numbness, ( - ) tingling, ( - ) new weaknesses Behavioral/Psych: ( - ) mood change, ( - ) new changes  All other systems were reviewed with the patient and are negative.  PHYSICAL EXAMINATION:  Vitals:   03/29/21 1106  BP: 134/76  Pulse: 68  Resp: 18  Temp: 98.4 F (36.9 C)  SpO2: 100%   Filed Weights   03/29/21 1106  Weight: 178 lb 8 oz (81 kg)    GENERAL: well appearing middle-aged Caucasian female in NAD  SKIN: skin color, texture, turgor are normal, no rashes or significant lesions EYES: conjunctiva are pink and non-injected, sclera clear LUNGS: clear to auscultation and percussion with normal breathing effort HEART: regular rate & rhythm and no murmurs and no lower extremity edema Musculoskeletal: no cyanosis of digits and no clubbing  PSYCH: alert & oriented x 3, fluent speech NEURO: no focal motor/sensory deficits  LABORATORY DATA:  I have reviewed the data as listed CBC Latest Ref Rng & Units 03/29/2021  03/25/2021 11/11/2020  WBC 4.0 - 10.5 K/uL 8.0 9.4 5.8  Hemoglobin 12.0 - 15.0 g/dL 10.5(L) 9.7(L) 9.3(L)  Hematocrit 36.0 - 46.0 % 34.5(L) 32.0(L) 28.5(L)  Platelets 150 - 400 K/uL 438(H) 430(H) 430    CMP Latest Ref Rng & Units 03/29/2021 10/27/2020 10/20/2020  Glucose 70 - 99 mg/dL 91 358(H) 251(H)  BUN 6 - 20 mg/dL 20 9 12   Creatinine 0.44 - 1.00 mg/dL 0.72 0.70 0.66  Sodium 135 - 145 mmol/L 139 136 137  Potassium 3.5 - 5.1 mmol/L 3.7 3.6 3.8  Chloride 98 - 111 mmol/L 102 99 105  CO2 22 - 32 mmol/L 27 25 25   Calcium 8.9 - 10.3 mg/dL 10.0 9.2 9.0  Total Protein 6.5 - 8.1 g/dL 7.6 7.7 6.9  Total Bilirubin 0.3 - 1.2 mg/dL 0.2(L) 0.3 0.6  Alkaline Phos 38 - 126 U/L 48 63 51  AST 15 - 41 U/L 11(L) 16 17  ALT 0 - 44 U/L 13 17 15      ASSESSMENT & PLAN Arpita A Knippenberg 51 y.o. female with medical history significant for type 2 diabetes, hypertension, obesity, thyroid disease, and heavy menstrual bleeding who presents for evaluation of iron deficiency anemia.  After review of the labs, review of the records, and discussion with the patient the patients findings are most consistent with iron deficiency anemia secondary to GYN bleeding.  The patient has a known prolapsing fibroid for which she is being followed by Dr. Berline Lopes.  Our goal at this time will be to increase the patient's hemoglobin levels to acceptable level prior to surgery.  The surgery has not yet been scheduled but they are intending for a procedure in early February.  # Iron Deficiency Anemia 2/2 to GYN Bleeding -- Findings are consistent with iron deficiency anemia secondary to patient's heavy menstrual bleeding due to prolapsing fibroid. -- Patient currently connected with GYN oncological surgery for consideration of resection of her prolapsing fibroid. --We will confirm iron  deficiency anemia by ordering iron panel and ferritin as well as reticulocytes, CBC, and CMP --Continue ferrous sulfate 325 mg daily with a source of  vitamin C --We will plan to proceed with IV iron therapy in order to help bolster the patient's blood counts --Plan for return to clinic in 4 to 6 weeks time after last dose of IV iron  Orders Placed This Encounter  Procedures   CBC with Differential ([facility] Only)    Standing Status:   Future    Number of Occurrences:   1    Standing Expiration Date:   03/29/2022   Ferritin    Standing Status:   Future    Number of Occurrences:   1    Standing Expiration Date:   03/29/2022   Iron and Iron Binding Capacity ([facility] only)    Standing Status:   Future    Number of Occurrences:   1    Standing Expiration Date:   03/29/2022   CMP ([facility] only)    Standing Status:   Future    Number of Occurrences:   1    Standing Expiration Date:   03/29/2022   Retic Panel    Standing Status:   Future    Number of Occurrences:   1    Standing Expiration Date:   03/29/2022    All questions were answered. The patient knows to call the clinic with any problems, questions or concerns.  A total of more than 60 minutes were spent on this encounter with face-to-face time and non-face-to-face time, including preparing to see the patient, ordering tests and/or medications, counseling the patient and coordination of care as outlined above.   Ledell Peoples, MD Department of Hematology/Oncology [place] [facility] Phone: [phone] Pager: [phone] Email: [email or web address]  03/29/2021 1:22 PM

## 2021-03-29 NOTE — Progress Notes (Signed)
Patient ID: Crystal Vasquez, female    DOB: 12/16/1970  MRN: [id number]  CC: Diabetes Follow-Up   Subjective: Crystal Vasquez is a 51 y.o. female who presents for diabetes follow-up.   Her concerns today include:  ABNORMAL UTERINE BLEEDING/FIBROIDS: ANEMIA: Reports scheduled for Gynecology consultation on 04/13/2021 related to history of fibroids. Will be scheduled for hysterectomy about 1 month later. Vaginal bleeding since 03/01/2021. Reports Gynecology requesting updated hemoglobin A1c and blood glucose. She had breakfast prior to today's appointment.   Ongoing anemia causing fatigue, followed by Gastroenterology. Scheduled for two iron infusions and an appointment with Radiology within the next few weeks. Reports told from a doctor in the past that she was taking too much iron and that it wasn't absorbing. As a result was told to take 1 tablet of iron with orange juice for absorption. Currently living with a friend because of fatigue and also unable to work at the moment.   3. HISTORY OF HYPERTHYROIDISM: Reports thyroid issues since 51 years-old and diagnosed with Grave's disease. Reports thyroid levels fluctuate from high to low. Reports seeing a doctor in the past who says she will progrshoukdress to Hashimoto's potentially. Reports taking Methimazole for management in the past. Declined referral to Endocrinology at the moment preferring to recheck thyroid levels in a few months prior to referral.   4. FROZEN SHOULDER: 02/17/2021 per MD note: ?frozen shoulder. Referral to ortho for further eval/mgt  03/30/2021: Reports has an appointment for frozen shoulder.   5. ANXIETY DEPRESSION FOLLOW-UP: 02/17/2021 per MD note: Lexapro 10 mg daily prescribed. Will monitor.  03/30/2021: Reports did not take Lexapro. Reports she does not need the medication but would rather have medical issues corrected first. She also routinely sees a counselor.  6. PHYSICAL EXAM: Patient declined  today. Patient declined additional blood pressure check from provider reporting she already knows her blood pressure is high.   Patient Active Problem List   Diagnosis Date Noted   Hypertension 12/03/2020   Uncontrolled type 2 diabetes mellitus with hyperglycemia ([place]) 12/03/2020   Uterine mass 12/03/2020   Abnormal uterine bleeding (AUB) 11/11/2020   History of uterine fibroid 11/11/2020   Iron deficiency anemia due to chronic blood loss 11/11/2020   History of menorrhagia 11/11/2020     Current Outpatient Medications on File Prior to Visit  Medication Sig Dispense Refill   acetaminophen (TYLENOL) 325 MG tablet Take 650 mg by mouth every 6 (six) hours as needed for moderate pain.     amLODipine (NORVASC) 10 MG tablet Take 1 tablet (10 mg total) by mouth daily. 90 tablet 0   ferrous sulfate 325 (65 FE) MG tablet Take 325 mg by mouth 2 (two) times daily with a meal.     fluticasone (VERAMYST) 27.5 MCG/SPRAY nasal spray Place 1 spray into the nose daily.     glipiZIDE (GLUCOTROL) 10 MG tablet Take 1 tablet (10 mg total) by mouth 2 (two) times daily before a meal. 180 tablet 0   hydrochlorothiazide (HYDRODIURIL) 25 MG tablet Take 1 tablet (25 mg total) by mouth daily. 90 tablet 0   lisinopril (ZESTRIL) 40 MG tablet Take 1 tablet (40 mg total) by mouth daily. 90 tablet 0   medroxyPROGESTERone (PROVERA) 10 MG tablet Take 1 tablet (10 mg total) by mouth daily. 30 tablet 2   metFORMIN (GLUCOPHAGE) 1000 MG tablet Take 1 tablet (1,000 mg total) by mouth 2 (two) times daily with a meal. 180 tablet 0   metoprolol succinate (TOPROL-XL) 25  MG 24 hr tablet Take 1 tablet (25 mg total) by mouth daily. 90 tablet 0   olmesartan (BENICAR) 40 MG tablet Take 40 mg by mouth daily.     omeprazole (PRILOSEC) 20 MG capsule Take 20 mg by mouth daily.     ondansetron (ZOFRAN ODT) 4 MG disintegrating tablet Take 1 tablet (4 mg total) by mouth every 8 (eight) hours as needed for nausea or vomiting. 20 tablet 0    tranexamic acid (LYSTEDA) 650 MG TABS tablet Take 2 tablets (1,300 mg total) by mouth 3 (three) times daily. Take during menses for a maximum of five days 30 tablet 2   No current facility-administered medications on file prior to visit.    No Known Allergies  Social History   Socioeconomic History   Marital status: Single    Spouse name: Not on file   Number of children: Not on file   Years of education: Not on file   Highest education level: Not on file  Occupational History   Not on file  Tobacco Use   Smoking status: Never   Smokeless tobacco: Never  Vaping Use   Vaping Use: Never used  Substance and Sexual Activity   Alcohol use: Not Currently   Drug use: Never   Sexual activity: Not Currently  Other Topics Concern   Not on file  Social History Narrative   Not on file   Social Determinants of Health   Financial Resource Strain: Not on file  Food Insecurity: Food Insecurity Present   Worried About [place] in the Last Year: Often true   Ran Out of Food in the Last Year: Sometimes true  Transportation Needs: Unmet Transportation Needs   Lack of Transportation (Medical): Yes   Lack of Transportation (Non-Medical): Yes  Physical Activity: Not on file  Stress: Not on file  Social Connections: Not on file  Intimate Partner Violence: Not on file    Family History  Problem Relation Age of Onset   Endometrial cancer Maternal Aunt    Colon cancer Neg Hx    Breast cancer Neg Hx    Ovarian cancer Neg Hx    Pancreatic cancer Neg Hx    Prostate cancer Neg Hx     Past Surgical History:  Procedure Laterality Date   BIOPSY THYROID      ROS: Review of Systems Negative except as stated above  PHYSICAL EXAM: BP (!) 166/87 (BP Location: Left Arm, Patient Position: Sitting, Cuff Size: Normal)    Pulse 77    Temp 98.3 F (36.8 C)    Resp 18    Ht 5' 5.98" (1.676 m)    Wt 174 lb 9.6 oz (79.2 kg)    SpO2 99%    BMI 28.19 kg/m   Physical Exam HENT:      Head: Normocephalic and atraumatic.  Eyes:     Extraocular Movements: Extraocular movements intact.     Conjunctiva/sclera: Conjunctivae normal.     Pupils: Pupils are equal, round, and reactive to light.  Cardiovascular:     Rate and Rhythm: Normal rate and regular rhythm.     Pulses: Normal pulses.     Heart sounds: Normal heart sounds.  Pulmonary:     Effort: Pulmonary effort is normal.     Breath sounds: Normal breath sounds.  Musculoskeletal:     Cervical back: Normal range of motion and neck supple.  Neurological:     General: No focal deficit present.  Mental Status: She is alert and oriented to person, place, and time.  Psychiatric:        Mood and Affect: Affect is angry.        Behavior: Behavior is agitated.    Results for orders placed or performed in visit on 03/30/21  POCT glucose (manual entry)  Result Value Ref Range   POC Glucose 118 (A) 70 - 99 mg/dl  POCT glycosylated hemoglobin (Hb A1C)  Result Value Ref Range   Hemoglobin A1C 7.2 (A) 4.0 - 5.6 %   HbA1c POC (<> result, manual entry)     HbA1c, POC (prediabetic range)     HbA1c, POC (controlled diabetic range)      ASSESSMENT AND PLAN: 1. Abnormal uterine bleeding (AUB): 2. History of uterine fibroid: 3. Iron deficiency anemia due to chronic blood loss: - Keep all scheduled appointments with Gynecology.  - Keep all scheduled appointments with Gastroenterology.   4. Type 2 diabetes mellitus with other specified complication, without long-term current use of insulin ([place]): - Hemoglobin A1c today slightly above goal at 7.2%, goal < 7%. This is increased from previous 6.7% on 02/17/2021.  - Non-fasting CBG 118, had breakfast food prior to appointment today.  - Will continue with current regimen established with her primary provider.  - Follow-up with primary provider as scheduled. - POCT glucose (manual entry) - POCT glycosylated hemoglobin (Hb A1C)  5. History of hyperthyroidism: - TSH normal on  02/17/2021. - Patient declined referral to Endocrinology.  - Follow-up with primary provider as scheduled.  6. Left shoulder pain, unspecified chronicity: - Keep all scheduled appointments with Orthopedics.   7. Anxiety and depression: - Patient denies thoughts of self-harm, suicidal ideations, homicidal ideations. - Patient declined pharmacological therapy.  - Keep all scheduled appointments with counseling.  - Follow-up with primary provider as scheduled.   8. Annual physical exam: - Patient declined.     Patient was given the opportunity to ask questions.  Patient verbalized understanding of the plan and was able to repeat key elements of the plan. Patient was given clear instructions to go to Emergency Department or return to medical center if symptoms don't improve, worsen, or new problems develop.The patient verbalized understanding.   Orders Placed This Encounter  Procedures   POCT glucose (manual entry)   POCT glycosylated hemoglobin (Hb A1C)    Follow-up with primary provider as scheduled.  Camillia Herter, NP

## 2021-03-30 ENCOUNTER — Encounter: Payer: Self-pay | Admitting: Family

## 2021-03-30 ENCOUNTER — Other Ambulatory Visit: Payer: Self-pay

## 2021-03-30 ENCOUNTER — Ambulatory Visit (INDEPENDENT_AMBULATORY_CARE_PROVIDER_SITE_OTHER): Payer: Managed Care, Other (non HMO) | Admitting: Family

## 2021-03-30 VITALS — BP 166/87 | HR 77 | Temp 98.3°F | Resp 18 | Ht 65.98 in | Wt 174.6 lb

## 2021-03-30 DIAGNOSIS — Z0000 Encounter for general adult medical examination without abnormal findings: Secondary | ICD-10-CM

## 2021-03-30 DIAGNOSIS — M25512 Pain in left shoulder: Secondary | ICD-10-CM | POA: Diagnosis not present

## 2021-03-30 DIAGNOSIS — D500 Iron deficiency anemia secondary to blood loss (chronic): Secondary | ICD-10-CM

## 2021-03-30 DIAGNOSIS — F32A Depression, unspecified: Secondary | ICD-10-CM

## 2021-03-30 DIAGNOSIS — Z86018 Personal history of other benign neoplasm: Secondary | ICD-10-CM

## 2021-03-30 DIAGNOSIS — N939 Abnormal uterine and vaginal bleeding, unspecified: Secondary | ICD-10-CM | POA: Diagnosis not present

## 2021-03-30 DIAGNOSIS — F419 Anxiety disorder, unspecified: Secondary | ICD-10-CM

## 2021-03-30 DIAGNOSIS — Z8639 Personal history of other endocrine, nutritional and metabolic disease: Secondary | ICD-10-CM

## 2021-03-30 DIAGNOSIS — E1169 Type 2 diabetes mellitus with other specified complication: Secondary | ICD-10-CM | POA: Diagnosis not present

## 2021-03-30 LAB — POCT GLYCOSYLATED HEMOGLOBIN (HGB A1C): Hemoglobin A1C: 7.2 % — AB (ref 4.0–5.6)

## 2021-03-30 LAB — GLUCOSE, POCT (MANUAL RESULT ENTRY): POC Glucose: 118 mg/dl — AB (ref 70–99)

## 2021-03-30 NOTE — Progress Notes (Signed)
Pt presents for glucose testing and A1c check for surgery in February -hysterectomy

## 2021-03-30 NOTE — Progress Notes (Signed)
Diabetes discussed in office.

## 2021-04-01 ENCOUNTER — Ambulatory Visit (HOSPITAL_BASED_OUTPATIENT_CLINIC_OR_DEPARTMENT_OTHER): Payer: Managed Care, Other (non HMO) | Attending: Orthopaedic Surgery | Admitting: Physical Therapy

## 2021-04-01 ENCOUNTER — Encounter (HOSPITAL_BASED_OUTPATIENT_CLINIC_OR_DEPARTMENT_OTHER): Payer: Self-pay | Admitting: Physical Therapy

## 2021-04-01 ENCOUNTER — Other Ambulatory Visit: Payer: Self-pay

## 2021-04-01 DIAGNOSIS — M25512 Pain in left shoulder: Secondary | ICD-10-CM | POA: Insufficient documentation

## 2021-04-01 DIAGNOSIS — M25612 Stiffness of left shoulder, not elsewhere classified: Secondary | ICD-10-CM | POA: Diagnosis not present

## 2021-04-01 DIAGNOSIS — M6281 Muscle weakness (generalized): Secondary | ICD-10-CM | POA: Insufficient documentation

## 2021-04-01 NOTE — Therapy (Signed)
OUTPATIENT PHYSICAL THERAPY SHOULDER EVALUATION   Patient Name: Crystal Vasquez MRN: [id number] DOB:[birth year], 51 y.o., female Today's Date: 04/01/2021   PT End of Session - 04/01/21 1120     Visit Number 1    Number of Visits 12    Date for PT Re-Evaluation 05/13/21    Progress Note Due on Visit --   05/02/2021   PT Start Time 1021    PT Stop Time 1109    PT Time Calculation (min) 48 min    Activity Tolerance Patient tolerated treatment well    Behavior During Therapy [facility] for tasks assessed/performed             Past Medical History:  Diagnosis Date   DM2    Hypertension    Obesity (BMI 30-39.9)    Thyroid disease    Past Surgical History:  Procedure Laterality Date   BIOPSY THYROID     Patient Active Problem List   Diagnosis Date Noted   Hypertension 12/03/2020   Uncontrolled type 2 diabetes mellitus with hyperglycemia ([place]) 12/03/2020   Uterine mass 12/03/2020   Abnormal uterine bleeding (AUB) 11/11/2020   History of uterine fibroid 11/11/2020   Iron deficiency anemia due to chronic blood loss 11/11/2020   History of menorrhagia 11/11/2020    PCP: Camillia Herter, NP  REFERRING PROVIDER: Vanetta Mulders, MD  REFERRING DIAG: [phone] (ICD-10-CM) - Left shoulder pain, unspecified chronicity   THERAPY DIAG:  Left shoulder pain, unspecified chronicity  Stiffness of left shoulder, not elsewhere classified  Muscle weakness (generalized)   ONSET DATE: April/May 2023  SUBJECTIVE:                                                                                                                                                                                      SUBJECTIVE STATEMENT: Pt began having pain in April/May with no specific MOI.  Pt went to a MD who informed her she had frozen shoulder and she probable needs surgery.  Pt was referred to ortho MD and his note indicated consistent with adhesive capsulitis.  Pt received 2 US guided shoulder  injections and reports a good improvement in sx's after injections.  Pt states she could sleep better after the injections.  MD ordered PT and MD script indicated ROM/mobility for L shoulder. Pt states she has improved with reaching overhead but is limited.  She has much difficulty with reaching behind back.  Pt has pain and difficulty with dressing and bathing.  Pt has difficulty with household chores and reaching activities. Pt uses her R UE primarily with activities.      PERTINENT HISTORY: DM, anemia, Hx of fibroid  tumors  PAIN:  Are you having pain? None at rest NPRS scale: 0/10 current at rest, 5/10 reaching behind her back, 11-12/10 worst  Pain location: L shoulder- primarily ant and superiorly Aggravating factors: reaching behind back Relieving factors: US guided injections  PRECAUTIONS: None  WEIGHT BEARING RESTRICTIONS No  FALLS:  Has patient fallen in last 6 months? No     OCCUPATION: Pt not working currently due to future surgery in Feb.     PLOF: Independent; Pt was able to perform her ADLs/IADLs and normal reaching/overhead activities independently without difficulty or pain.  PATIENT GOALS To be able to reach behind her back and overhead with reduced pain and improved stiffness  OBJECTIVE:   DIAGNOSTIC FINDINGS:  X ray:  Normal  PATIENT SURVEYS:  FOTO 63 with a goal of 8  COGNITION:  Overall cognitive status: Within functional limits for tasks assessed    Pt is R hand dominant     UPPER EXTREMITY AROM/PROM:  A/PROM Right 04/01/2021 Left 04/01/2021  Shoulder flexion 172 151 with pain  Shoulder scaption 171 145 with pain  Shoulder abduction 151 118 with pain  Shoulder adduction    Shoulder internal rotation 73 45/47 with pain  Shoulder external rotation 65 32/35 with pain  Elbow flexion    Elbow extension    Wrist flexion    Wrist extension    Wrist ulnar deviation    Wrist radial deviation    Wrist pronation    Wrist supination    (Blank  rows = not tested)  UPPER EXTREMITY MMT:  MMT Right 04/01/2021 Left 04/01/2021  Shoulder flexion 5/5 4+/5 with pain  Shoulder scaption 5/5 5/5 with pain  Shoulder abduction 4/5 4+/5 with pain  Shoulder ER 4+/5 Weak; tolerated min resistance with pain  Shoulder IR 4-/5 4/5 with pain  Lower trapezius    Elbow flexion    Elbow extension    Wrist flexion    Wrist extension    Wrist ulnar deviation    Wrist radial deviation    Wrist pronation    Wrist supination    Grip strength (lbs)    (Blank rows = not tested)  SHOULDER SPECIAL TESTS:  Neer's Impingement Test:  R: negative, L: positive  Hawkin's Kennedy Impingement Test:  R: negative, L: positive      TODAY'S TREATMENT:  Pt performed supine wand flexion x 10 reps, supine wand ER x10, and standing wand IR AAROM X 10 reps.  Pt received a HEP handout and was educated in correct form and appropriate frequency.   See below for pt education.     PATIENT EDUCATION: Education details: dx, prognosis, relevant anatomy, POC, and rationale of exercises.  Pt received a HEP handout and was educated in correct form and appropriate frequency.  Pt instructed to not perform exercises into a painful range.  Instructed pt to use ice if she has increased pain or soreness after Rx.   Person educated: Patient Education method: Explanation, Demonstration, Tactile cues, Verbal cues, and Handouts Education comprehension: verbalized understanding, returned demonstration, verbal cues required, tactile cues required, and needs further education   HOME EXERCISE PROGRAM: Access Code: [email or web address] URL: [email or web address] Date: 04/02/2021 Prepared by: Ronny Flurry  Exercises Supine Shoulder External Rotation in 45 Degrees Abduction AAROM with Dowel - 2 x daily - 7 x weekly - 2 sets - 10 reps Supine Shoulder Wand flexion AAROM - 2 x daily - 7 x weekly - 2 sets - 10 reps Standing Bilateral Shoulder Internal Rotation  AAROM with Dowel - 2 x  daily - 7 x weekly - 2 sets - 10 reps   ASSESSMENT:  CLINICAL IMPRESSION: Patient is a 51 y.o. female with a dx of L shoulder pain and MD stating consistent findings of adhesive capsulitis presenting to the clinic with L shoulder pain, limited ROM in L shoulder, muscle weakness in L UE.  Pt reports improved pain and sx's since receiving US guided injections.  Pt has pain and limitations with reaching activities including behind back and overhead.  Pt has pain and difficulty with ADLs including dressing and bathing and IADLs including household chores.  Pt uses her R UE primarily with activities.  Patient should benefit from skilled PT to address above impairments and improve overall function.    Objective impairments include decreased activity tolerance, decreased mobility, decreased ROM, decreased strength, hypomobility, impaired flexibility, impaired UE functional use, and pain. These impairments are limiting patient from cleaning, laundry, and reaching, dressing, bathing . Personal factors including 1 comorbidity: DM  are also affecting patient's functional outcome.   REHAB POTENTIAL: Good  CLINICAL DECISION MAKING: Stable/uncomplicated  EVALUATION COMPLEXITY: Low   GOALS:   SHORT TERM GOALS:  STG Name Target Date Goal status  1 Pt will be independent and compliant with HEP for improved pain, ROM, strength, and function.  Baseline:  04/22/2021 INITIAL  2 Pt will demo improved R shoulder scaption, abd, ER, and IR AROM by 10-15 deg for improved stiffness and mobility.   Baseline:  04/29/2021 INITIAL  3 Pt will report at least a 25% increase in functional usage of R UE with daily activities.  Baseline: 04/22/2021 INITIAL  LONG TERM GOALS:   LTG Name Target Date Goal status  1 Pt will report she is able to reach behind back without significant pain or limitation.  Baseline: 05/13/2021 INITIAL  2 Pt will be able to perform her normal reaching activities and household chores without  significant limitation.  Baseline: 05/13/2021 INITIAL  3 Pt will be able to perform her normal overhead activities without significant pain or difficulty.  Baseline: 05/13/2021 INITIAL  4 Pt will demo at least 55-60 deg of ER and IR AROM for improved reaching behind head and back.   Baseline: 05/13/2021 INITIAL                  PLAN: PT FREQUENCY: 2x/week  PT DURATION: 6 weeks  PLANNED INTERVENTIONS: Therapeutic exercises, Therapeutic activity, Neuro Muscular re-education, Patient/Family education, Joint mobilization, Aquatic Therapy, Electrical stimulation, Spinal mobilization, Cryotherapy, Moist heat, Taping, Ultrasound, and Manual therapy  PLAN FOR NEXT SESSION: Review and perform HEP, shoulder ROM and Jt mobs.   Selinda Michaels III PT, DPT 04/02/21 7:58 AM

## 2021-04-02 DIAGNOSIS — M25612 Stiffness of left shoulder, not elsewhere classified: Secondary | ICD-10-CM | POA: Diagnosis not present

## 2021-04-02 DIAGNOSIS — M6281 Muscle weakness (generalized): Secondary | ICD-10-CM | POA: Diagnosis not present

## 2021-04-02 DIAGNOSIS — M25512 Pain in left shoulder: Secondary | ICD-10-CM | POA: Diagnosis present

## 2021-04-04 ENCOUNTER — Other Ambulatory Visit: Payer: Self-pay

## 2021-04-04 ENCOUNTER — Inpatient Hospital Stay: Payer: Managed Care, Other (non HMO)

## 2021-04-04 VITALS — BP 124/70 | HR 56 | Temp 98.8°F | Resp 18

## 2021-04-04 DIAGNOSIS — N920 Excessive and frequent menstruation with regular cycle: Secondary | ICD-10-CM | POA: Diagnosis not present

## 2021-04-04 DIAGNOSIS — D500 Iron deficiency anemia secondary to blood loss (chronic): Secondary | ICD-10-CM

## 2021-04-04 MED ORDER — SODIUM CHLORIDE 0.9 % IV SOLN
510.0000 mg | Freq: Once | INTRAVENOUS | Status: AC
  Administered 2021-04-04: 510 mg via INTRAVENOUS
  Filled 2021-04-04: qty 510

## 2021-04-04 MED ORDER — SODIUM CHLORIDE 0.9 % IV SOLN: Freq: Once | INTRAVENOUS | Status: AC

## 2021-04-04 NOTE — Patient Instructions (Signed)

## 2021-04-04 NOTE — Progress Notes (Signed)
Patient observed for 30 minutes following administration of feraheme infusion. Vital signs retaken and remained stable. Patient showed no signs of distress.

## 2021-04-04 NOTE — Therapy (Addendum)
OUTPATIENT PHYSICAL THERAPY TREATMENT NOTE   Patient Name: Crystal Vasquez MRN: [id number] DOB:24-Oct-1970, 51 y.o., female Today's Date: 04/05/2021  PCP: Camillia Herter, NP REFERRING PROVIDER: Camillia Herter, NP   PT End of Session - 04/05/21 1348     Visit Number 2    Number of Visits 12    Date for PT Re-Evaluation 05/13/21    Progress Note Due on Visit --   05/02/2021   PT Start Time 1348    PT Stop Time 1427    PT Time Calculation (min) 39 min    Activity Tolerance Patient tolerated treatment well    Behavior During Therapy [facility] for tasks assessed/performed             Past Medical History:  Diagnosis Date   DM2    Hypertension    Obesity (BMI 30-39.9)    Thyroid disease    Past Surgical History:  Procedure Laterality Date   BIOPSY THYROID     Patient Active Problem List   Diagnosis Date Noted   Hypertension 12/03/2020   Uncontrolled type 2 diabetes mellitus with hyperglycemia ([place]) 12/03/2020   Uterine mass 12/03/2020   Abnormal uterine bleeding (AUB) 11/11/2020   History of uterine fibroid 11/11/2020   Iron deficiency anemia due to chronic blood loss 11/11/2020   History of menorrhagia 11/11/2020    THERAPY DIAG:  Left shoulder pain, unspecified chronicity  Stiffness of left shoulder, not elsewhere classified  Muscle weakness (generalized)   PCP: Camillia Herter, NP   REFERRING PROVIDER: Vanetta Mulders, MD   REFERRING DIAG: [phone] (ICD-10-CM) - Left shoulder pain, unspecified chronicity       ONSET DATE: April/May 2023   SUBJECTIVE:                                                                                                                                                                                       SUBJECTIVE STATEMENT: States she has discomfort with exercises but she hasn't done her exercises much since her last session.  Pt began having pain in April/May with no specific MOI.  Pt went to a MD who informed her she had  frozen shoulder and she probable needs surgery.  Pt was referred to ortho MD and his note indicated consistent with adhesive capsulitis.  Pt received 2 US guided shoulder injections and reports a good improvement in sx's after injections.  Pt states she could sleep better after the injections.  MD ordered PT and MD script indicated ROM/mobility for L shoulder. Pt states she has improved with reaching overhead but is limited.  She has much difficulty with reaching behind back.  Pt  has pain and difficulty with dressing and bathing.  Pt has difficulty with household chores and reaching activities. Pt uses her R UE primarily with activities.        PERTINENT HISTORY: DM, anemia, Hx of fibroid tumors   PAIN:  Are you having pain? None at rest NPRS scale: 0/10 current at rest, 5/10 reaching behind her back, 11-12/10 worst  Pain location: L shoulder- primarily ant and superiorly Aggravating factors: reaching behind back Relieving factors: US guided injections   PRECAUTIONS: None   WEIGHT BEARING RESTRICTIONS No   FALLS:  Has patient fallen in last 6 months? No        OCCUPATION: Pt not working currently due to future surgery in Feb.      PLOF: Independent; Pt was able to perform her ADLs/IADLs and normal reaching/overhead activities independently without difficulty or pain.   PATIENT GOALS To be able to reach behind her back and overhead with reduced pain and improved stiffness   OBJECTIVE:    DIAGNOSTIC FINDINGS:  X ray:  Normal   PATIENT SURVEYS:  FOTO 63 with a goal of 2   COGNITION:          Overall cognitive status: Within functional limits for tasks assessed                     Pt is R hand dominant               UPPER EXTREMITY AROM/PROM:   A/PROM Right 04/01/2021 Left 04/01/2021  Shoulder flexion 172 151 with pain  Shoulder scaption 171 145 with pain  Shoulder abduction 151 118 with pain  Shoulder adduction      Shoulder internal rotation 73 45/47 with pain   Shoulder external rotation 65 32/35 with pain  Elbow flexion      Elbow extension      Wrist flexion      Wrist extension      Wrist ulnar deviation      Wrist radial deviation      Wrist pronation      Wrist supination      (Blank rows = not tested)   UPPER EXTREMITY MMT:   MMT Right 04/01/2021 Left 04/01/2021  Shoulder flexion 5/5 4+/5 with pain  Shoulder scaption 5/5 5/5 with pain  Shoulder abduction 4/5 4+/5 with pain  Shoulder ER 4+/5 Weak; tolerated min resistance with pain  Shoulder IR 4-/5 4/5 with pain  Lower trapezius      Elbow flexion      Elbow extension      Wrist flexion      Wrist extension      Wrist ulnar deviation      Wrist radial deviation      Wrist pronation      Wrist supination      Grip strength (lbs)      (Blank rows = not tested)   SHOULDER SPECIAL TESTS:           Neer's Impingement Test:  R: negative, L: positive           Hawkin's Kennedy Impingement Test:  R: negative, L: positive                  TODAY'S TREATMENT:  04/05/21 Therapeutic Exercise:  Aerobic: Supine:dowel flexion 2 minutes, dowel protraction x2 1 minutes, Prone:  Seated: inferior glide in chair 2 minutes left, shoulder ER 2 minutes  Standing: shoulder extension with dowel 2 minutes, wall slides  2 minutes Neuromuscular Re-education: Manual Therapy: - traction of left shoulder with ROM, inferior glide with ROM grade II/III - improved ROM noted afterwards. Contract/relax left arm into ER/IR - tolerated well  Therapeutic Activity: Self Care: Trigger Point Dry Needling:  Modalities:   04/01/21 Pt performed supine wand flexion x 10 reps, supine wand ER x10, and standing wand IR AAROM X 10 reps.  Pt received a HEP handout and was educated in correct form and appropriate frequency.   See below for pt education.       PATIENT EDUCATION: Education details: dx, prognosis, relevant anatomy, POC, and rationale of exercises.  Pt received a HEP handout and was educated in  correct form and appropriate frequency.  Pt instructed to not perform exercises into a painful range.  Instructed pt to use ice if she has increased pain or soreness after Rx.   Person educated: Patient Education method: Explanation, Demonstration, Tactile cues, Verbal cues, and Handouts Education comprehension: verbalized understanding, returned demonstration, verbal cues required, tactile cues required, and needs further education     HOME EXERCISE PROGRAM: Access Code: [email or web address] URL: [email or web address] Date: 04/02/2021 Prepared by: Ronny Flurry   Exercises Supine Shoulder External Rotation in 45 Degrees Abduction AAROM with Dowel - 2 x daily - 7 x weekly - 2 sets - 10 reps Supine Shoulder Wand flexion AAROM - 2 x daily - 7 x weekly - 2 sets - 10 reps Standing Bilateral Shoulder Internal Rotation AAROM with Dowel - 2 x daily - 7 x weekly - 2 sets - 10 reps     ASSESSMENT:   CLINICAL IMPRESSION:  Patient tolerated traction well but had slight dizziness with standing exercise. Rested and symptoms abolished. Added new exercises to HEP. Will continue with current POC. Improved external rotation noted after manual work. No increase in symptoms noted end of session.    Objective impairments include decreased activity tolerance, decreased mobility, decreased ROM, decreased strength, hypomobility, impaired flexibility, impaired UE functional use, and pain. These impairments are limiting patient from cleaning, laundry, and reaching, dressing, bathing . Personal factors including 1 comorbidity: DM  are also affecting patient's functional outcome.    REHAB POTENTIAL: Good   CLINICAL DECISION MAKING: Stable/uncomplicated   EVALUATION COMPLEXITY: Low     GOALS:     SHORT TERM GOALS:   STG Name Target Date Goal status  1 Pt will be independent and compliant with HEP for improved pain, ROM, strength, and function.  Baseline:  04/22/2021 INITIAL  2 Pt will demo improved R  shoulder scaption, abd, ER, and IR AROM by 10-15 deg for improved stiffness and mobility.   Baseline:  04/29/2021 INITIAL  3 Pt will report at least a 25% increase in functional usage of R UE with daily activities.  Baseline: 04/22/2021 INITIAL  LONG TERM GOALS:    LTG Name Target Date Goal status  1 Pt will report she is able to reach behind back without significant pain or limitation.  Baseline: 05/13/2021 INITIAL  2 Pt will be able to perform her normal reaching activities and household chores without significant limitation.  Baseline: 05/13/2021 INITIAL  3 Pt will be able to perform her normal overhead activities without significant pain or difficulty.  Baseline: 05/13/2021 INITIAL  4 Pt will demo at least 55-60 deg of ER and IR AROM for improved reaching behind head and back.   Baseline: 05/13/2021 INITIAL  PLAN: PT FREQUENCY: 2x/week   PT DURATION: 6 weeks   PLANNED INTERVENTIONS: Therapeutic exercises, Therapeutic activity, Neuro Muscular re-education, Patient/Family education, Joint mobilization, Aquatic Therapy, Electrical stimulation, Spinal mobilization, Cryotherapy, Moist heat, Taping, Ultrasound, and Manual therapy   PLAN FOR NEXT SESSION: continue with shoulder ROM, mobilizations and strengthening     1:48 PM, 04/05/21 Jerene Pitch, DPT Physical Therapy with [facility]  [phone] office  PHYSICAL THERAPY DISCHARGE SUMMARY  Visits from Start of Care: 2  Current functional level related to goals / functional outcomes: Unable to assess due to pt not being present at discharge.     Remaining deficits: Unable to assess due to pt not being present at discharge.    Education / Equipment: Pt has a HEP   PT received a message that pt cancelled 4 appointments due to financial reasons.  Pt will be considered discharged at this time due to self discharge.    Selinda Michaels III PT, DPT 12/29/21 10:41 AM

## 2021-04-05 ENCOUNTER — Ambulatory Visit (HOSPITAL_BASED_OUTPATIENT_CLINIC_OR_DEPARTMENT_OTHER): Payer: Managed Care, Other (non HMO) | Admitting: Physical Therapy

## 2021-04-05 ENCOUNTER — Encounter (HOSPITAL_BASED_OUTPATIENT_CLINIC_OR_DEPARTMENT_OTHER): Payer: Self-pay | Admitting: Physical Therapy

## 2021-04-05 DIAGNOSIS — M25512 Pain in left shoulder: Secondary | ICD-10-CM

## 2021-04-05 DIAGNOSIS — M25612 Stiffness of left shoulder, not elsewhere classified: Secondary | ICD-10-CM

## 2021-04-05 DIAGNOSIS — M6281 Muscle weakness (generalized): Secondary | ICD-10-CM

## 2021-04-07 ENCOUNTER — Encounter (HOSPITAL_BASED_OUTPATIENT_CLINIC_OR_DEPARTMENT_OTHER): Payer: Self-pay

## 2021-04-07 ENCOUNTER — Encounter (HOSPITAL_BASED_OUTPATIENT_CLINIC_OR_DEPARTMENT_OTHER): Payer: Managed Care, Other (non HMO) | Admitting: Physical Therapy

## 2021-04-08 ENCOUNTER — Telehealth: Payer: Self-pay

## 2021-04-08 ENCOUNTER — Ambulatory Visit: Payer: Managed Care, Other (non HMO) | Admitting: Clinical

## 2021-04-08 DIAGNOSIS — F419 Anxiety disorder, unspecified: Secondary | ICD-10-CM

## 2021-04-08 DIAGNOSIS — Z658 Other specified problems related to psychosocial circumstances: Secondary | ICD-10-CM

## 2021-04-08 NOTE — Patient Instructions (Signed)
[facility] [address] [place],  [address] [phone] (main office) [phone] (Uchechukwu Dhawan's office)   

## 2021-04-08 NOTE — Telephone Encounter (Signed)
Spoke with Crystal Vasquez this afternoon regarding her upcoming surgery. Her surgery has tentatively been scheduled for 05/03/21 but may need to be adjusted based on IR availability. Advised patient that she will be receiving a call from [facility] to arrange a pre-admission appointment prior to her procedure. Patient verbalized understanding. Confirmed her pre-op appointment with Joylene John, NP for 04/13/21 at 1:30 pm. Instructed to call with any needs.

## 2021-04-11 ENCOUNTER — Encounter: Payer: Self-pay | Admitting: *Deleted

## 2021-04-11 ENCOUNTER — Ambulatory Visit
Admission: RE | Admit: 2021-04-11 | Discharge: 2021-04-11 | Disposition: A | Discharge status: Home / Self Care | Payer: Managed Care, Other (non HMO) | Source: Ambulatory Visit | Attending: Gynecologic Oncology | Admitting: Gynecologic Oncology

## 2021-04-11 ENCOUNTER — Other Ambulatory Visit: Payer: Self-pay

## 2021-04-11 ENCOUNTER — Inpatient Hospital Stay: Payer: Managed Care, Other (non HMO)

## 2021-04-11 VITALS — BP 130/68 | HR 74 | Temp 98.8°F | Resp 16

## 2021-04-11 DIAGNOSIS — N939 Abnormal uterine and vaginal bleeding, unspecified: Secondary | ICD-10-CM

## 2021-04-11 DIAGNOSIS — D500 Iron deficiency anemia secondary to blood loss (chronic): Secondary | ICD-10-CM

## 2021-04-11 DIAGNOSIS — N920 Excessive and frequent menstruation with regular cycle: Secondary | ICD-10-CM | POA: Diagnosis not present

## 2021-04-11 DIAGNOSIS — D259 Leiomyoma of uterus, unspecified: Secondary | ICD-10-CM

## 2021-04-11 HISTORY — PX: IR RADIOLOGIST EVAL & MGMT: IMG5224

## 2021-04-11 MED ORDER — SODIUM CHLORIDE 0.9 % IV SOLN: Freq: Once | INTRAVENOUS | Status: AC

## 2021-04-11 MED ORDER — SODIUM CHLORIDE 0.9 % IV SOLN
510.0000 mg | Freq: Once | INTRAVENOUS | Status: AC
  Administered 2021-04-11: 510 mg via INTRAVENOUS
  Filled 2021-04-11: qty 510

## 2021-04-11 NOTE — Consult Note (Signed)
Chief Complaint: Patient was seen in consultation today for prolapsing uterine fibroid/polyp  at the request of Tucker,Katherine R  Referring Physician(s): Tucker,Katherine R  History of Present Illness: Crystal Vasquez is a 51 y.o. female With history of chronic history of extensive vaginal bleeding, most recently requiring IV iron transfusion.  Prior MRI performed on 12/19/2020 showed a large prolapsing mass, likely a fibroid.  Subsequent biopsy of this mass suggested that it may be a polyp.    She states that her bleeding may happen at any time during the month.  She also has low back pain and cramping which limits her ability to exercise.  She also reports mass effect on the bladder.    She is planning to undergo hysterectomy with Dr. Berline Lopes.  I am seeing her in clinic for consultation for uterine artery embolization prior to hysterectomy.   Past Medical History:  Diagnosis Date   DM2    Hypertension    Obesity (BMI 30-39.9)    Thyroid disease     Past Surgical History:  Procedure Laterality Date   BIOPSY THYROID     IR RADIOLOGIST EVAL & MGMT  04/11/2021    Allergies: Patient has no known allergies.  Medications: Prior to Admission medications   Medication Sig Start Date End Date Taking? Authorizing Provider  acetaminophen (TYLENOL) 325 MG tablet Take 650 mg by mouth every 6 (six) hours as needed for moderate pain.    [provider]  amLODipine (NORVASC) 10 MG tablet Take 1 tablet (10 mg total) by mouth daily. 02/18/21   Dorna Mai, MD  ferrous sulfate 325 (65 FE) MG tablet Take 325 mg by mouth 2 (two) times daily with a meal.    [provider]  fluticasone (VERAMYST) 27.5 MCG/SPRAY nasal spray Place 1 spray into the nose daily.    [provider]  glipiZIDE (GLUCOTROL) 10 MG tablet Take 1 tablet (10 mg total) by mouth 2 (two) times daily before a meal. 02/18/21   Dorna Mai, MD  hydrochlorothiazide (HYDRODIURIL) 25 MG tablet  Take 1 tablet (25 mg total) by mouth daily. 02/18/21   Dorna Mai, MD  lisinopril (ZESTRIL) 40 MG tablet Take 1 tablet (40 mg total) by mouth daily. 02/18/21   Dorna Mai, MD  medroxyPROGESTERone (PROVERA) 10 MG tablet Take 1 tablet (10 mg total) by mouth daily. 12/03/20   Lafonda Mosses, MD  metFORMIN (GLUCOPHAGE) 1000 MG tablet Take 1 tablet (1,000 mg total) by mouth 2 (two) times daily with a meal. 02/18/21   Dorna Mai, MD  metoprolol succinate (TOPROL-XL) 25 MG 24 hr tablet Take 1 tablet (25 mg total) by mouth daily. 02/18/21   Dorna Mai, MD  olmesartan (BENICAR) 40 MG tablet Take 40 mg by mouth daily.    [provider]  omeprazole (PRILOSEC) 20 MG capsule Take 20 mg by mouth daily.    [provider]  ondansetron (ZOFRAN ODT) 4 MG disintegrating tablet Take 1 tablet (4 mg total) by mouth every 8 (eight) hours as needed for nausea or vomiting. 10/27/20   Prosperi, Christian H, PA-C  tranexamic acid (LYSTEDA) 650 MG TABS tablet Take 2 tablets (1,300 mg total) by mouth 3 (three) times daily. Take during menses for a maximum of five days 11/11/20   Aletha Halim, MD     Family History  Problem Relation Age of Onset   Endometrial cancer Maternal Aunt    Colon cancer Neg Hx    Breast cancer Neg Hx  Ovarian cancer Neg Hx    Pancreatic cancer Neg Hx    Prostate cancer Neg Hx     Social History   Socioeconomic History   Marital status: Single    Spouse name: Not on file   Number of children: Not on file   Years of education: Not on file   Highest education level: Not on file  Occupational History   Not on file  Tobacco Use   Smoking status: Never   Smokeless tobacco: Never  Vaping Use   Vaping Use: Never used  Substance and Sexual Activity   Alcohol use: Not Currently   Drug use: Never   Sexual activity: Not Currently  Other Topics Concern   Not on file  Social History Narrative   Not on file   Social Determinants of Health    Financial Resource Strain: Not on file  Food Insecurity: Food Insecurity Present   Worried About [place] in the Last Year: Often true   Ran Out of Food in the Last Year: Sometimes true  Transportation Needs: Unmet Transportation Needs   Lack of Transportation (Medical): Yes   Lack of Transportation (Non-Medical): Yes  Physical Activity: Not on file  Stress: Not on file  Social Connections: Not on file   Review of Systems: A 12 point ROS discussed and pertinent positives are indicated in the HPI above.  All other systems are negative.  Review of Systems  Vital Signs: BP (!) 151/76 (BP Location: Right Arm)    Pulse 78    SpO2 99%   Physical Exam Cardiovascular:     Rate and Rhythm: Normal rate and regular rhythm.     Heart sounds: Normal heart sounds.  Pulmonary:     Effort: Pulmonary effort is normal.     Breath sounds: Normal breath sounds.  Abdominal:     General: Abdomen is flat. Bowel sounds are normal.     Palpations: Abdomen is soft.  Skin:    General: Skin is warm and dry.  Neurological:     Mental Status: She is oriented to person, place, and time.  Psychiatric:        Mood and Affect: Mood normal.    Imaging: IR Radiologist Eval & Mgmt  Result Date: 04/11/2021 Please refer to notes tab for details about interventional procedure. (Op Note)   Labs:  CBC: Recent Labs    10/27/20 1340 11/11/20 1545 03/25/21 1446 03/29/21 1210  WBC 12.0* 5.8 9.4 8.0  HGB 10.5* 9.3* 9.7* 10.5*  HCT 33.4* 28.5* 32.0* 34.5*  PLT 330 430 430* 438*    COAGS: No results for input(s): INR, APTT in the last 8760 hours.  BMP: Recent Labs    10/20/20 0711 10/27/20 1340 03/29/21 1210  NA 137 136 139  K 3.8 3.6 3.7  CL 105 99 102  CO2 25 25 27   GLUCOSE 251* 358* 91  BUN 12 9 20   CALCIUM 9.0 9.2 10.0  CREATININE 0.66 0.70 0.72  GFRNONAA >60 >60 >60    LIVER FUNCTION TESTS: Recent Labs    10/20/20 0711 10/27/20 1340 03/29/21 1210  BILITOT 0.6 0.3  0.2*  AST 17 16 11*  ALT 15 17 13   ALKPHOS 51 63 48  PROT 6.9 7.7 7.6  ALBUMIN 3.4* 4.0 4.3    TUMOR MARKERS: No results for input(s): AFPTM, CEA, CA199, CHROMGRNA in the last 8760 hours.  Assessment and Plan:  51 year old woman with chronic, extensive vaginal bleeding secondary to prolapsing uterine mass  presents to IR clinic for consideration for uterine artery embolization prior to hysterectomy.    I believe the patient is appropriate for uterine artery embolization prior to hysterectomy.    I had a lengthy conversation with her regarding the anatomy, pathology, and treatment options for fibroids.    Regarding risks, specific risks discussed include: post-embolization syndrome, bleeding, infection, contrast reaction, kidney/artery injury, need for further surgery/procedure, including hysterectomy, need for hospitalization, cardiopulmonary collapse, death.    We also specifically discussed the possibility of dislodgement of the prolapsed uterine mass requiring D and C or more emergent hysterectomy.   We discussed an approximate 7% chance of early onset menopause, mostly in women >9 years of age.  We discussed the need to evaluate the ovarian arteries and in some cases the need to treat the fibroids via the ovarian arteries which can lead to a higher risk of early menopause.    Regarding post-embolization syndrome, I did let her know that this is essentially expected, with a typical prodromal syndrome lasting 4-7 days typically, and usually treated with medication/support such as hydration, rest, analgesics, PO nausea medications, and stool softeners.   After discussing, she would like to proceed with uterine artery embolization.   Plan:  - Tentatively to proceed with uterine artery embolization at first available, with Dr. Dwaine Gale   Thank you for this interesting consult.  I greatly enjoyed meeting Crystal Vasquez and look forward to participating in their care.  A copy of this  report was sent to the requesting provider on this date.  Electronically Signed: Paula Libra Cheyane Ayon 04/11/2021, 3:23 PM   I spent a total of  40 Minutes  in face to face in clinical consultation, greater than 50% of which was counseling/coordinating care for Uterine Artery Embolization.

## 2021-04-11 NOTE — Patient Instructions (Signed)

## 2021-04-12 ENCOUNTER — Other Ambulatory Visit (HOSPITAL_COMMUNITY): Payer: Self-pay | Admitting: Interventional Radiology

## 2021-04-12 ENCOUNTER — Telehealth: Payer: Self-pay

## 2021-04-12 DIAGNOSIS — D259 Leiomyoma of uterus, unspecified: Secondary | ICD-10-CM

## 2021-04-12 NOTE — Progress Notes (Signed)
Sent message, via epic in basket, requesting orders in epic from surgeon.  

## 2021-04-12 NOTE — Telephone Encounter (Signed)
Spoke with [facility] regarding her pre-op appointment with Joylene John, NP tomorrow 04/13/21. Surgery has been rescheduled for 05/18/21 and her pre-op appointment needs to be moved closer to her surgery date. Patient verbalized understanding. Pre-op has been rescheduled for 05/05/21 at 10:30 am. Patient is in agreement of date and time of appointment. Advised patient that I would reach out to pre-admissions to have her appointment on 04/26/21 moved to the same day as her appointment with [facility]. Patient verbalized understanding. Instructed to call with any needs.

## 2021-04-13 ENCOUNTER — Other Ambulatory Visit: Payer: Self-pay | Admitting: Gynecologic Oncology

## 2021-04-13 ENCOUNTER — Inpatient Hospital Stay: Payer: Managed Care, Other (non HMO) | Admitting: Gynecologic Oncology

## 2021-04-13 DIAGNOSIS — N858 Other specified noninflammatory disorders of uterus: Secondary | ICD-10-CM

## 2021-04-13 DIAGNOSIS — N939 Abnormal uterine and vaginal bleeding, unspecified: Secondary | ICD-10-CM

## 2021-04-15 ENCOUNTER — Ambulatory Visit (HOSPITAL_BASED_OUTPATIENT_CLINIC_OR_DEPARTMENT_OTHER): Payer: Managed Care, Other (non HMO) | Admitting: Orthopaedic Surgery

## 2021-04-18 ENCOUNTER — Encounter (HOSPITAL_BASED_OUTPATIENT_CLINIC_OR_DEPARTMENT_OTHER): Payer: Managed Care, Other (non HMO) | Admitting: Physical Therapy

## 2021-04-21 ENCOUNTER — Encounter (HOSPITAL_BASED_OUTPATIENT_CLINIC_OR_DEPARTMENT_OTHER): Payer: Self-pay | Admitting: Physical Therapy

## 2021-04-22 ENCOUNTER — Telehealth: Payer: Self-pay | Admitting: Clinical

## 2021-04-22 NOTE — Telephone Encounter (Signed)
Brief phone check, as agreed-upon; pt moving forward with surgery on 05/18/21; working with job temp agency to find temporary work to begin moving towards life goals. Pt agrees to brief phone check within 2 weeks post-surgery.

## 2021-04-25 ENCOUNTER — Encounter (HOSPITAL_BASED_OUTPATIENT_CLINIC_OR_DEPARTMENT_OTHER): Payer: Self-pay | Admitting: Physical Therapy

## 2021-04-26 ENCOUNTER — Encounter (HOSPITAL_COMMUNITY): Payer: Managed Care, Other (non HMO)

## 2021-04-27 ENCOUNTER — Encounter (HOSPITAL_BASED_OUTPATIENT_CLINIC_OR_DEPARTMENT_OTHER): Payer: Self-pay | Admitting: Physical Therapy

## 2021-05-02 ENCOUNTER — Encounter (HOSPITAL_BASED_OUTPATIENT_CLINIC_OR_DEPARTMENT_OTHER): Payer: Self-pay | Admitting: Physical Therapy

## 2021-05-02 NOTE — Patient Instructions (Signed)
DUE TO COVID-19 ONLY ONE VISITOR IS ALLOWED TO COME WITH YOU AND STAY IN THE WAITING ROOM ONLY DURING PRE OP AND PROCEDURE DAY OF SURGERY.            Your procedure is scheduled on: 05-18-21   Report to [facility] Main  Entrance   Report to admitting at       [place] AM     Call this number if you have problems the morning of surgery [phone]   Remember: Eat a light diet the day before surgery.  Examples including soups, broths, toast, yogurt, mashed potatoes.  Things to avoid include carbonated beverages (fizzy beverages), raw fruits and raw vegetables, or beans.   NO SOLID FOOD AFTER MIDNIGHT THE NIGHT PRIOR TO SURGERY. NOTHING BY MOUTH EXCEPT CLEAR LIQUIDS UNTIL     0530 AM . PLEASE FINISH g2  DRINK PER SURGEON ORDER  WHICH NEEDS TO BE COMPLETED AT       0530 AM  THEN NOTHING BY MOUTH  .   If your bowels are filled with gas, your surgeon will have difficulty visualizing your pelvic organs which increases your surgical risks.   CLEAR LIQUID DIET                                                                    water Black Coffee and tea, regular and decaf No Creamer                            Plain Jell-O any favor except red or purple                                  Fruit ices (not with fruit pulp)                                      Iced Popsicles                                                                     Cranberry, grape and apple juices Sports drinks like Gatorade Lightly seasoned clear broth or consume(fat free) Sugar, honey syrup    __________________________________________________________________   BRUSH YOUR TEETH MORNING OF SURGERY AND RINSE YOUR MOUTH OUT, NO CHEWING GUM CANDY OR MINTS.     Take these medicines the morning of surgery with A SIP OF WATER: levothyroxine  DO NOT TAKE ANY DIABETIC MEDICATIONS DAY OF YOUR SURGERY                               You may not have any metal on your body including hair pins and              piercings   Do not wear jewelry, make-up, lotions, powders,perfumes,  OR     deodorant             Do not wear nail polish on your fingernails or toenails .  Do not shave  48 hours prior to surgery.               Do not bring valuables to the hospital. [place].  Contacts, dentures or bridgework may not be worn into surgery.  You may bring a small overnight bag with you     Patients discharged the day of surgery will not be allowed to drive home. IF YOU ARE HAVING SURGERY AND GOING HOME THE SAME DAY, YOU MUST HAVE AN ADULT TO DRIVE YOU HOME AND BE WITH YOU FOR 24 HOURS. YOU MAY GO HOME BY TAXI OR UBER OR ORTHERWISE, BUT AN ADULT MUST ACCOMPANY YOU HOME AND STAY WITH YOU FOR 24 HOURS.  Name and phone number of your driver:  Special Instructions: N/A              Please read over the following fact sheets you were given: _____________________________________________________________________             [facility] - Preparing for Surgery Before surgery, you can play an important role.  Because skin is not sterile, your skin needs to be as free of germs as possible.  You can reduce the number of germs on your skin by washing with CHG (chlorahexidine gluconate) soap before surgery.  CHG is an antiseptic cleaner which kills germs and bonds with the skin to continue killing germs even after washing. Please DO NOT use if you have an allergy to CHG or antibacterial soaps.  If your skin becomes reddened/irritated stop using the CHG and inform your nurse when you arrive at Short Stay. Do not shave (including legs and underarms) for at least 48 hours prior to the first CHG shower.  You may shave your face/neck. Please follow these instructions carefully:  1.  Shower with CHG Soap the night before surgery and the  morning of Surgery.  2.  If you choose to wash your hair, wash your hair first as usual with your  normal  shampoo.  3.  After you shampoo, rinse your  hair and body thoroughly to remove the  shampoo.                           4.  Use CHG as you would any other liquid soap.  You can apply chg directly  to the skin and wash                       Gently with a scrungie or clean washcloth.  5.  Apply the CHG Soap to your body ONLY FROM THE NECK DOWN.   Do not use on face/ open                           Wound or open sores. Avoid contact with eyes, ears mouth and genitals (private parts).                       Wash face,  Genitals (private parts) with your normal soap.             6.  Wash thoroughly, paying  special attention to the area where your surgery  will be performed.  7.  Thoroughly rinse your body with warm water from the neck down.  8.  DO NOT shower/wash with your normal soap after using and rinsing off  the CHG Soap.                9.  Pat yourself dry with a clean towel.            10.  Wear clean pajamas.            11.  Place clean sheets on your bed the night of your first shower and do not  sleep with pets. Day of Surgery : Do not apply any lotions/deodorants the morning of surgery.  Please wear clean clothes to the hospital/surgery center.  FAILURE TO FOLLOW THESE INSTRUCTIONS MAY RESULT IN THE CANCELLATION OF YOUR SURGERY PATIENT SIGNATURE_________________________________  NURSE SIGNATURE__________________________________  ________________________________________________________________________

## 2021-05-02 NOTE — Progress Notes (Signed)
PCP -  Cardiologist -   PPM/ICD -  Device Orders -  Rep Notified -   Chest x-ray -  EKG - 11-01-20 epic Stress Test -  ECHO -  Cardiac Cath -  HgbA1c 03-30-21 epic  Sleep Study -  CPAP -   Fasting Blood Sugar -  Checks Blood Sugar _____ times a day  Blood Thinner Instructions: Aspirin Instructions:  ERAS Protcol - PRE-SURGERY Ensure or G2-   COVID TEST- N/A COVID vaccine -  Activity-- Anesthesia review: HTN, DM  Patient denies shortness of breath, fever, cough and chest pain at PAT appointment   All instructions explained to the patient, with a verbal understanding of the material. Patient agrees to go over the instructions while at home for a better understanding. Patient also instructed to self quarantine after being tested for COVID-19. The opportunity to ask questions was provided.

## 2021-05-04 ENCOUNTER — Encounter (HOSPITAL_BASED_OUTPATIENT_CLINIC_OR_DEPARTMENT_OTHER): Payer: Self-pay | Admitting: Physical Therapy

## 2021-05-04 NOTE — Progress Notes (Addendum)
Patient here for a pre-operative appointment prior to her scheduled surgery on May 18, 2021. She is scheduled for robotic assisted total laparoscopic hysterectomy, bilateral salpingectomy, possible laparotomy.  She has her pre-admission testing appointment on 05/06/21 at [facility] along with follow up with Dr. Lorenso Courier with hematology. The surgery was discussed in detail.  See after visit summary for additional details. Visual aids used to discuss items related to surgery including sequential compression stockings, foley catheter, IV pump, multi-modal pain regimen including tylenol, photo of the surgical robot, female reproductive system to discuss surgery in detail.      Discussed post-op pain management in detail including the aspects of the enhanced recovery pathway.  Advised her that a new prescription would be sent in for tramadol and it is only to be used for after her upcoming surgery.  We discussed the use of tylenol post-op and to monitor for a maximum of 4,000 mg in a 24 hour period.  Also prescribed sennakot to be used after surgery and to hold if having loose stools.  Discussed bowel regimen in detail.     Discussed the use of heparin pre-op, SCDs, and measures to take at home to prevent DVT including frequent mobility.  Reportable signs and symptoms of DVT discussed. Post-operative instructions discussed and expectations for after surgery. Incisional care discussed as well including reportable signs and symptoms including erythema, drainage, wound separation.     10 minutes spent with the patient.  Verbalizing understanding of material discussed. No needs or concerns voiced at the end of the visit.   Advised patient to call for any needs.  Advised that her post-operative medications had been prescribed and could be picked up at any time. Provera was also reordered for her to resume. Upcoming appointments also reviewed. She reports having intermittent tremors and feelings that her thyroid may be  off. Will add TSH on to lab work tomorrow (last in Dec 2022 with level at 0.580).  This appointment is included in the global surgical bundle as pre-operative teaching and has no charge.

## 2021-05-04 NOTE — Patient Instructions (Signed)
We have refilled your provera so you can resume taking that now.  Plan to follow up with Dr. Lorenso Courier tomorrow at the [facility] along with your preop appointment at the hospital.   Plan to have the uterine artery embolization prior to surgery with Interventional Radiology.   Preparing for your Surgery  Plan for surgery on May 18, 2021 with Dr. Jeral Pinch at [place] will be scheduled for robotic assisted total laparoscopic hysterectomy (removal of the uterus and cervix), bilateral salpingectomy (removal of both fallopian tubes), possible laparotomy (larger incision on your abdomen if needed).   Pre-operative Testing -You will receive a phone call from presurgical testing at [facility] to arrange for a pre-operative appointment and lab work.  -Bring your insurance card, copy of an advanced directive if applicable, medication list  -At that visit, you will be asked to sign a consent for a possible blood transfusion in case a transfusion becomes necessary during surgery.  The need for a blood transfusion is rare but having consent is a necessary part of your care.     -You should not be taking blood thinners or aspirin at least ten days prior to surgery unless instructed by your surgeon.  -Do not take supplements such as fish oil (omega 3), red yeast rice, turmeric before your surgery. You want to avoid medications with aspirin in them including headache powders such as BC or Goody's), Excedrin migraine.  Day Before Surgery at [place] will be asked to take in a light diet the day before surgery. You will be advised you can have clear liquids up until 3 hours before your surgery.    Eat a light diet the day before surgery.  Examples including soups, broths, toast, yogurt, mashed potatoes.  AVOID GAS PRODUCING FOODS. Things to avoid include carbonated beverages (fizzy beverages, sodas), raw fruits and raw vegetables (uncooked), or beans.   If your bowels are  filled with gas, your surgeon will have difficulty visualizing your pelvic organs which increases your surgical risks.  Your role in recovery Your role is to become active as soon as directed by your doctor, while still giving yourself time to heal.  Rest when you feel tired. You will be asked to do the following in order to speed your recovery:  - Cough and breathe deeply. This helps to clear and expand your lungs and can prevent pneumonia after surgery.  - [place]. Do mild physical activity. Walking or moving your legs help your circulation and body functions return to normal. Do not try to get up or walk alone the first time after surgery.   -If you develop swelling on one leg or the other, pain in the back of your leg, redness/warmth in one of your legs, please call the office or go to the Emergency Room to have a doppler to rule out a blood clot. For shortness of breath, chest pain-seek care in the Emergency Room as soon as possible. - Actively manage your pain. Managing your pain lets you move in comfort. We will ask you to rate your pain on a scale of zero to 10. It is your responsibility to tell your doctor or nurse where and how much you hurt so your pain can be treated.  Special Considerations -If you are diabetic, you may be placed on insulin after surgery to have closer control over your blood sugars to promote healing and recovery.  This does not mean that you  will be discharged on insulin.  If applicable, your oral antidiabetics will be resumed when you are tolerating a solid diet.  -Your final pathology results from surgery should be available around one week after surgery and the results will be relayed to you when available.  -Dr. Lahoma Crocker is the surgeon that assists your GYN Oncologist with surgery.  If you end up staying the night, the next day after your surgery you will either see Dr. Berline Lopes or Dr. Lahoma Crocker.  -FMLA forms can be faxed to  [phone] and please allow 5-7 business days for completion.  Pain Management After Surgery -You have been prescribed your pain medication (tramadol) and bowel regimen medications before surgery so that you can have these available when you are discharged from the hospital. The pain medication is for use ONLY AFTER surgery and a new prescription will not be given.   -Make sure that you have Tylenol and Ibuprofen (200-400 mg every 6-8 hours) at home to use on a regular basis after surgery for pain control. We recommend alternating the medications every hour to six hours since they work differently and are processed in the body differently for pain relief.  -Review the attached handout on narcotic use and their risks and side effects.   Bowel Regimen -You have been prescribed Sennakot-S to take nightly to prevent constipation especially if you are taking the narcotic pain medication intermittently.  It is important to prevent constipation and drink adequate amounts of liquids. You can stop taking this medication when you are not taking pain medication and you are back on your normal bowel routine.  Risks of Surgery Risks of surgery are low but include bleeding, infection, damage to surrounding structures, re-operation, blood clots, and very rarely death.   Blood Transfusion Information (For the consent to be signed before surgery)  We will be checking your blood type before surgery so in case of emergencies, we will know what type of blood you would need.                                            WHAT IS A BLOOD TRANSFUSION?  A transfusion is the replacement of blood or some of its parts. Blood is made up of multiple cells which provide different functions. Red blood cells carry oxygen and are used for blood loss replacement. White blood cells fight against infection. Platelets control bleeding. Plasma helps clot blood. Other blood products are available for specialized needs, such as  hemophilia or other clotting disorders. BEFORE THE TRANSFUSION  Who gives blood for transfusions?  You may be able to donate blood to be used at a later date on yourself (autologous donation). Relatives can be asked to donate blood. This is generally not any safer than if you have received blood from a stranger. The same precautions are taken to ensure safety when a relative's blood is donated. Healthy volunteers who are fully evaluated to make sure their blood is safe. This is blood bank blood. Transfusion therapy is the safest it has ever been in the practice of medicine. Before blood is taken from a donor, a complete history is taken to make sure that person has no history of diseases nor engages in risky social behavior (examples are intravenous drug use or sexual activity with multiple partners). The donor's travel history is screened to minimize risk of transmitting infections, such  as malaria. The donated blood is tested for signs of infectious diseases, such as HIV and hepatitis. The blood is then tested to be sure it is compatible with you in order to minimize the chance of a transfusion reaction. If you or a relative donates blood, this is often done in anticipation of surgery and is not appropriate for emergency situations. It takes many days to process the donated blood. RISKS AND COMPLICATIONS Although transfusion therapy is very safe and saves many lives, the main dangers of transfusion include:  Getting an infectious disease. Developing a transfusion reaction. This is an allergic reaction to something in the blood you were given. Every precaution is taken to prevent this. The decision to have a blood transfusion has been considered carefully by your caregiver before blood is given. Blood is not given unless the benefits outweigh the risks.  AFTER SURGERY INSTRUCTIONS  Return to work: 4-6 weeks if applicable  Activity: 1. Be up and out of the bed during the day.  Take a nap if needed.   You may walk up steps but be careful and use the hand rail.  Stair climbing will tire you more than you think, you may need to stop part way and rest.   2. No lifting or straining for 6 weeks over 10 pounds. No pushing, pulling, straining for 6 weeks.  3. No driving for around 1 week(s).  Do not drive if you are taking narcotic pain medicine and make sure that your reaction time has returned.   4. You can shower as soon as the next day after surgery. Shower daily.  Use your regular soap and water (not directly on the incision) and pat your incision(s) dry afterwards; don't rub.  No tub baths or submerging your body in water until cleared by your surgeon. If you have the soap that was given to you by pre-surgical testing that was used before surgery, you do not need to use it afterwards because this can irritate your incisions.   5. No sexual activity and nothing in the vagina for 8 weeks.  6. You may experience a small amount of clear drainage from your incisions, which is normal.  If the drainage persists, increases, or changes color please call the office.  7. Do not use creams, lotions, or ointments such as neosporin on your incisions after surgery until advised by your surgeon because they can cause removal of the dermabond glue on your incisions.    8. You may experience vaginal spotting after surgery or around the 6-8 week mark from surgery when the stitches at the top of the vagina begin to dissolve.  The spotting is normal but if you experience heavy bleeding, call our office.  9. Take Tylenol or ibuprofen (use sparingly due to elevated BP) first for pain and only use narcotic pain medication for severe pain not relieved by the Tylenol or Ibuprofen.  Monitor your Tylenol intake to a max of 4,000 mg in a 24 hour period. You can alternate these medications after surgery.  Diet: 1. Low sodium Heart Healthy Diet is recommended but you are cleared to resume your normal (before surgery) diet  after your procedure.  2. It is safe to use a laxative, such as Miralax or Colace, if you have difficulty moving your bowels. You have been prescribed Sennakot-S to take at bedtime every evening after surgery to keep bowel movements regular and to prevent constipation.    Wound Care: 1. Keep clean and dry.  Shower  daily.  Reasons to call the Doctor: Fever - Oral temperature greater than 100.4 degrees Fahrenheit Foul-smelling vaginal discharge Difficulty urinating Nausea and vomiting Increased pain at the site of the incision that is unrelieved with pain medicine. Difficulty breathing with or without chest pain New calf pain especially if only on one side Sudden, continuing increased vaginal bleeding with or without clots.   Contacts: For questions or concerns you should contact:  Dr. Jeral Pinch at [phone]  Joylene John, NP at [phone]  After Hours: call [phone] and have the GYN Oncologist paged/contacted (after 5 pm or on the weekends).  Messages sent via mychart are for non-urgent matters and are not responded to after hours so for urgent needs, please call the after hours number.

## 2021-05-05 ENCOUNTER — Inpatient Hospital Stay: Payer: Managed Care, Other (non HMO) | Attending: Gynecologic Oncology | Admitting: Gynecologic Oncology

## 2021-05-05 ENCOUNTER — Encounter: Payer: Self-pay | Admitting: Gynecologic Oncology

## 2021-05-05 ENCOUNTER — Other Ambulatory Visit (HOSPITAL_COMMUNITY): Payer: Self-pay | Admitting: Physician Assistant

## 2021-05-05 ENCOUNTER — Telehealth: Payer: Self-pay

## 2021-05-05 ENCOUNTER — Other Ambulatory Visit: Payer: Self-pay

## 2021-05-05 VITALS — BP 130/68 | HR 92 | Temp 99.5°F | Resp 16 | Ht 66.0 in | Wt 178.8 lb

## 2021-05-05 DIAGNOSIS — D500 Iron deficiency anemia secondary to blood loss (chronic): Secondary | ICD-10-CM | POA: Insufficient documentation

## 2021-05-05 DIAGNOSIS — N858 Other specified noninflammatory disorders of uterus: Secondary | ICD-10-CM

## 2021-05-05 DIAGNOSIS — Z8639 Personal history of other endocrine, nutritional and metabolic disease: Secondary | ICD-10-CM

## 2021-05-05 DIAGNOSIS — N939 Abnormal uterine and vaginal bleeding, unspecified: Secondary | ICD-10-CM

## 2021-05-05 DIAGNOSIS — D259 Leiomyoma of uterus, unspecified: Secondary | ICD-10-CM

## 2021-05-05 DIAGNOSIS — N920 Excessive and frequent menstruation with regular cycle: Secondary | ICD-10-CM | POA: Insufficient documentation

## 2021-05-05 MED ORDER — TRAMADOL HCL 50 MG PO TABS: 50.0000 mg | ORAL_TABLET | Freq: Four times a day (QID) | ORAL | 0 refills | Status: DC | PRN

## 2021-05-05 MED ORDER — SENNOSIDES-DOCUSATE SODIUM 8.6-50 MG PO TABS: 2.0000 | ORAL_TABLET | Freq: Every day | ORAL | 0 refills | Status: DC

## 2021-05-05 MED ORDER — MEDROXYPROGESTERONE ACETATE 10 MG PO TABS: 10.0000 mg | ORAL_TABLET | Freq: Every day | ORAL | 0 refills | Status: DC

## 2021-05-05 NOTE — Addendum Note (Signed)
Addended by: Joylene John D on: 05/05/2021 12:58 PM   Modules accepted: Orders

## 2021-05-05 NOTE — Progress Notes (Addendum)
Anesthesia Review:  PCP: Durene Fruits, NP  Cardiologist : Wore heart monitor in 2016 for 3 days  Chest x-ray : EKG : 11/01/20  Echo : Stress test: Cardiac Cath :  Activity level:  can do a flight of stairs without difficulty  Sleep Study/ CPAP : none  Fasting Blood Sugar :      / Checks Blood Sugar -- times a day:   Blood Thinner/ Instructions /Last Dose: ASA / Instructions/ Last Dose :   DM- type 2  Hgba1c- -7.2 on 03/30/21  No covid test ambulatory surgeyr  Checks glucose once daily  Having IR procedure on 04/2021 and surgery on 05/18/21.  Separate instructions were given for both procedures.  Hbiclens was given for each procedure  CBC. TSH and hgba1c ( done 03/30/21) routed to Joylene John, NP .  CBC done 05/06/21  and hgba1c- done 03/30/21 routed to Dr Berline Lopes.  Short STay, Sharyn Lull was notified on 05/06/21 that preprocedure instructions have been given to pt at time of preop appt.  Copy of preprocedure instructions given to Short STay.

## 2021-05-05 NOTE — Progress Notes (Unsigned)
GYN Oncology  Spoke with Pasty Spillers PA with IR via chat about recent lab work orders placed for procedure on Monday, Feb 20. Patient is coming for hematology follow up on 2/17 along with preop testing with labs to be drawn at that time. To prevent duplication of labs and multiple lab draws, the plan will be for all labs to be drawn at preop appt at 8 am tomorrow. Patient is aware. Hayley B PA is aware that her duplicate orders have been discontinued and all orders needed for IR will be drawn at preop tomorrow. Preop is aware of the plan as well.

## 2021-05-05 NOTE — Progress Notes (Signed)
Brief Progress Note:  Pt having labs with Dr. Lorenso Courier tomorrow 05/06/21.  Labs needed for UFE with Dr. Dwaine Gale taking place on Monday 05/09/21 will be drawn during tomorrow's visit per conversation with Joylene John, NP and do not need to be repeated in short stay.     Electronically Signed: Pasty Spillers, PA-C 05/05/2021, 4:24 PM

## 2021-05-05 NOTE — Telephone Encounter (Signed)
Spoke with Crystal Vasquez this afternoon regarding her appointments tomorrow. Pre-admissions appointment has been rescheduled to 8 am. Pre-op labs, Dr. Libby Maw labs and IR labs will be drawn at that time to avoid multiple sticks for the patient. Patient is agreeable to new appointment time.

## 2021-05-05 NOTE — Progress Notes (Addendum)
I    Your procedure is scheduled on:  05/18/21    t to [place]  Entrance   Report to admitting at   [place]     Call this number if you have problems the morning of surgery [phone]    REMEMBER: NO  SOLID FOOD CANDY OR GUM AFTER MIDNIGHT. CLEAR LIQUIDS UNTIL    0530am      . NOTHING BY MOUTH EXCEPT CLEAR LIQUIDS UNTIL   0530am  . PLEASE FINISH G 2 Lower sugar DRINK PER SURGEON ORDER  WHICH NEEDS TO BE COMPLETED AT     0530am  .  Eat a light diet the day before surgery.   Avoid gas producing foods.      CLEAR LIQUID DIET   Foods Allowed                                                                    Coffee and tea, regular and decaf                            Fruit ices (not with fruit pulp)                                      Iced Popsicles                                    Carbonated beverages, regular and diet                                    Cranberry, grape and apple juices Sports drinks like Gatorade Lightly seasoned clear broth or consume(fat free) Sugar, honey syrup ___________________________________________________________________      BRUSH YOUR TEETH MORNING OF SURGERY AND RINSE YOUR MOUTH OUT, NO CHEWING GUM CANDY OR MINTS.     Take these medicines the morning of surgery with A SIP OF WATER:  Amlodiopine, toprol, nasal spray omeprazole   DO NOT TAKE ANY DIABETIC MEDICATIONS DAY OF YOUR SURGERY                               You may not have any metal on your body including hair pins and              piercings  Do not wear jewelry, make-up, lotions, powders or perfumes, deodorant             Do not wear nail polish on your fingernails.  Do not shave  48 hours prior to surgery.              Men may shave face and neck.   Do not bring valuables to the hospital. [place].  Contacts, dentures or bridgework may not be worn into surgery.  Leave suitcase in the car. After surgery it may  be brought to your room.     Patients discharged the day of surgery will not be allowed to drive home. IF YOU ARE HAVING SURGERY AND GOING HOME THE SAME DAY, YOU MUST HAVE AN ADULT TO DRIVE YOU HOME AND BE WITH YOU FOR 24 HOURS. YOU MAY GO HOME BY TAXI OR UBER OR ORTHERWISE, BUT AN ADULT MUST ACCOMPANY YOU HOME AND STAY WITH YOU FOR 24 HOURS.  Name and phone number of your driver:  Special Instructions: N/A              Please read over the following fact sheets you were given: _____________________________________________________________________  [facility] - Preparing for Surgery Before surgery, you can play an important role.  Because skin is not sterile, your skin needs to be as free of germs as possible.  You can reduce the number of germs on your skin by washing with CHG (chlorahexidine gluconate) soap before surgery.  CHG is an antiseptic cleaner which kills germs and bonds with the skin to continue killing germs even after washing. Please DO NOT use if you have an allergy to CHG or antibacterial soaps.  If your skin becomes reddened/irritated stop using the CHG and inform your nurse when you arrive at Short Stay. Do not shave (including legs and underarms) for at least 48 hours prior to the first CHG shower.  You may shave your face/neck. Please follow these instructions carefully:  1.  Shower with CHG Soap the night before surgery and the  morning of Surgery.  2.  If you choose to wash your hair, wash your hair first as usual with your  normal  shampoo.  3.  After you shampoo, rinse your hair and body thoroughly to remove the  shampoo.                           4.  Use CHG as you would any other liquid soap.  You can apply chg directly  to the skin and wash                       Gently with a scrungie or clean washcloth.  5.  Apply the CHG Soap to your body ONLY FROM THE NECK DOWN.   Do not use on face/ open                           Wound or open sores. Avoid contact with eyes, ears  mouth and genitals (private parts).                       Wash face,  Genitals (private parts) with your normal soap.             6.  Wash thoroughly, paying special attention to the area where your surgery  will be performed.  7.  Thoroughly rinse your body with warm water from the neck down.  8.  DO NOT shower/wash with your normal soap after using and rinsing off  the CHG Soap.                9.  Pat yourself dry with a clean towel.            10.  Wear clean pajamas.            11.  Place clean sheets on your bed the night of your  first shower and do not  sleep with pets. Day of Surgery : Do not apply any lotions/deodorants the morning of surgery.  Please wear clean clothes to the hospital/surgery center.  FAILURE TO FOLLOW THESE INSTRUCTIONS MAY RESULT IN THE CANCELLATION OF YOUR SURGERY PATIENT SIGNATURE_________________________________  NURSE SIGNATURE__________________________________  ________________________________________________________________________

## 2021-05-06 ENCOUNTER — Other Ambulatory Visit: Payer: Self-pay | Admitting: Student

## 2021-05-06 ENCOUNTER — Inpatient Hospital Stay (HOSPITAL_BASED_OUTPATIENT_CLINIC_OR_DEPARTMENT_OTHER): Payer: Managed Care, Other (non HMO) | Admitting: Hematology and Oncology

## 2021-05-06 ENCOUNTER — Telehealth: Payer: Self-pay

## 2021-05-06 ENCOUNTER — Encounter (HOSPITAL_COMMUNITY): Payer: Self-pay

## 2021-05-06 ENCOUNTER — Other Ambulatory Visit: Payer: Self-pay

## 2021-05-06 ENCOUNTER — Other Ambulatory Visit: Payer: Self-pay | Admitting: Hematology and Oncology

## 2021-05-06 ENCOUNTER — Inpatient Hospital Stay: Payer: Managed Care, Other (non HMO)

## 2021-05-06 ENCOUNTER — Other Ambulatory Visit: Payer: Self-pay | Admitting: Radiology

## 2021-05-06 ENCOUNTER — Encounter (HOSPITAL_COMMUNITY)
Admission: RE | Admit: 2021-05-06 | Discharge: 2021-05-06 | Disposition: A | Discharge status: Home / Self Care | Payer: Managed Care, Other (non HMO) | Source: Ambulatory Visit | Attending: Gynecologic Oncology | Admitting: Gynecologic Oncology

## 2021-05-06 VITALS — BP 113/75 | HR 80 | Temp 97.0°F | Resp 19 | Ht 66.0 in | Wt 177.3 lb

## 2021-05-06 DIAGNOSIS — N858 Other specified noninflammatory disorders of uterus: Secondary | ICD-10-CM | POA: Insufficient documentation

## 2021-05-06 DIAGNOSIS — D500 Iron deficiency anemia secondary to blood loss (chronic): Secondary | ICD-10-CM

## 2021-05-06 DIAGNOSIS — Z01812 Encounter for preprocedural laboratory examination: Secondary | ICD-10-CM | POA: Insufficient documentation

## 2021-05-06 DIAGNOSIS — N939 Abnormal uterine and vaginal bleeding, unspecified: Secondary | ICD-10-CM | POA: Insufficient documentation

## 2021-05-06 HISTORY — DX: Headache, unspecified: R51.9

## 2021-05-06 HISTORY — DX: Depression, unspecified: F32.A

## 2021-05-06 HISTORY — DX: Cardiac murmur, unspecified: R01.1

## 2021-05-06 HISTORY — DX: Gastro-esophageal reflux disease without esophagitis: K21.9

## 2021-05-06 HISTORY — DX: Unspecified asthma, uncomplicated: J45.909

## 2021-05-06 HISTORY — DX: Anemia, unspecified: D64.9

## 2021-05-06 HISTORY — DX: Dyspnea, unspecified: R06.00

## 2021-05-06 LAB — COMPREHENSIVE METABOLIC PANEL
ALT: 15 U/L (ref 0–44)
AST: 13 U/L — ABNORMAL LOW (ref 15–41)
Albumin: 3.7 g/dL (ref 3.5–5.0)
Alkaline Phosphatase: 60 U/L (ref 38–126)
Anion gap: 10 (ref 5–15)
BUN: 11 mg/dL (ref 6–20)
CO2: 24 mmol/L (ref 22–32)
Calcium: 9.1 mg/dL (ref 8.9–10.3)
Chloride: 101 mmol/L (ref 98–111)
Creatinine, Ser: 0.54 mg/dL (ref 0.44–1.00)
GFR, Estimated: >60 mL/min (ref >60)
Glucose, Bld: 145 mg/dL — ABNORMAL HIGH (ref 70–99)
Potassium: 3.6 mmol/L (ref 3.5–5.1)
Sodium: 135 mmol/L (ref 135–145)
Total Bilirubin: <0.1 mg/dL — ABNORMAL LOW (ref 0.3–1.2)
Total Protein: 7.2 g/dL (ref 6.5–8.1)

## 2021-05-06 LAB — CBC WITH DIFFERENTIAL/PLATELET
Abs Immature Granulocytes: 0.06 10*3/uL (ref 0.00–0.07)
Basophils Absolute: 0.1 10*3/uL (ref 0.0–0.1)
Basophils Relative: 1 %
Eosinophils Absolute: 0 10*3/uL (ref 0.0–0.5)
Eosinophils Relative: 0 %
HCT: 29.7 % — ABNORMAL LOW (ref 36.0–46.0)
Hemoglobin: 9.3 g/dL — ABNORMAL LOW (ref 12.0–15.0)
Immature Granulocytes: 1 %
Lymphocytes Relative: 15 %
Lymphs Abs: 1.7 10*3/uL (ref 0.7–4.0)
MCH: 29.2 pg (ref 26.0–34.0)
MCHC: 31.3 g/dL (ref 30.0–36.0)
MCV: 93.1 fL (ref 80.0–100.0)
Monocytes Absolute: 0.8 10*3/uL (ref 0.1–1.0)
Monocytes Relative: 7 %
Neutro Abs: 8.5 10*3/uL — ABNORMAL HIGH (ref 1.7–7.7)
Neutrophils Relative %: 76 %
Platelets: 437 10*3/uL — ABNORMAL HIGH (ref 150–400)
RBC: 3.19 MIL/uL — ABNORMAL LOW (ref 3.87–5.11)
RDW: 20 % — ABNORMAL HIGH (ref 11.5–15.5)
WBC: 11.1 10*3/uL — ABNORMAL HIGH (ref 4.0–10.5)
nRBC: 0 % (ref 0.0–0.2)

## 2021-05-06 LAB — PROTIME-INR
INR: 0.9 (ref 0.8–1.2)
Prothrombin Time: 12.1 seconds (ref 11.4–15.2)

## 2021-05-06 LAB — TYPE AND SCREEN
ABO/RH(D): A POS
Antibody Screen: NEGATIVE

## 2021-05-06 LAB — IRON AND TIBC
Iron: 26 ug/dL — ABNORMAL LOW (ref 28–170)
Saturation Ratios: 9 % — ABNORMAL LOW (ref 10.4–31.8)
TIBC: 287 ug/dL (ref 250–450)
UIBC: 261 ug/dL

## 2021-05-06 LAB — GLUCOSE, CAPILLARY: Glucose-Capillary: 136 mg/dL — ABNORMAL HIGH (ref 70–99)

## 2021-05-06 LAB — TSH: TSH: 0.313 u[IU]/mL — ABNORMAL LOW (ref 0.350–4.500)

## 2021-05-06 LAB — HCG, SERUM, QUALITATIVE: Preg, Serum: NEGATIVE

## 2021-05-06 LAB — FERRITIN: Ferritin: 70 ng/mL (ref 11–307)

## 2021-05-06 NOTE — Telephone Encounter (Signed)
Attempted to contact patient to review recent lab work from 05/06/21. Unable to contact patient. Left message requesting return call.

## 2021-05-06 NOTE — Psychosocial Assessment (Signed)
Called and LVMMM for [place] in IR at [facility]  On message left that preprcedure instructions had been given to pt based on instsructions in epic LEFt call back number of [phone]

## 2021-05-06 NOTE — Progress Notes (Signed)
Gianny A Banke  05/06/2021   Your procedure is scheduled on:      05/09/21.  Covid test upon arrival   Report to [place]  Entrance   Report to admitting at   0700 AM     Call this number if you have problems the morning of surgery [phone]    Remember: Do not eat food , candy gum or mints :After Midnight.                                   _____________________________________________________________________    BRUSH YOUR TEETH MORNING OF SURGERY AND RINSE YOUR MOUTH OUT, NO CHEWING GUM CANDY OR MINTS.     Take these medicines the morning of surgery with A SIP OF WATER:  Amlodipine, toprol, omeprazole, nasal spray  DO NOT TAKE ANY DIABETIC MEDICATIONS DAY OF YOUR SURGERY                               You may not have any metal on your body including hair pins and              piercings  Do not wear jewelry, make-up, lotions, powders or perfumes, deodorant             Do not wear nail polish on your fingernails.  Do not shave  48 hours prior to surgery.              Men may shave face and neck.   Do not bring valuables to the hospital. [place].  Contacts, dentures or bridgework may not be worn into surgery.  Leave suitcase in the car. After surgery it may be brought to your room.     Patients discharged the day of surgery will not be allowed to drive home. IF YOU ARE HAVING SURGERY AND GOING HOME THE SAME DAY, YOU MUST HAVE AN ADULT TO DRIVE YOU HOME AND BE WITH YOU FOR 24 HOURS. YOU MAY GO HOME BY TAXI OR UBER OR ORTHERWISE, BUT AN ADULT MUST ACCOMPANY YOU HOME AND STAY WITH YOU FOR 24 HOURS.  Name and phone number of your driver:  Special Instructions: N/A              Please read over the following fact sheets you were given: _____________________________________________________________________  [facility] - Preparing for Surgery Before surgery, you can play an important role.   Because skin is not sterile, your skin needs to be as free of germs as possible.  You can reduce the number of germs on your skin by washing with CHG (chlorahexidine gluconate) soap before surgery.  CHG is an antiseptic cleaner which kills germs and bonds with the skin to continue killing germs even after washing. Please DO NOT use if you have an allergy to CHG or antibacterial soaps.  If your skin becomes reddened/irritated stop using the CHG and inform your nurse when you arrive at Short Stay. Do not shave (including legs and underarms) for at least 48 hours prior to the first CHG shower.  You may shave your face/neck. Please follow these instructions carefully:  1.  Shower with CHG Soap the night before  surgery and the  morning of Surgery.  2.  If you choose to wash your hair, wash your hair first as usual with your  normal  shampoo.  3.  After you shampoo, rinse your hair and body thoroughly to remove the  shampoo.                           4.  Use CHG as you would any other liquid soap.  You can apply chg directly  to the skin and wash                       Gently with a scrungie or clean washcloth.  5.  Apply the CHG Soap to your body ONLY FROM THE NECK DOWN.   Do not use on face/ open                           Wound or open sores. Avoid contact with eyes, ears mouth and genitals (private parts).                       Wash face,  Genitals (private parts) with your normal soap.             6.  Wash thoroughly, paying special attention to the area where your surgery  will be performed.  7.  Thoroughly rinse your body with warm water from the neck down.  8.  DO NOT shower/wash with your normal soap after using and rinsing off  the CHG Soap.                9.  Pat yourself dry with a clean towel.            10.  Wear clean pajamas.            11.  Place clean sheets on your bed the night of your first shower and do not  sleep with pets. Day of Surgery : Do not apply any lotions/deodorants the  morning of surgery.  Please wear clean clothes to the hospital/surgery center.  FAILURE TO FOLLOW THESE INSTRUCTIONS MAY RESULT IN THE CANCELLATION OF YOUR SURGERY PATIENT SIGNATURE_________________________________  NURSE SIGNATURE__________________________________  ________________________________________________________________________

## 2021-05-06 NOTE — Progress Notes (Addendum)
Attempted x 4 to call pharmacy at [phone] and fast busy was heard all times.  PT given number to call of [phone] and pt instructed to call pharmacy today in regards to medications.  PT voiced understanding.   Nurse even call the main pharmacy number and was told to call the [phone] even though I told them a fast busy was heard.

## 2021-05-06 NOTE — Progress Notes (Signed)
[facility] [phone]   [phone]  PROGRESS NOTE  Patient Care Team: Camillia Herter, NP as PCP - General (Nurse Practitioner)  Hematological/Oncological History # Iron Deficiency Anemia 2/2 to GYN Bleeding 10/20/2020: WBC 4.0, Hgb 12.0, MCV 86.4, Plt 250 03/25/2021: WBC 9.4, Hgb 9.7, MCV 86.3, Plt 430 03/29/2021: establish care with Dr. Lorenso Courier  04/04/2021-04/11/2021: IV feraheme 510 mg x 2 doses   Interval History:  Crystal Vasquez 51 y.o. female with medical history significant for iron deficiency anemia secondary to heavy menstrual bleeding who presents for a follow up visit. The patient's last visit was on 03/29/2021 at which time she established care. In the interim since the last visit she received 2 doses of IV Feraheme on 1/16 and 04/11/2021.  On exam today Crystal Vasquez reports that she does not notice any difference with the IV iron infusions.  She notes that she did start bleeding again and that she continues to have fatigue.  She is sleeping "a lot".  She notes that the menstrual cycles are passing moderate blood clots and that she does have an upcoming surgery with Dr. Berline Lopes on 05/18/2021.  She notes that she is not having any trouble with the iron pills that she is taking and that she is not having any upset stomach or constipation.  She notes that her stool is "all kinds of color".  Including yellowish and brown.  She notes that she does endorse having shortness of breath and sometimes has some dizziness.  She currently denies any chest pain.  She reports no fevers, chills, sweats, nausea, ming or diarrhea.  A full 10 point ROS is listed below.  MEDICAL HISTORY:  Past Medical History:  Diagnosis Date   Anemia    Asthma    due to acid reflux   Depression    DM2    Dyspnea    GERD (gastroesophageal reflux disease)    Headache    Heart murmur    benign   Hypertension    Obesity (BMI 30-39.9)    Thyroid disease     SURGICAL  HISTORY: Past Surgical History:  Procedure Laterality Date   BIOPSY THYROID     IR ANGIOGRAM PELVIS SELECTIVE OR SUPRASELECTIVE  05/09/2021   IR ANGIOGRAM SELECTIVE EACH ADDITIONAL VESSEL  05/09/2021   IR AORTAGRAM ABDOMINAL SERIALOGRAM  05/09/2021   IR EMBO TUMOR ORGAN ISCHEMIA INFARCT INC GUIDE ROADMAPPING  05/09/2021   IR RADIOLOGIST EVAL & MGMT  04/11/2021   IR US GUIDE VASC ACCESS LEFT  05/09/2021    SOCIAL HISTORY: Social History   Socioeconomic History   Marital status: Single    Spouse name: Not on file   Number of children: Not on file   Years of education: Not on file   Highest education level: Not on file  Occupational History   Not on file  Tobacco Use   Smoking status: Never   Smokeless tobacco: Never  Vaping Use   Vaping Use: Never used  Substance and Sexual Activity   Alcohol use: Not Currently   Drug use: Never   Sexual activity: Not Currently  Other Topics Concern   Not on file  Social History Narrative   Not on file   Social Determinants of Health   Financial Resource Strain: Not on file  Food Insecurity: Food Insecurity Present   Worried About [place] in the Last Year: Often true   Ran Out of Food in the Last Year: Sometimes true  Transportation Needs: Public librarian (Medical): Yes   Lack of Transportation (Non-Medical): Yes  Physical Activity: Not on file  Stress: Not on file  Social Connections: Not on file  Intimate Partner Violence: Not on file    FAMILY HISTORY: Family History  Problem Relation Age of Onset   Endometrial cancer Maternal Aunt    Colon cancer Neg Hx    Breast cancer Neg Hx    Ovarian cancer Neg Hx    Pancreatic cancer Neg Hx    Prostate cancer Neg Hx     ALLERGIES:  has No Known Allergies.  MEDICATIONS:  No current facility-administered medications for this visit.   Current Outpatient Medications  Medication Sig Dispense Refill   docusate sodium (COLACE) 100 MG  capsule Take 1 capsule (100 mg total) by mouth 2 (two) times daily for 14 days. 28 capsule 0   ondansetron (ZOFRAN) 4 MG tablet Take 1 tablet (4 mg total) by mouth every 8 (eight) hours as needed for up to 7 days for nausea or vomiting. 20 tablet 0   oxyCODONE (ROXICODONE) 5 MG immediate release tablet Take 1 tablet (5 mg total) by mouth every 6 (six) hours as needed for up to 5 days for severe pain. 20 tablet 0   Facility-Administered Medications Ordered in Other Visits  Medication Dose Route Frequency Provider Last Rate Last Admin   0.9 %  sodium chloride infusion  250 mL Intravenous PRN Mir, Paula Libra, MD       amLODipine (NORVASC) tablet 10 mg  10 mg Oral Daily Mir, Paula Libra, MD   10 mg at 05/10/21 1012   docusate sodium (COLACE) capsule 100 mg  100 mg Oral BID Mir, Paula Libra, MD       ferrous sulfate tablet 325 mg  325 mg Oral q AM Mir, Paula Libra, MD   325 mg at 05/10/21 0748   fluticasone (FLONASE) 50 MCG/ACT nasal spray 1 spray  1 spray Each Nare Daily Mir, Paula Libra, MD   1 spray at 05/10/21 1016   glipiZIDE (GLUCOTROL) tablet 10 mg  10 mg Oral BID AC Mir, Paula Libra, MD   10 mg at 05/10/21 0748   hydrochlorothiazide (HYDRODIURIL) tablet 25 mg  25 mg Oral Daily Mir, Paula Libra, MD   25 mg at 05/10/21 1013   HYDROcodone-acetaminophen (NORCO/VICODIN) 5-325 MG per tablet 1-2 tablet  1-2 tablet Oral Q6H PRN Allred, Darrell K, PA-C       ketorolac (TORADOL) 30 MG/ML injection 30 mg  30 mg Intravenous Q6H Mir, Sharen Heck R, MD   30 mg at 05/10/21 1013   lisinopril (ZESTRIL) tablet 40 mg  40 mg Oral Daily Mir, Paula Libra, MD   40 mg at 05/10/21 1013   medroxyPROGESTERone (PROVERA) tablet 10 mg  10 mg Oral Daily Mir, Paula Libra, MD   10 mg at 05/10/21 1013   [START ON 05/11/2021] metFORMIN (GLUCOPHAGE) tablet 1,000 mg  1,000 mg Oral BID WC Mir, Paula Libra, MD       metoprolol succinate (TOPROL-XL) 24 hr tablet 25 mg  25 mg Oral Daily Mir, Sharen Heck R, MD   25 mg at 05/10/21 1012   ondansetron (ZOFRAN)  injection 4 mg  4 mg Intravenous Q6H PRN Mir, Paula Libra, MD   4 mg at 05/10/21 0748   pantoprazole (PROTONIX) EC tablet 40 mg  40 mg Oral Daily Mir, Paula Libra, MD   40 mg at 05/10/21 1013   promethazine (PHENERGAN)  tablet 25 mg  25 mg Oral Q8H PRN Mir, Paula Libra, MD   25 mg at 05/09/21 2242   Or   promethazine (PHENERGAN) suppository 25 mg  25 mg Rectal Q8H PRN Mir, Paula Libra, MD       senna-docusate (Senokot-S) tablet 2 tablet  2 tablet Oral QHS Mir, Paula Libra, MD       sodium chloride flush (NS) 0.9 % injection 3 mL  3 mL Intravenous Q12H Mir, Paula Libra, MD   3 mL at 05/09/21 2245   sodium chloride flush (NS) 0.9 % injection 3 mL  3 mL Intravenous PRN Mir, Paula Libra, MD        REVIEW OF SYSTEMS:   Constitutional: ( - ) fevers, ( - )  chills , ( - ) night sweats Eyes: ( - ) blurriness of vision, ( - ) double vision, ( - ) watery eyes Ears, nose, mouth, throat, and face: ( - ) mucositis, ( - ) sore throat Respiratory: ( - ) cough, ( - ) dyspnea, ( - ) wheezes Cardiovascular: ( - ) palpitation, ( - ) chest discomfort, ( - ) lower extremity swelling Gastrointestinal:  ( - ) nausea, ( - ) heartburn, ( - ) change in bowel habits Skin: ( - ) abnormal skin rashes Lymphatics: ( - ) new lymphadenopathy, ( - ) easy bruising Neurological: ( - ) numbness, ( - ) tingling, ( - ) new weaknesses Behavioral/Psych: ( - ) mood change, ( - ) new changes  All other systems were reviewed with the patient and are negative.  PHYSICAL EXAMINATION:  Vitals:   05/06/21 0914  BP: 113/75  Pulse: 80  Resp: 19  Temp: (!) 97 F (36.1 C)  SpO2: 100%   Filed Weights   05/06/21 0914  Weight: 177 lb 4.8 oz (80.4 kg)    GENERAL: Well-appearing middle-age Caucasian female, alert, no distress and comfortable SKIN: skin color, texture, turgor are normal, no rashes or significant lesions EYES: conjunctiva are pink and non-injected, sclera clear LUNGS: clear to auscultation and percussion with normal breathing  effort HEART: regular rate & rhythm and no murmurs and no lower extremity edema Musculoskeletal: no cyanosis of digits and no clubbing  PSYCH: alert & oriented x 3, fluent speech NEURO: no focal motor/sensory deficits  LABORATORY DATA:  I have reviewed the data as listed CBC Latest Ref Rng & Units 05/06/2021 03/29/2021 03/25/2021  WBC 4.0 - 10.5 K/uL 11.1(H) 8.0 9.4  Hemoglobin 12.0 - 15.0 g/dL 9.3(L) 10.5(L) 9.7(L)  Hematocrit 36.0 - 46.0 % 29.7(L) 34.5(L) 32.0(L)  Platelets 150 - 400 K/uL 437(H) 438(H) 430(H)    CMP Latest Ref Rng & Units 05/06/2021 03/29/2021 10/27/2020  Glucose 70 - 99 mg/dL 145(H) 91 358(H)  BUN 6 - 20 mg/dL 11 20 9   Creatinine 0.44 - 1.00 mg/dL 0.54 0.72 0.70  Sodium 135 - 145 mmol/L 135 139 136  Potassium 3.5 - 5.1 mmol/L 3.6 3.7 3.6  Chloride 98 - 111 mmol/L 101 102 99  CO2 22 - 32 mmol/L 24 27 25   Calcium 8.9 - 10.3 mg/dL 9.1 10.0 9.2  Total Protein 6.5 - 8.1 g/dL 7.2 7.6 7.7  Total Bilirubin 0.3 - 1.2 mg/dL <0.1(L) 0.2(L) 0.3  Alkaline Phos 38 - 126 U/L 60 48 63  AST 15 - 41 U/L 13(L) 11(L) 16  ALT 0 - 44 U/L 15 13 17     RADIOGRAPHIC STUDIES: I have personally reviewed the radiological images as listed and agreed with  the findings in the report. IR Aortagram Abdominal Serialogram  Result Date: 05/09/2021 INDICATION: 51 year old woman with prolapsing uterine fibroid/mass presents to IR for given artery embolization prior to hysterectomy. EXAM: 1. Ultrasound-guided access of left common femoral artery. 2. Aortogram. 3. Bilateral internal iliac arteriogram. 4. Right uterine arteriogram and embolization. 5. Angio-Seal closure of left common femoral artery access site. MEDICATIONS: Ancef 2 g IV. The antibiotic was administered within 1 hour of the procedure Toradol 30 mg IV Zofran 4 mg IV Nitroglycerin 200 mg intra arterial ANESTHESIA/SEDATION: Moderate (conscious) sedation was employed during this procedure. A total of Versed 2 mg and Fentanyl 100 mcg was  administered intravenously by the radiology nurse. Total intra-service moderate Sedation Time: 65 minutes. The patient's level of consciousness and vital signs were monitored continuously by radiology nursing throughout the procedure under my direct supervision. CONTRAST:  100 mL intra-arterial of Omnipaque 300 FLUOROSCOPY: Radiation Exposure Index (as provided by the fluoroscopic device): 235 mGy Kerma COMPLICATIONS: None immediate. PROCEDURE: Informed consent was obtained from the patient following explanation of the procedure, risks, benefits and alternatives. The patient understands, agrees and consents for the procedure. All questions were addressed. A time out was performed prior to the initiation of the procedure. Maximal barrier sterile technique utilized including caps, mask, sterile gowns, sterile gloves, large sterile drape, hand hygiene, and Betadine prep. Patient positioned supine on the procedure table. The left groin skin was prepped and draped in usual fashion. Ultrasound image documenting patency of the left common femoral artery was obtained and placed in permanent medical record. Sterile ultrasound probe cover and gel utilized throughout the procedure. Utilizing CT guidance, left common femoral artery was accessed at the level of the femoral head using a 21 gauge needle. 21 gauge needle exchanged for transitional dilator set over 0.018 inch guidewire. Transitional dilator set exchanged for 5 French sheath over 0.035 inch guidewire. Rim catheter advanced to the aortic bifurcation and Bentson wire advanced to the level of the right common femoral artery. Rim catheter exchanged for 5 French glide catheter. Waltman loop formed in the distal abdominal aorta. Glide cath advanced into the right internal iliac artery and angiogram was performed. Patent uterine artery was seen. Right ureter artery was selected with the 5 French glide cath and angiogram was performed confirming flow to the uterus. Progreat  microcatheter and double angle GT microwire were utilized to advanced access to the horizontal segment of the uterine artery. Position was confirmed through contrast injection under fluoroscopy. Embolization was performed with 1 vial of 500-700 embospheres and 2 vials of 700-900 mL spheres. Post embolization angiogram performed from the glide cath showed significantly decreased flow to the uterus. The glide catheter was then utilized to select the left internal iliac artery. Left internal iliac angiogram showed a diminutive uterine artery. Given that the dominant right uterine artery was successfully embolized, decision was made not to pursue the left uterine artery. The glide cath was exchanged for an Omni flush catheter which was advanced to the upper abdominal aorta. An aortogram was performed and no ovarian arteries were identified. Angiogram performed through the left groin sheath showed appropriate puncture of the common femoral artery at the level of the femoral head. The external segment of the sheath surrounding skin was prepped and draped in the usual sterile fashion. Closure was performed with 6 [place] Angio-Seal device. 2 minutes of additional manual compression was applied for hemostasis. IMPRESSION: 1. Successful embolization of the dominant right uterine artery. 2. Diminutive left ureter artery too small  to access. 3. No ovarian artery supply identified on aortogram. Electronically Signed   By: Miachel Roux M.D.   On: 05/09/2021 16:13   IR Angiogram Pelvis Selective Or Supraselective  Result Date: 05/09/2021 INDICATION: 51 year old woman with prolapsing uterine fibroid/mass presents to IR for given artery embolization prior to hysterectomy. EXAM: 1. Ultrasound-guided access of left common femoral artery. 2. Aortogram. 3. Bilateral internal iliac arteriogram. 4. Right uterine arteriogram and embolization. 5. Angio-Seal closure of left common femoral artery access site. MEDICATIONS: Ancef 2 g IV.  The antibiotic was administered within 1 hour of the procedure Toradol 30 mg IV Zofran 4 mg IV Nitroglycerin 200 mg intra arterial ANESTHESIA/SEDATION: Moderate (conscious) sedation was employed during this procedure. A total of Versed 2 mg and Fentanyl 100 mcg was administered intravenously by the radiology nurse. Total intra-service moderate Sedation Time: 65 minutes. The patient's level of consciousness and vital signs were monitored continuously by radiology nursing throughout the procedure under my direct supervision. CONTRAST:  100 mL intra-arterial of Omnipaque 300 FLUOROSCOPY: Radiation Exposure Index (as provided by the fluoroscopic device): 299 mGy Kerma COMPLICATIONS: None immediate. PROCEDURE: Informed consent was obtained from the patient following explanation of the procedure, risks, benefits and alternatives. The patient understands, agrees and consents for the procedure. All questions were addressed. A time out was performed prior to the initiation of the procedure. Maximal barrier sterile technique utilized including caps, mask, sterile gowns, sterile gloves, large sterile drape, hand hygiene, and Betadine prep. Patient positioned supine on the procedure table. The left groin skin was prepped and draped in usual fashion. Ultrasound image documenting patency of the left common femoral artery was obtained and placed in permanent medical record. Sterile ultrasound probe cover and gel utilized throughout the procedure. Utilizing CT guidance, left common femoral artery was accessed at the level of the femoral head using a 21 gauge needle. 21 gauge needle exchanged for transitional dilator set over 0.018 inch guidewire. Transitional dilator set exchanged for 5 French sheath over 0.035 inch guidewire. Rim catheter advanced to the aortic bifurcation and Bentson wire advanced to the level of the right common femoral artery. Rim catheter exchanged for 5 French glide catheter. Waltman loop formed in the distal  abdominal aorta. Glide cath advanced into the right internal iliac artery and angiogram was performed. Patent uterine artery was seen. Right ureter artery was selected with the 5 French glide cath and angiogram was performed confirming flow to the uterus. Progreat microcatheter and double angle GT microwire were utilized to advanced access to the horizontal segment of the uterine artery. Position was confirmed through contrast injection under fluoroscopy. Embolization was performed with 1 vial of 500-700 embospheres and 2 vials of 700-900 mL spheres. Post embolization angiogram performed from the glide cath showed significantly decreased flow to the uterus. The glide catheter was then utilized to select the left internal iliac artery. Left internal iliac angiogram showed a diminutive uterine artery. Given that the dominant right uterine artery was successfully embolized, decision was made not to pursue the left uterine artery. The glide cath was exchanged for an Omni flush catheter which was advanced to the upper abdominal aorta. An aortogram was performed and no ovarian arteries were identified. Angiogram performed through the left groin sheath showed appropriate puncture of the common femoral artery at the level of the femoral head. The external segment of the sheath surrounding skin was prepped and draped in the usual sterile fashion. Closure was performed with 6 [place] Angio-Seal device. 2 minutes of additional  manual compression was applied for hemostasis. IMPRESSION: 1. Successful embolization of the dominant right uterine artery. 2. Diminutive left ureter artery too small to access. 3. No ovarian artery supply identified on aortogram. Electronically Signed   By: Miachel Roux M.D.   On: 05/09/2021 16:13   IR Angiogram Selective Each Additional Vessel  Result Date: 05/09/2021 INDICATION: 51 year old woman with prolapsing uterine fibroid/mass presents to IR for given artery embolization prior to  hysterectomy. EXAM: 1. Ultrasound-guided access of left common femoral artery. 2. Aortogram. 3. Bilateral internal iliac arteriogram. 4. Right uterine arteriogram and embolization. 5. Angio-Seal closure of left common femoral artery access site. MEDICATIONS: Ancef 2 g IV. The antibiotic was administered within 1 hour of the procedure Toradol 30 mg IV Zofran 4 mg IV Nitroglycerin 200 mg intra arterial ANESTHESIA/SEDATION: Moderate (conscious) sedation was employed during this procedure. A total of Versed 2 mg and Fentanyl 100 mcg was administered intravenously by the radiology nurse. Total intra-service moderate Sedation Time: 65 minutes. The patient's level of consciousness and vital signs were monitored continuously by radiology nursing throughout the procedure under my direct supervision. CONTRAST:  100 mL intra-arterial of Omnipaque 300 FLUOROSCOPY: Radiation Exposure Index (as provided by the fluoroscopic device): 510 mGy Kerma COMPLICATIONS: None immediate. PROCEDURE: Informed consent was obtained from the patient following explanation of the procedure, risks, benefits and alternatives. The patient understands, agrees and consents for the procedure. All questions were addressed. A time out was performed prior to the initiation of the procedure. Maximal barrier sterile technique utilized including caps, mask, sterile gowns, sterile gloves, large sterile drape, hand hygiene, and Betadine prep. Patient positioned supine on the procedure table. The left groin skin was prepped and draped in usual fashion. Ultrasound image documenting patency of the left common femoral artery was obtained and placed in permanent medical record. Sterile ultrasound probe cover and gel utilized throughout the procedure. Utilizing CT guidance, left common femoral artery was accessed at the level of the femoral head using a 21 gauge needle. 21 gauge needle exchanged for transitional dilator set over 0.018 inch guidewire. Transitional  dilator set exchanged for 5 French sheath over 0.035 inch guidewire. Rim catheter advanced to the aortic bifurcation and Bentson wire advanced to the level of the right common femoral artery. Rim catheter exchanged for 5 French glide catheter. Waltman loop formed in the distal abdominal aorta. Glide cath advanced into the right internal iliac artery and angiogram was performed. Patent uterine artery was seen. Right ureter artery was selected with the 5 French glide cath and angiogram was performed confirming flow to the uterus. Progreat microcatheter and double angle GT microwire were utilized to advanced access to the horizontal segment of the uterine artery. Position was confirmed through contrast injection under fluoroscopy. Embolization was performed with 1 vial of 500-700 embospheres and 2 vials of 700-900 mL spheres. Post embolization angiogram performed from the glide cath showed significantly decreased flow to the uterus. The glide catheter was then utilized to select the left internal iliac artery. Left internal iliac angiogram showed a diminutive uterine artery. Given that the dominant right uterine artery was successfully embolized, decision was made not to pursue the left uterine artery. The glide cath was exchanged for an Omni flush catheter which was advanced to the upper abdominal aorta. An aortogram was performed and no ovarian arteries were identified. Angiogram performed through the left groin sheath showed appropriate puncture of the common femoral artery at the level of the femoral head. The external segment of the sheath  surrounding skin was prepped and draped in the usual sterile fashion. Closure was performed with 6 [place] Angio-Seal device. 2 minutes of additional manual compression was applied for hemostasis. IMPRESSION: 1. Successful embolization of the dominant right uterine artery. 2. Diminutive left ureter artery too small to access. 3. No ovarian artery supply identified on aortogram.  Electronically Signed   By: Miachel Roux M.D.   On: 05/09/2021 16:13   IR US Guide Vasc Access Left  Result Date: 05/09/2021 INDICATION: 51 year old woman with prolapsing uterine fibroid/mass presents to IR for given artery embolization prior to hysterectomy. EXAM: 1. Ultrasound-guided access of left common femoral artery. 2. Aortogram. 3. Bilateral internal iliac arteriogram. 4. Right uterine arteriogram and embolization. 5. Angio-Seal closure of left common femoral artery access site. MEDICATIONS: Ancef 2 g IV. The antibiotic was administered within 1 hour of the procedure Toradol 30 mg IV Zofran 4 mg IV Nitroglycerin 200 mg intra arterial ANESTHESIA/SEDATION: Moderate (conscious) sedation was employed during this procedure. A total of Versed 2 mg and Fentanyl 100 mcg was administered intravenously by the radiology nurse. Total intra-service moderate Sedation Time: 65 minutes. The patient's level of consciousness and vital signs were monitored continuously by radiology nursing throughout the procedure under my direct supervision. CONTRAST:  100 mL intra-arterial of Omnipaque 300 FLUOROSCOPY: Radiation Exposure Index (as provided by the fluoroscopic device): 528 mGy Kerma COMPLICATIONS: None immediate. PROCEDURE: Informed consent was obtained from the patient following explanation of the procedure, risks, benefits and alternatives. The patient understands, agrees and consents for the procedure. All questions were addressed. A time out was performed prior to the initiation of the procedure. Maximal barrier sterile technique utilized including caps, mask, sterile gowns, sterile gloves, large sterile drape, hand hygiene, and Betadine prep. Patient positioned supine on the procedure table. The left groin skin was prepped and draped in usual fashion. Ultrasound image documenting patency of the left common femoral artery was obtained and placed in permanent medical record. Sterile ultrasound probe cover and gel  utilized throughout the procedure. Utilizing CT guidance, left common femoral artery was accessed at the level of the femoral head using a 21 gauge needle. 21 gauge needle exchanged for transitional dilator set over 0.018 inch guidewire. Transitional dilator set exchanged for 5 French sheath over 0.035 inch guidewire. Rim catheter advanced to the aortic bifurcation and Bentson wire advanced to the level of the right common femoral artery. Rim catheter exchanged for 5 French glide catheter. Waltman loop formed in the distal abdominal aorta. Glide cath advanced into the right internal iliac artery and angiogram was performed. Patent uterine artery was seen. Right ureter artery was selected with the 5 French glide cath and angiogram was performed confirming flow to the uterus. Progreat microcatheter and double angle GT microwire were utilized to advanced access to the horizontal segment of the uterine artery. Position was confirmed through contrast injection under fluoroscopy. Embolization was performed with 1 vial of 500-700 embospheres and 2 vials of 700-900 mL spheres. Post embolization angiogram performed from the glide cath showed significantly decreased flow to the uterus. The glide catheter was then utilized to select the left internal iliac artery. Left internal iliac angiogram showed a diminutive uterine artery. Given that the dominant right uterine artery was successfully embolized, decision was made not to pursue the left uterine artery. The glide cath was exchanged for an Omni flush catheter which was advanced to the upper abdominal aorta. An aortogram was performed and no ovarian arteries were identified. Angiogram performed through the left  groin sheath showed appropriate puncture of the common femoral artery at the level of the femoral head. The external segment of the sheath surrounding skin was prepped and draped in the usual sterile fashion. Closure was performed with 6 [place] Angio-Seal device. 2  minutes of additional manual compression was applied for hemostasis. IMPRESSION: 1. Successful embolization of the dominant right uterine artery. 2. Diminutive left ureter artery too small to access. 3. No ovarian artery supply identified on aortogram. Electronically Signed   By: Miachel Roux M.D.   On: 05/09/2021 16:13   IR EMBO TUMOR ORGAN ISCHEMIA INFARCT INC GUIDE ROADMAPPING  Result Date: 05/09/2021 INDICATION: 51 year old woman with prolapsing uterine fibroid/mass presents to IR for given artery embolization prior to hysterectomy. EXAM: 1. Ultrasound-guided access of left common femoral artery. 2. Aortogram. 3. Bilateral internal iliac arteriogram. 4. Right uterine arteriogram and embolization. 5. Angio-Seal closure of left common femoral artery access site. MEDICATIONS: Ancef 2 g IV. The antibiotic was administered within 1 hour of the procedure Toradol 30 mg IV Zofran 4 mg IV Nitroglycerin 200 mg intra arterial ANESTHESIA/SEDATION: Moderate (conscious) sedation was employed during this procedure. A total of Versed 2 mg and Fentanyl 100 mcg was administered intravenously by the radiology nurse. Total intra-service moderate Sedation Time: 65 minutes. The patient's level of consciousness and vital signs were monitored continuously by radiology nursing throughout the procedure under my direct supervision. CONTRAST:  100 mL intra-arterial of Omnipaque 300 FLUOROSCOPY: Radiation Exposure Index (as provided by the fluoroscopic device): 062 mGy Kerma COMPLICATIONS: None immediate. PROCEDURE: Informed consent was obtained from the patient following explanation of the procedure, risks, benefits and alternatives. The patient understands, agrees and consents for the procedure. All questions were addressed. A time out was performed prior to the initiation of the procedure. Maximal barrier sterile technique utilized including caps, mask, sterile gowns, sterile gloves, large sterile drape, hand hygiene, and Betadine  prep. Patient positioned supine on the procedure table. The left groin skin was prepped and draped in usual fashion. Ultrasound image documenting patency of the left common femoral artery was obtained and placed in permanent medical record. Sterile ultrasound probe cover and gel utilized throughout the procedure. Utilizing CT guidance, left common femoral artery was accessed at the level of the femoral head using a 21 gauge needle. 21 gauge needle exchanged for transitional dilator set over 0.018 inch guidewire. Transitional dilator set exchanged for 5 French sheath over 0.035 inch guidewire. Rim catheter advanced to the aortic bifurcation and Bentson wire advanced to the level of the right common femoral artery. Rim catheter exchanged for 5 French glide catheter. Waltman loop formed in the distal abdominal aorta. Glide cath advanced into the right internal iliac artery and angiogram was performed. Patent uterine artery was seen. Right ureter artery was selected with the 5 French glide cath and angiogram was performed confirming flow to the uterus. Progreat microcatheter and double angle GT microwire were utilized to advanced access to the horizontal segment of the uterine artery. Position was confirmed through contrast injection under fluoroscopy. Embolization was performed with 1 vial of 500-700 embospheres and 2 vials of 700-900 mL spheres. Post embolization angiogram performed from the glide cath showed significantly decreased flow to the uterus. The glide catheter was then utilized to select the left internal iliac artery. Left internal iliac angiogram showed a diminutive uterine artery. Given that the dominant right uterine artery was successfully embolized, decision was made not to pursue the left uterine artery. The glide cath was exchanged for an  Omni flush catheter which was advanced to the upper abdominal aorta. An aortogram was performed and no ovarian arteries were identified. Angiogram performed  through the left groin sheath showed appropriate puncture of the common femoral artery at the level of the femoral head. The external segment of the sheath surrounding skin was prepped and draped in the usual sterile fashion. Closure was performed with 6 [place] Angio-Seal device. 2 minutes of additional manual compression was applied for hemostasis. IMPRESSION: 1. Successful embolization of the dominant right uterine artery. 2. Diminutive left ureter artery too small to access. 3. No ovarian artery supply identified on aortogram. Electronically Signed   By: Miachel Roux M.D.   On: 05/09/2021 16:13   IR Radiologist Eval & Mgmt  Result Date: 04/11/2021 Please refer to notes tab for details about interventional procedure. (Op Note)   ASSESSMENT & PLAN Crystal Vasquez 51 y.o. female with medical history significant for iron deficiency anemia secondary to heavy menstrual bleeding who presents for a follow up visit.   After review of the labs, review of the records, and discussion with the patient the patients findings are most consistent with iron deficiency anemia secondary to GYN bleeding.  The patient has a known prolapsing fibroid for which she is being followed by Dr. Berline Lopes.  Our goal at this time will be to increase the patient's hemoglobin levels to acceptable level prior to surgery.  The surgery has been scheduled for 05/18/2021.    # Iron Deficiency Anemia 2/2 to GYN Bleeding -- Findings are consistent with iron deficiency anemia secondary to patient's heavy menstrual bleeding due to prolapsing fibroid. -- Patient currently connected with GYN oncological surgery for consideration of resection of her prolapsing fibroid.  Surgery is scheduled for 05/18/2021. --today will repeat iron panel and ferritin as well as CBC, and CMP --Continue ferrous sulfate 325 mg daily with a source of vitamin C -- Hemoglobin levels have decreased unfortunately rather than improving with IV iron therapy.  Her  hemoglobin is currently 9.3. --Recommend having blood products on standby prior to her planned operation. --Hematology will plan to see her approximately 4 to 6 weeks after her procedure.    No orders of the defined types were placed in this encounter.   All questions were answered. The patient knows to call the clinic with any problems, questions or concerns.  A total of more than 30 minutes were spent on this encounter with face-to-face time and non-face-to-face time, including preparing to see the patient, ordering tests and/or medications, counseling the patient and coordination of care as outlined above.   Ledell Peoples, MD Department of Hematology/Oncology [place] at [facility] Phone: [phone] Pager: [phone] Email: [email or web address]  05/10/2021 1:25 PM

## 2021-05-06 NOTE — Telephone Encounter (Signed)
Attempted to contact patient again to review recent lab work from 05/06/21. Unable to contact patient. Left message requesting return call.

## 2021-05-09 ENCOUNTER — Other Ambulatory Visit (HOSPITAL_COMMUNITY): Payer: Self-pay | Admitting: Interventional Radiology

## 2021-05-09 ENCOUNTER — Other Ambulatory Visit: Payer: Self-pay

## 2021-05-09 ENCOUNTER — Ambulatory Visit (HOSPITAL_COMMUNITY)
Admission: RE | Admit: 2021-05-09 | Discharge: 2021-05-09 | Disposition: A | Discharge status: Home / Self Care | Payer: Managed Care, Other (non HMO) | Source: Ambulatory Visit | Attending: Interventional Radiology | Admitting: Interventional Radiology

## 2021-05-09 ENCOUNTER — Ambulatory Visit (HOSPITAL_COMMUNITY)
Admission: RE | Admit: 2021-05-09 | Discharge: 2021-05-10 | Disposition: A | Discharge status: Home / Self Care | Payer: Managed Care, Other (non HMO) | Source: Ambulatory Visit | Attending: Interventional Radiology | Admitting: Interventional Radiology

## 2021-05-09 ENCOUNTER — Encounter (HOSPITAL_COMMUNITY): Payer: Self-pay

## 2021-05-09 ENCOUNTER — Encounter: Payer: Self-pay | Admitting: Hematology and Oncology

## 2021-05-09 VITALS — BP 124/99 | HR 70 | Ht 66.0 in | Wt 178.6 lb

## 2021-05-09 VITALS — BP 129/63 | HR 65 | Temp 98.4°F | Resp 18 | Ht 66.0 in | Wt 177.2 lb

## 2021-05-09 DIAGNOSIS — N939 Abnormal uterine and vaginal bleeding, unspecified: Secondary | ICD-10-CM | POA: Insufficient documentation

## 2021-05-09 DIAGNOSIS — E119 Type 2 diabetes mellitus without complications: Secondary | ICD-10-CM | POA: Insufficient documentation

## 2021-05-09 DIAGNOSIS — Z79899 Other long term (current) drug therapy: Secondary | ICD-10-CM | POA: Insufficient documentation

## 2021-05-09 DIAGNOSIS — I10 Essential (primary) hypertension: Secondary | ICD-10-CM | POA: Insufficient documentation

## 2021-05-09 DIAGNOSIS — K219 Gastro-esophageal reflux disease without esophagitis: Secondary | ICD-10-CM | POA: Insufficient documentation

## 2021-05-09 DIAGNOSIS — D259 Leiomyoma of uterus, unspecified: Secondary | ICD-10-CM

## 2021-05-09 DIAGNOSIS — N840 Polyp of corpus uteri: Secondary | ICD-10-CM | POA: Insufficient documentation

## 2021-05-09 DIAGNOSIS — F32A Depression, unspecified: Secondary | ICD-10-CM | POA: Insufficient documentation

## 2021-05-09 DIAGNOSIS — J45909 Unspecified asthma, uncomplicated: Secondary | ICD-10-CM | POA: Insufficient documentation

## 2021-05-09 DIAGNOSIS — Z7984 Long term (current) use of oral hypoglycemic drugs: Secondary | ICD-10-CM | POA: Insufficient documentation

## 2021-05-09 DIAGNOSIS — U071 COVID-19: Secondary | ICD-10-CM | POA: Insufficient documentation

## 2021-05-09 DIAGNOSIS — Z01818 Encounter for other preprocedural examination: Secondary | ICD-10-CM

## 2021-05-09 DIAGNOSIS — Z86018 Personal history of other benign neoplasm: Secondary | ICD-10-CM

## 2021-05-09 DIAGNOSIS — D649 Anemia, unspecified: Secondary | ICD-10-CM | POA: Insufficient documentation

## 2021-05-09 HISTORY — PX: IR EMBO TUMOR ORGAN ISCHEMIA INFARCT INC GUIDE ROADMAPPING: IMG5449

## 2021-05-09 HISTORY — PX: IR ANGIOGRAM PELVIS SELECTIVE OR SUPRASELECTIVE: IMG661

## 2021-05-09 HISTORY — PX: IR US GUIDE VASC ACCESS LEFT: IMG2389

## 2021-05-09 HISTORY — PX: IR ANGIOGRAM SELECTIVE EACH ADDITIONAL VESSEL: IMG667

## 2021-05-09 HISTORY — PX: IR AORTAGRAM ABDOMINAL SERIALOGRAM: IMG636

## 2021-05-09 LAB — SARS CORONAVIRUS 2 BY RT PCR (HOSPITAL ORDER, PERFORMED IN ~~LOC~~ HOSPITAL LAB): SARS Coronavirus 2: POSITIVE — AB

## 2021-05-09 LAB — GLUCOSE, CAPILLARY: Glucose-Capillary: 225 mg/dL — ABNORMAL HIGH (ref 70–99)

## 2021-05-09 MED ORDER — SODIUM CHLORIDE 0.9% FLUSH
3.0000 mL | Freq: Two times a day (BID) | INTRAVENOUS | Status: DC
  Administered 2021-05-09: 3 mL via INTRAVENOUS

## 2021-05-09 MED ORDER — LISINOPRIL 20 MG PO TABS
40.0000 mg | ORAL_TABLET | Freq: Every day | ORAL | Status: DC
  Administered 2021-05-10: 40 mg via ORAL
  Filled 2021-05-09: qty 2

## 2021-05-09 MED ORDER — ONDANSETRON HCL 4 MG/2ML IJ SOLN
INTRAMUSCULAR | Status: AC
  Filled 2021-05-09: qty 2

## 2021-05-09 MED ORDER — PROMETHAZINE HCL 25 MG PO TABS
25.0000 mg | ORAL_TABLET | Freq: Three times a day (TID) | ORAL | Status: DC | PRN
  Administered 2021-05-09 (×2): 25 mg via ORAL
  Filled 2021-05-09 (×2): qty 1

## 2021-05-09 MED ORDER — LIDOCAINE-EPINEPHRINE 1 %-1:100000 IJ SOLN: INTRAMUSCULAR | Status: AC | PRN

## 2021-05-09 MED ORDER — ONDANSETRON HCL 4 MG/2ML IJ SOLN
INTRAMUSCULAR | Status: AC | PRN
  Administered 2021-05-09: 4 mg via INTRAVENOUS

## 2021-05-09 MED ORDER — LIDOCAINE HCL 1 % IJ SOLN
INTRAMUSCULAR | Status: AC
  Filled 2021-05-09: qty 20

## 2021-05-09 MED ORDER — NALOXONE HCL 0.4 MG/ML IJ SOLN: 0.4000 mg | INTRAMUSCULAR | Status: DC | PRN

## 2021-05-09 MED ORDER — LACTATED RINGERS IV SOLN: INTRAVENOUS | Status: DC

## 2021-05-09 MED ORDER — SODIUM CHLORIDE 0.9% FLUSH: 9.0000 mL | INTRAVENOUS | Status: DC | PRN

## 2021-05-09 MED ORDER — PROMETHAZINE HCL 25 MG RE SUPP: 25.0000 mg | Freq: Three times a day (TID) | RECTAL | Status: DC | PRN

## 2021-05-09 MED ORDER — METOPROLOL SUCCINATE ER 25 MG PO TB24
25.0000 mg | ORAL_TABLET | Freq: Every day | ORAL | Status: DC
  Administered 2021-05-10: 25 mg via ORAL
  Filled 2021-05-09: qty 1

## 2021-05-09 MED ORDER — FENTANYL CITRATE (PF) 100 MCG/2ML IJ SOLN
INTRAMUSCULAR | Status: AC | PRN
  Administered 2021-05-09 (×2): 50 ug via INTRAVENOUS

## 2021-05-09 MED ORDER — DIPHENHYDRAMINE HCL 12.5 MG/5ML PO ELIX
12.5000 mg | ORAL_SOLUTION | Freq: Four times a day (QID) | ORAL | Status: DC | PRN
  Filled 2021-05-09: qty 5

## 2021-05-09 MED ORDER — SODIUM CHLORIDE 0.9% FLUSH: 3.0000 mL | INTRAVENOUS | Status: DC | PRN

## 2021-05-09 MED ORDER — GLIPIZIDE 10 MG PO TABS
10.0000 mg | ORAL_TABLET | Freq: Two times a day (BID) | ORAL | Status: DC
  Administered 2021-05-09 – 2021-05-10 (×2): 10 mg via ORAL
  Filled 2021-05-09 (×2): qty 1

## 2021-05-09 MED ORDER — ONDANSETRON HCL 4 MG/2ML IJ SOLN: 4.0000 mg | Freq: Four times a day (QID) | INTRAMUSCULAR | Status: DC | PRN

## 2021-05-09 MED ORDER — MEDROXYPROGESTERONE ACETATE 10 MG PO TABS
10.0000 mg | ORAL_TABLET | Freq: Every day | ORAL | Status: DC
  Administered 2021-05-10: 10 mg via ORAL
  Filled 2021-05-09: qty 1

## 2021-05-09 MED ORDER — IOHEXOL 300 MG/ML  SOLN: 100.0000 mL | Freq: Once | INTRAMUSCULAR | Status: DC | PRN

## 2021-05-09 MED ORDER — SENNOSIDES-DOCUSATE SODIUM 8.6-50 MG PO TABS: 2.0000 | ORAL_TABLET | Freq: Every day | ORAL | Status: DC

## 2021-05-09 MED ORDER — METFORMIN HCL 500 MG PO TABS: 1000.0000 mg | ORAL_TABLET | Freq: Two times a day (BID) | ORAL | Status: DC

## 2021-05-09 MED ORDER — CEFAZOLIN SODIUM-DEXTROSE 2-4 GM/100ML-% IV SOLN
INTRAVENOUS | Status: AC
  Administered 2021-05-09: 2 g via INTRAVENOUS
  Filled 2021-05-09: qty 100

## 2021-05-09 MED ORDER — LIDOCAINE HCL (PF) 1 % IJ SOLN
INTRAMUSCULAR | Status: AC | PRN
  Administered 2021-05-09: 10 mL

## 2021-05-09 MED ORDER — NITROGLYCERIN IN D5W 100-5 MCG/ML-% IV SOLN
INTRAVENOUS | Status: AC
  Filled 2021-05-09: qty 250

## 2021-05-09 MED ORDER — HYDROMORPHONE HCL 1 MG/ML IJ SOLN
INTRAMUSCULAR | Status: AC
  Filled 2021-05-09: qty 1

## 2021-05-09 MED ORDER — NITROGLYCERIN 1 MG/10 ML FOR IR/CATH LAB
INTRA_ARTERIAL | Status: AC | PRN
Start: 2021-05-09 — End: 2021-05-09
  Administered 2021-05-09 (×2): 200 ug via INTRA_ARTERIAL

## 2021-05-09 MED ORDER — HYDROMORPHONE 1 MG/ML IV SOLN
INTRAVENOUS | Status: DC
  Filled 2021-05-09: qty 30

## 2021-05-09 MED ORDER — FERROUS SULFATE 325 (65 FE) MG PO TABS
325.0000 mg | ORAL_TABLET | Freq: Every morning | ORAL | Status: DC
  Administered 2021-05-10: 325 mg via ORAL
  Filled 2021-05-09: qty 1

## 2021-05-09 MED ORDER — FENTANYL CITRATE (PF) 100 MCG/2ML IJ SOLN
INTRAMUSCULAR | Status: AC
  Filled 2021-05-09: qty 2

## 2021-05-09 MED ORDER — KETOROLAC TROMETHAMINE 30 MG/ML IJ SOLN: 30.0000 mg | INTRAMUSCULAR | Status: AC

## 2021-05-09 MED ORDER — SODIUM CHLORIDE 0.9 % IV SOLN: 250.0000 mL | INTRAVENOUS | Status: DC | PRN

## 2021-05-09 MED ORDER — MIDAZOLAM HCL 2 MG/2ML IJ SOLN
INTRAMUSCULAR | Status: AC | PRN
  Administered 2021-05-09 (×2): 1 mg via INTRAVENOUS

## 2021-05-09 MED ORDER — DIPHENHYDRAMINE HCL 50 MG/ML IJ SOLN: 12.5000 mg | Freq: Four times a day (QID) | INTRAMUSCULAR | Status: DC | PRN

## 2021-05-09 MED ORDER — HYDROCHLOROTHIAZIDE 25 MG PO TABS
25.0000 mg | ORAL_TABLET | Freq: Every day | ORAL | Status: DC
  Administered 2021-05-10: 25 mg via ORAL
  Filled 2021-05-09: qty 1

## 2021-05-09 MED ORDER — PANTOPRAZOLE SODIUM 40 MG PO TBEC
40.0000 mg | DELAYED_RELEASE_TABLET | Freq: Every day | ORAL | Status: DC
  Administered 2021-05-10: 40 mg via ORAL
  Filled 2021-05-09: qty 1

## 2021-05-09 MED ORDER — CEFAZOLIN SODIUM-DEXTROSE 2-4 GM/100ML-% IV SOLN: 2.0000 g | INTRAVENOUS | Status: AC

## 2021-05-09 MED ORDER — KETOROLAC TROMETHAMINE 30 MG/ML IJ SOLN
INTRAMUSCULAR | Status: AC
  Administered 2021-05-09: 30 mg via INTRAVENOUS
  Filled 2021-05-09: qty 1

## 2021-05-09 MED ORDER — FLUTICASONE FUROATE 27.5 MCG/SPRAY NA SUSP: 1.0000 | Freq: Every morning | NASAL | Status: DC

## 2021-05-09 MED ORDER — FLUTICASONE PROPIONATE 50 MCG/ACT NA SUSP
1.0000 | Freq: Every day | NASAL | Status: DC
  Administered 2021-05-10: 1 via NASAL
  Filled 2021-05-09: qty 16

## 2021-05-09 MED ORDER — DOCUSATE SODIUM 100 MG PO CAPS
100.0000 mg | ORAL_CAPSULE | Freq: Two times a day (BID) | ORAL | Status: DC
  Filled 2021-05-09: qty 1

## 2021-05-09 MED ORDER — AMLODIPINE BESYLATE 10 MG PO TABS
10.0000 mg | ORAL_TABLET | Freq: Every day | ORAL | Status: DC
  Administered 2021-05-10: 10 mg via ORAL
  Filled 2021-05-09: qty 1

## 2021-05-09 MED ORDER — SODIUM CHLORIDE 0.9 % IV SOLN: INTRAVENOUS | Status: DC

## 2021-05-09 MED ORDER — ONDANSETRON HCL 4 MG/2ML IJ SOLN
4.0000 mg | Freq: Four times a day (QID) | INTRAMUSCULAR | Status: DC | PRN
  Administered 2021-05-09 – 2021-05-10 (×2): 4 mg via INTRAVENOUS
  Filled 2021-05-09 (×2): qty 2

## 2021-05-09 MED ORDER — MIDAZOLAM HCL 2 MG/2ML IJ SOLN
INTRAMUSCULAR | Status: AC
  Filled 2021-05-09: qty 4

## 2021-05-09 MED ORDER — KETOROLAC TROMETHAMINE 30 MG/ML IJ SOLN
30.0000 mg | Freq: Four times a day (QID) | INTRAMUSCULAR | Status: DC
  Administered 2021-05-09 – 2021-05-10 (×4): 30 mg via INTRAVENOUS
  Filled 2021-05-09 (×5): qty 1

## 2021-05-09 NOTE — H&P (Deleted)
  The note originally documented on this encounter has been moved the the encounter in which it belongs.  

## 2021-05-09 NOTE — Progress Notes (Signed)
Wasted 28 ML of Hydromorphone with [employer]

## 2021-05-09 NOTE — H&P (Signed)
Referring Physician(s): [place]  Supervising Physician: Mir, Sharen Heck  Patient Status:  [facility] OP TBA  Chief Complaint: Vaginal bleeding, prolapsed uterine polyp   Subjective: Patient familiar to IR service from consultation with Dr. Dwaine Gale on 04/11/21 . She has a chronic history of extensive vaginal bleeding recently requiring IV iron transfusion with MRI in October of last year showing a large prolapsing uterine polyp.  Past medical history also significant for anemia, asthma, depression, diabetes, GERD, hypertension, obesity.  She presents today for uterine artery embolization prior to planned hysterectomy.  She is also noted to be COVID-19 positive today.  She currently denies fever, headache, chest pain, cough, vomiting.  She does have some dyspnea with exertion, abdominal and back discomfort and vaginal bleeding as discussed above.  Past Medical History:  Diagnosis Date   Anemia    Asthma    due to acid reflux   Depression    DM2    Dyspnea    GERD (gastroesophageal reflux disease)    Headache    Heart murmur    benign   Hypertension    Obesity (BMI 30-39.9)    Thyroid disease    Past Surgical History:  Procedure Laterality Date   BIOPSY THYROID     IR RADIOLOGIST EVAL & MGMT  04/11/2021      Allergies: Patient has no known allergies.  Medications: Prior to Admission medications   Medication Sig Start Date End Date Taking? Authorizing Provider  acetaminophen (TYLENOL) 325 MG tablet Take 650 mg by mouth every 6 (six) hours as needed for moderate pain.   Yes [provider]  amLODipine (NORVASC) 10 MG tablet Take 1 tablet (10 mg total) by mouth daily. 02/18/21  Yes Dorna Mai, MD  ferrous sulfate 325 (65 FE) MG tablet Take 325 mg by mouth in the morning.   Yes [provider]  fluticasone (VERAMYST) 27.5 MCG/SPRAY nasal spray Place 1 spray into the nose in the morning.   Yes [provider]  glipiZIDE (GLUCOTROL) 10 MG tablet Take 1  tablet (10 mg total) by mouth 2 (two) times daily before a meal. 02/18/21  Yes Dorna Mai, MD  hydrochlorothiazide (HYDRODIURIL) 25 MG tablet Take 1 tablet (25 mg total) by mouth daily. 02/18/21  Yes Dorna Mai, MD  lisinopril (ZESTRIL) 40 MG tablet Take 1 tablet (40 mg total) by mouth daily. 02/18/21  Yes Dorna Mai, MD  medroxyPROGESTERone (PROVERA) 10 MG tablet Take 1 tablet (10 mg total) by mouth daily. 05/05/21  Yes Cross, Lenna Sciara D, NP  metFORMIN (GLUCOPHAGE) 1000 MG tablet Take 1 tablet (1,000 mg total) by mouth 2 (two) times daily with a meal. 02/18/21  Yes Dorna Mai, MD  metoprolol succinate (TOPROL-XL) 25 MG 24 hr tablet Take 1 tablet (25 mg total) by mouth daily. 02/18/21  Yes Dorna Mai, MD  olmesartan (BENICAR) 40 MG tablet Take 40 mg by mouth in the morning.   Yes [provider]  omeprazole (PRILOSEC) 20 MG capsule Take 20 mg by mouth daily before breakfast.   Yes [provider]  senna-docusate (SENOKOT-S) 8.6-50 MG tablet Take 2 tablets by mouth at bedtime. For AFTER surgery, do not take if having diarrhea 05/05/21   Joylene John D, NP  traMADol (ULTRAM) 50 MG tablet Take 1 tablet (50 mg total) by mouth every 6 (six) hours as needed for severe pain. For AFTER surgery only, do not take and drive 0/35/59   Joylene John D, NP     Vital Signs: Blood  pressure 126/71, temperature 98.7, heart rate 66, respirations 16, O2 sat 98% room air  LMP 04/27/2021 (Exact Date)   Physical Exam awake, alert.  Chest clear to auscultation bilaterally.  Heart with regular rate and rhythm.  Abdomen soft, positive bowel sounds, some mild lower anterior pelvic tenderness to palpation.  No significant lower extremity edema  Imaging: No results found.  Labs:  CBC: Recent Labs    11/11/20 1545 03/25/21 1446 03/29/21 1210 05/06/21 0756  WBC 5.8 9.4 8.0 11.1*  HGB 9.3* 9.7* 10.5* 9.3*  HCT 28.5* 32.0* 34.5* 29.7*  PLT 430 430* 438* 437*    COAGS: Recent  Labs    05/06/21 0756  INR 0.9    BMP: Recent Labs    10/20/20 0711 10/27/20 1340 03/29/21 1210 05/06/21 0756  NA 137 136 139 135  K 3.8 3.6 3.7 3.6  CL 105 99 102 101  CO2 25 25 27 24   GLUCOSE 251* 358* 91 145*  BUN 12 9 20 11   CALCIUM 9.0 9.2 10.0 9.1  CREATININE 0.66 0.70 0.72 0.54  GFRNONAA >60 >60 >60 >60    LIVER FUNCTION TESTS: Recent Labs    10/20/20 0711 10/27/20 1340 03/29/21 1210 05/06/21 0756  BILITOT 0.6 0.3 0.2* <0.1*  AST 17 16 11* 13*  ALT 15 17 13 15   ALKPHOS 51 63 48 60  PROT 6.9 7.7 7.6 7.2  ALBUMIN 3.4* 4.0 4.3 3.7    Assessment and Plan: Patient familiar to IR service from consultation with Dr. Dwaine Gale on 04/11/21 . She has a chronic history of extensive vaginal bleeding recently requiring IV iron transfusion with MRI in October of last year showing a large prolapsing uterine polyp.  Past medical history also significant for anemia, asthma, depression, diabetes, GERD, hypertension, obesity.  She presents today for uterine artery embolization prior to planned hysterectomy.  She is also noted to be COVID-19 positive today. Risks and benefits of procedure were discussed with the patient including, but not limited to bleeding, infection, vascular injury or contrast induced renal failure.  This interventional procedure involves the use of X-rays and because of the nature of the planned procedure, it is possible that we will have prolonged use of X-ray fluoroscopy.  Potential radiation risks to you include (but are not limited to) the following: - A slightly elevated risk for cancer  several years later in life. This risk is typically less than 0.5% percent. This risk is low in comparison to the normal incidence of human cancer, which is 33% for women and 50% for men according to the [employer]. - Radiation induced injury can include skin redness, resembling a rash, tissue breakdown / ulcers and hair loss (which can be temporary or permanent).    The likelihood of either of these occurring depends on the difficulty of the procedure and whether you are sensitive to radiation due to previous procedures, disease, or genetic conditions.   IF your procedure requires a prolonged use of radiation, you will be notified and given written instructions for further action.  It is your responsibility to monitor the irradiated area for the 2 weeks following the procedure and to notify your physician if you are concerned that you have suffered a radiation induced injury.    All of the patient's questions were answered, patient is agreeable to proceed.  Consent signed and in chart.  Post procedure patient will be admitted for overnight observation for pain control.    Electronically Signed: D. Rowe Robert, PA-C 05/09/2021,  9:18 AM   I spent a total of 30 minutes at the the patient's bedside AND on the patient's hospital floor or unit, greater than 50% of which was counseling/coordinating care for bilateral uterine artery embolization

## 2021-05-09 NOTE — Procedures (Signed)
Interventional Radiology Procedure Note  Procedure: Uterine artery embolization  Indication: Uterine mass.  Pre-hysterectomy.  Findings: Please refer to procedural dictation for full description.  Complications: None  EBL: < 10 mL  Miachel Roux, MD [phone]

## 2021-05-10 ENCOUNTER — Encounter: Payer: Self-pay | Admitting: Hematology and Oncology

## 2021-05-10 MED ORDER — DOCUSATE SODIUM 100 MG PO CAPS: 100.0000 mg | ORAL_CAPSULE | Freq: Two times a day (BID) | ORAL | 0 refills | Status: AC

## 2021-05-10 MED ORDER — HYDROCODONE-ACETAMINOPHEN 5-325 MG PO TABS: 1.0000 | ORAL_TABLET | Freq: Four times a day (QID) | ORAL | Status: DC | PRN

## 2021-05-10 MED ORDER — ONDANSETRON HCL 4 MG PO TABS: 4.0000 mg | ORAL_TABLET | Freq: Three times a day (TID) | ORAL | 0 refills | Status: AC | PRN

## 2021-05-10 MED ORDER — OXYCODONE HCL 5 MG PO TABS: 5.0000 mg | ORAL_TABLET | Freq: Four times a day (QID) | ORAL | 0 refills | Status: AC | PRN

## 2021-05-10 NOTE — Plan of Care (Signed)
°  Problem: Education: Goal: Knowledge of General Education information will improve Description: Including pain rating scale, medication(s)/side effects and non-pharmacologic comfort measures Outcome: Adequate for Discharge   Problem: Health Behavior/Discharge Planning: Goal: Ability to manage health-related needs will improve Outcome: Adequate for Discharge   Problem: Clinical Measurements: Goal: Ability to maintain clinical measurements within normal limits will improve Outcome: Adequate for Discharge Goal: Will remain free from infection Outcome: Adequate for Discharge Goal: Diagnostic test results will improve Outcome: Adequate for Discharge Goal: Respiratory complications will improve Outcome: Adequate for Discharge Goal: Cardiovascular complication will be avoided Outcome: Adequate for Discharge   Problem: Nutrition: Goal: Adequate nutrition will be maintained Outcome: Adequate for Discharge   Problem: Pain Managment: Goal: General experience of comfort will improve Outcome: Adequate for Discharge   Problem: Skin Integrity: Goal: Risk for impaired skin integrity will decrease Outcome: Adequate for Discharge

## 2021-05-10 NOTE — Discharge Summary (Signed)
Patient ID: Crystal Vasquez MRN: [id number] DOB/AGE: 1970-06-22 51 y.o.  Admit date: 05/09/2021 Discharge date: 05/10/2021  Supervising Physician: Mir, Sharen Heck  Patient Status: [facility] - In-pt  Admission Diagnoses: Uterine polyp s/p uterine artery embolization 05/09/21 with Dr. Dwaine Gale.   Discharge Diagnoses: Same  Discharged Condition: good  Hospital Course: Patient with a chronic history of extensive vaginal bleeding secondary to large, prolapsing uterine polyp. She presented to the [facility] department 05/09/21 for an image-guided uterine artery embolization prior to planned hysterectomy at a later date. She was incidentally found to be Covid positive. She underwent the procedure as planned and had an uneventful night.   This morning she is feeling well with minimal pain and some mild nausea. She has eaten, ambulated and voided. A friend will be providing transportation home. The left vascular access site is clean, soft, dry and non-tender. She was advised to take off the dressing this evening; ok to shower but not to submerge the site for one week.   Prescriptions for zofran, oxycodone and colace were e-prescribed to her pharmacy. She will follow up with Dr. Dwaine Gale in one month. She knows to call our office with any questions/concerns prior to her visit.   Consults: none  Significant Diagnostic Studies: IR Aortagram Abdominal Serialogram  Result Date: 05/09/2021 INDICATION: 51 year old woman with prolapsing uterine fibroid/mass presents to IR for given artery embolization prior to hysterectomy. EXAM: 1. Ultrasound-guided access of left common femoral artery. 2. Aortogram. 3. Bilateral internal iliac arteriogram. 4. Right uterine arteriogram and embolization. 5. Angio-Seal closure of left common femoral artery access site. MEDICATIONS: Ancef 2 g IV. The antibiotic was administered within 1 hour of the procedure Toradol 30 mg IV Zofran 4 mg IV Nitroglycerin 200 mg  intra arterial ANESTHESIA/SEDATION: Moderate (conscious) sedation was employed during this procedure. A total of Versed 2 mg and Fentanyl 100 mcg was administered intravenously by the radiology nurse. Total intra-service moderate Sedation Time: 65 minutes. The patient's level of consciousness and vital signs were monitored continuously by radiology nursing throughout the procedure under my direct supervision. CONTRAST:  100 mL intra-arterial of Omnipaque 300 FLUOROSCOPY: Radiation Exposure Index (as provided by the fluoroscopic device): 426 mGy Kerma COMPLICATIONS: None immediate. PROCEDURE: Informed consent was obtained from the patient following explanation of the procedure, risks, benefits and alternatives. The patient understands, agrees and consents for the procedure. All questions were addressed. A time out was performed prior to the initiation of the procedure. Maximal barrier sterile technique utilized including caps, mask, sterile gowns, sterile gloves, large sterile drape, hand hygiene, and Betadine prep. Patient positioned supine on the procedure table. The left groin skin was prepped and draped in usual fashion. Ultrasound image documenting patency of the left common femoral artery was obtained and placed in permanent medical record. Sterile ultrasound probe cover and gel utilized throughout the procedure. Utilizing CT guidance, left common femoral artery was accessed at the level of the femoral head using a 21 gauge needle. 21 gauge needle exchanged for transitional dilator set over 0.018 inch guidewire. Transitional dilator set exchanged for 5 French sheath over 0.035 inch guidewire. Rim catheter advanced to the aortic bifurcation and Bentson wire advanced to the level of the right common femoral artery. Rim catheter exchanged for 5 French glide catheter. Waltman loop formed in the distal abdominal aorta. Glide cath advanced into the right internal iliac artery and angiogram was performed. Patent  uterine artery was seen. Right ureter artery was selected with the 5 [place] glide  cath and angiogram was performed confirming flow to the uterus. Progreat microcatheter and double angle GT microwire were utilized to advanced access to the horizontal segment of the uterine artery. Position was confirmed through contrast injection under fluoroscopy. Embolization was performed with 1 vial of 500-700 embospheres and 2 vials of 700-900 mL spheres. Post embolization angiogram performed from the glide cath showed significantly decreased flow to the uterus. The glide catheter was then utilized to select the left internal iliac artery. Left internal iliac angiogram showed a diminutive uterine artery. Given that the dominant right uterine artery was successfully embolized, decision was made not to pursue the left uterine artery. The glide cath was exchanged for an Omni flush catheter which was advanced to the upper abdominal aorta. An aortogram was performed and no ovarian arteries were identified. Angiogram performed through the left groin sheath showed appropriate puncture of the common femoral artery at the level of the femoral head. The external segment of the sheath surrounding skin was prepped and draped in the usual sterile fashion. Closure was performed with 6 [place] Angio-Seal device. 2 minutes of additional manual compression was applied for hemostasis. IMPRESSION: 1. Successful embolization of the dominant right uterine artery. 2. Diminutive left ureter artery too small to access. 3. No ovarian artery supply identified on aortogram. Electronically Signed   By: Miachel Roux M.D.   On: 05/09/2021 16:13   IR Angiogram Pelvis Selective Or Supraselective  Result Date: 05/09/2021 INDICATION: 51 year old woman with prolapsing uterine fibroid/mass presents to IR for given artery embolization prior to hysterectomy. EXAM: 1. Ultrasound-guided access of left common femoral artery. 2. Aortogram. 3. Bilateral internal iliac  arteriogram. 4. Right uterine arteriogram and embolization. 5. Angio-Seal closure of left common femoral artery access site. MEDICATIONS: Ancef 2 g IV. The antibiotic was administered within 1 hour of the procedure Toradol 30 mg IV Zofran 4 mg IV Nitroglycerin 200 mg intra arterial ANESTHESIA/SEDATION: Moderate (conscious) sedation was employed during this procedure. A total of Versed 2 mg and Fentanyl 100 mcg was administered intravenously by the radiology nurse. Total intra-service moderate Sedation Time: 65 minutes. The patient's level of consciousness and vital signs were monitored continuously by radiology nursing throughout the procedure under my direct supervision. CONTRAST:  100 mL intra-arterial of Omnipaque 300 FLUOROSCOPY: Radiation Exposure Index (as provided by the fluoroscopic device): 852 mGy Kerma COMPLICATIONS: None immediate. PROCEDURE: Informed consent was obtained from the patient following explanation of the procedure, risks, benefits and alternatives. The patient understands, agrees and consents for the procedure. All questions were addressed. A time out was performed prior to the initiation of the procedure. Maximal barrier sterile technique utilized including caps, mask, sterile gowns, sterile gloves, large sterile drape, hand hygiene, and Betadine prep. Patient positioned supine on the procedure table. The left groin skin was prepped and draped in usual fashion. Ultrasound image documenting patency of the left common femoral artery was obtained and placed in permanent medical record. Sterile ultrasound probe cover and gel utilized throughout the procedure. Utilizing CT guidance, left common femoral artery was accessed at the level of the femoral head using a 21 gauge needle. 21 gauge needle exchanged for transitional dilator set over 0.018 inch guidewire. Transitional dilator set exchanged for 5 French sheath over 0.035 inch guidewire. Rim catheter advanced to the aortic bifurcation and  Bentson wire advanced to the level of the right common femoral artery. Rim catheter exchanged for 5 French glide catheter. Waltman loop formed in the distal abdominal aorta. Glide cath advanced into the  right internal iliac artery and angiogram was performed. Patent uterine artery was seen. Right ureter artery was selected with the 5 French glide cath and angiogram was performed confirming flow to the uterus. Progreat microcatheter and double angle GT microwire were utilized to advanced access to the horizontal segment of the uterine artery. Position was confirmed through contrast injection under fluoroscopy. Embolization was performed with 1 vial of 500-700 embospheres and 2 vials of 700-900 mL spheres. Post embolization angiogram performed from the glide cath showed significantly decreased flow to the uterus. The glide catheter was then utilized to select the left internal iliac artery. Left internal iliac angiogram showed a diminutive uterine artery. Given that the dominant right uterine artery was successfully embolized, decision was made not to pursue the left uterine artery. The glide cath was exchanged for an Omni flush catheter which was advanced to the upper abdominal aorta. An aortogram was performed and no ovarian arteries were identified. Angiogram performed through the left groin sheath showed appropriate puncture of the common femoral artery at the level of the femoral head. The external segment of the sheath surrounding skin was prepped and draped in the usual sterile fashion. Closure was performed with 6 [place] Angio-Seal device. 2 minutes of additional manual compression was applied for hemostasis. IMPRESSION: 1. Successful embolization of the dominant right uterine artery. 2. Diminutive left ureter artery too small to access. 3. No ovarian artery supply identified on aortogram. Electronically Signed   By: Miachel Roux M.D.   On: 05/09/2021 16:13   IR Angiogram Selective Each Additional  Vessel  Result Date: 05/09/2021 INDICATION: 51 year old woman with prolapsing uterine fibroid/mass presents to IR for given artery embolization prior to hysterectomy. EXAM: 1. Ultrasound-guided access of left common femoral artery. 2. Aortogram. 3. Bilateral internal iliac arteriogram. 4. Right uterine arteriogram and embolization. 5. Angio-Seal closure of left common femoral artery access site. MEDICATIONS: Ancef 2 g IV. The antibiotic was administered within 1 hour of the procedure Toradol 30 mg IV Zofran 4 mg IV Nitroglycerin 200 mg intra arterial ANESTHESIA/SEDATION: Moderate (conscious) sedation was employed during this procedure. A total of Versed 2 mg and Fentanyl 100 mcg was administered intravenously by the radiology nurse. Total intra-service moderate Sedation Time: 65 minutes. The patient's level of consciousness and vital signs were monitored continuously by radiology nursing throughout the procedure under my direct supervision. CONTRAST:  100 mL intra-arterial of Omnipaque 300 FLUOROSCOPY: Radiation Exposure Index (as provided by the fluoroscopic device): 536 mGy Kerma COMPLICATIONS: None immediate. PROCEDURE: Informed consent was obtained from the patient following explanation of the procedure, risks, benefits and alternatives. The patient understands, agrees and consents for the procedure. All questions were addressed. A time out was performed prior to the initiation of the procedure. Maximal barrier sterile technique utilized including caps, mask, sterile gowns, sterile gloves, large sterile drape, hand hygiene, and Betadine prep. Patient positioned supine on the procedure table. The left groin skin was prepped and draped in usual fashion. Ultrasound image documenting patency of the left common femoral artery was obtained and placed in permanent medical record. Sterile ultrasound probe cover and gel utilized throughout the procedure. Utilizing CT guidance, left common femoral artery was accessed at  the level of the femoral head using a 21 gauge needle. 21 gauge needle exchanged for transitional dilator set over 0.018 inch guidewire. Transitional dilator set exchanged for 5 French sheath over 0.035 inch guidewire. Rim catheter advanced to the aortic bifurcation and Bentson wire advanced to the level of the right common  femoral artery. Rim catheter exchanged for 5 French glide catheter. Waltman loop formed in the distal abdominal aorta. Glide cath advanced into the right internal iliac artery and angiogram was performed. Patent uterine artery was seen. Right ureter artery was selected with the 5 French glide cath and angiogram was performed confirming flow to the uterus. Progreat microcatheter and double angle GT microwire were utilized to advanced access to the horizontal segment of the uterine artery. Position was confirmed through contrast injection under fluoroscopy. Embolization was performed with 1 vial of 500-700 embospheres and 2 vials of 700-900 mL spheres. Post embolization angiogram performed from the glide cath showed significantly decreased flow to the uterus. The glide catheter was then utilized to select the left internal iliac artery. Left internal iliac angiogram showed a diminutive uterine artery. Given that the dominant right uterine artery was successfully embolized, decision was made not to pursue the left uterine artery. The glide cath was exchanged for an Omni flush catheter which was advanced to the upper abdominal aorta. An aortogram was performed and no ovarian arteries were identified. Angiogram performed through the left groin sheath showed appropriate puncture of the common femoral artery at the level of the femoral head. The external segment of the sheath surrounding skin was prepped and draped in the usual sterile fashion. Closure was performed with 6 [place] Angio-Seal device. 2 minutes of additional manual compression was applied for hemostasis. IMPRESSION: 1. Successful  embolization of the dominant right uterine artery. 2. Diminutive left ureter artery too small to access. 3. No ovarian artery supply identified on aortogram. Electronically Signed   By: Miachel Roux M.D.   On: 05/09/2021 16:13   IR US Guide Vasc Access Left  Result Date: 05/09/2021 INDICATION: 51 year old woman with prolapsing uterine fibroid/mass presents to IR for given artery embolization prior to hysterectomy. EXAM: 1. Ultrasound-guided access of left common femoral artery. 2. Aortogram. 3. Bilateral internal iliac arteriogram. 4. Right uterine arteriogram and embolization. 5. Angio-Seal closure of left common femoral artery access site. MEDICATIONS: Ancef 2 g IV. The antibiotic was administered within 1 hour of the procedure Toradol 30 mg IV Zofran 4 mg IV Nitroglycerin 200 mg intra arterial ANESTHESIA/SEDATION: Moderate (conscious) sedation was employed during this procedure. A total of Versed 2 mg and Fentanyl 100 mcg was administered intravenously by the radiology nurse. Total intra-service moderate Sedation Time: 65 minutes. The patient's level of consciousness and vital signs were monitored continuously by radiology nursing throughout the procedure under my direct supervision. CONTRAST:  100 mL intra-arterial of Omnipaque 300 FLUOROSCOPY: Radiation Exposure Index (as provided by the fluoroscopic device): 425 mGy Kerma COMPLICATIONS: None immediate. PROCEDURE: Informed consent was obtained from the patient following explanation of the procedure, risks, benefits and alternatives. The patient understands, agrees and consents for the procedure. All questions were addressed. A time out was performed prior to the initiation of the procedure. Maximal barrier sterile technique utilized including caps, mask, sterile gowns, sterile gloves, large sterile drape, hand hygiene, and Betadine prep. Patient positioned supine on the procedure table. The left groin skin was prepped and draped in usual fashion. Ultrasound  image documenting patency of the left common femoral artery was obtained and placed in permanent medical record. Sterile ultrasound probe cover and gel utilized throughout the procedure. Utilizing CT guidance, left common femoral artery was accessed at the level of the femoral head using a 21 gauge needle. 21 gauge needle exchanged for transitional dilator set over 0.018 inch guidewire. Transitional dilator set exchanged for 5 [place]  sheath over 0.035 inch guidewire. Rim catheter advanced to the aortic bifurcation and Bentson wire advanced to the level of the right common femoral artery. Rim catheter exchanged for 5 French glide catheter. Waltman loop formed in the distal abdominal aorta. Glide cath advanced into the right internal iliac artery and angiogram was performed. Patent uterine artery was seen. Right ureter artery was selected with the 5 French glide cath and angiogram was performed confirming flow to the uterus. Progreat microcatheter and double angle GT microwire were utilized to advanced access to the horizontal segment of the uterine artery. Position was confirmed through contrast injection under fluoroscopy. Embolization was performed with 1 vial of 500-700 embospheres and 2 vials of 700-900 mL spheres. Post embolization angiogram performed from the glide cath showed significantly decreased flow to the uterus. The glide catheter was then utilized to select the left internal iliac artery. Left internal iliac angiogram showed a diminutive uterine artery. Given that the dominant right uterine artery was successfully embolized, decision was made not to pursue the left uterine artery. The glide cath was exchanged for an Omni flush catheter which was advanced to the upper abdominal aorta. An aortogram was performed and no ovarian arteries were identified. Angiogram performed through the left groin sheath showed appropriate puncture of the common femoral artery at the level of the femoral head. The external  segment of the sheath surrounding skin was prepped and draped in the usual sterile fashion. Closure was performed with 6 [place] Angio-Seal device. 2 minutes of additional manual compression was applied for hemostasis. IMPRESSION: 1. Successful embolization of the dominant right uterine artery. 2. Diminutive left ureter artery too small to access. 3. No ovarian artery supply identified on aortogram. Electronically Signed   By: Miachel Roux M.D.   On: 05/09/2021 16:13   IR EMBO TUMOR ORGAN ISCHEMIA INFARCT INC GUIDE ROADMAPPING  Result Date: 05/09/2021 INDICATION: 51 year old woman with prolapsing uterine fibroid/mass presents to IR for given artery embolization prior to hysterectomy. EXAM: 1. Ultrasound-guided access of left common femoral artery. 2. Aortogram. 3. Bilateral internal iliac arteriogram. 4. Right uterine arteriogram and embolization. 5. Angio-Seal closure of left common femoral artery access site. MEDICATIONS: Ancef 2 g IV. The antibiotic was administered within 1 hour of the procedure Toradol 30 mg IV Zofran 4 mg IV Nitroglycerin 200 mg intra arterial ANESTHESIA/SEDATION: Moderate (conscious) sedation was employed during this procedure. A total of Versed 2 mg and Fentanyl 100 mcg was administered intravenously by the radiology nurse. Total intra-service moderate Sedation Time: 65 minutes. The patient's level of consciousness and vital signs were monitored continuously by radiology nursing throughout the procedure under my direct supervision. CONTRAST:  100 mL intra-arterial of Omnipaque 300 FLUOROSCOPY: Radiation Exposure Index (as provided by the fluoroscopic device): 673 mGy Kerma COMPLICATIONS: None immediate. PROCEDURE: Informed consent was obtained from the patient following explanation of the procedure, risks, benefits and alternatives. The patient understands, agrees and consents for the procedure. All questions were addressed. A time out was performed prior to the initiation of the  procedure. Maximal barrier sterile technique utilized including caps, mask, sterile gowns, sterile gloves, large sterile drape, hand hygiene, and Betadine prep. Patient positioned supine on the procedure table. The left groin skin was prepped and draped in usual fashion. Ultrasound image documenting patency of the left common femoral artery was obtained and placed in permanent medical record. Sterile ultrasound probe cover and gel utilized throughout the procedure. Utilizing CT guidance, left common femoral artery was accessed at the level of the  femoral head using a 21 gauge needle. 21 gauge needle exchanged for transitional dilator set over 0.018 inch guidewire. Transitional dilator set exchanged for 5 French sheath over 0.035 inch guidewire. Rim catheter advanced to the aortic bifurcation and Bentson wire advanced to the level of the right common femoral artery. Rim catheter exchanged for 5 French glide catheter. Waltman loop formed in the distal abdominal aorta. Glide cath advanced into the right internal iliac artery and angiogram was performed. Patent uterine artery was seen. Right ureter artery was selected with the 5 French glide cath and angiogram was performed confirming flow to the uterus. Progreat microcatheter and double angle GT microwire were utilized to advanced access to the horizontal segment of the uterine artery. Position was confirmed through contrast injection under fluoroscopy. Embolization was performed with 1 vial of 500-700 embospheres and 2 vials of 700-900 mL spheres. Post embolization angiogram performed from the glide cath showed significantly decreased flow to the uterus. The glide catheter was then utilized to select the left internal iliac artery. Left internal iliac angiogram showed a diminutive uterine artery. Given that the dominant right uterine artery was successfully embolized, decision was made not to pursue the left uterine artery. The glide cath was exchanged for an Omni  flush catheter which was advanced to the upper abdominal aorta. An aortogram was performed and no ovarian arteries were identified. Angiogram performed through the left groin sheath showed appropriate puncture of the common femoral artery at the level of the femoral head. The external segment of the sheath surrounding skin was prepped and draped in the usual sterile fashion. Closure was performed with 6 [place] Angio-Seal device. 2 minutes of additional manual compression was applied for hemostasis. IMPRESSION: 1. Successful embolization of the dominant right uterine artery. 2. Diminutive left ureter artery too small to access. 3. No ovarian artery supply identified on aortogram. Electronically Signed   By: Miachel Roux M.D.   On: 05/09/2021 16:13   IR Radiologist Eval & Mgmt  Result Date: 04/11/2021 Please refer to notes tab for details about interventional procedure. (Op Note)   Treatments: Observation; pain management   Discharge Exam: Height 5\' 6"  (1.676 m), weight 178 lb 9.2 oz (81 kg), last menstrual period 04/27/2021. Physical Exam Constitutional:      General: She is not in acute distress.    Appearance: She is not ill-appearing.  HENT:     Mouth/Throat:     Mouth: Mucous membranes are moist.     Pharynx: Oropharynx is clear.  Cardiovascular:     Comments: Left groin vascular site is clean, soft, dry and non-tender. Dressing is clean/dry.  Pulmonary:     Effort: Pulmonary effort is normal.  Abdominal:     Tenderness: There is no abdominal tenderness.  Skin:    General: Skin is warm and dry.  Neurological:     Mental Status: She is alert and oriented to person, place, and time.    Disposition: Discharge disposition: 01-Home or Self Care   Allergies as of 05/10/2021   No Known Allergies      Medication List     TAKE these medications    acetaminophen 325 MG tablet Commonly known as: TYLENOL Take 650 mg by mouth every 6 (six) hours as needed for moderate pain.    amLODipine 10 MG tablet Commonly known as: NORVASC Take 1 tablet (10 mg total) by mouth daily.   docusate sodium 100 MG capsule Commonly known as: Colace Take 1 capsule (100 mg total) by mouth  2 (two) times daily for 14 days.   ferrous sulfate 325 (65 FE) MG tablet Take 325 mg by mouth in the morning.   fluticasone 27.5 MCG/SPRAY nasal spray Commonly known as: VERAMYST Place 1 spray into the nose in the morning.   glipiZIDE 10 MG tablet Commonly known as: GLUCOTROL Take 1 tablet (10 mg total) by mouth 2 (two) times daily before a meal.   hydrochlorothiazide 25 MG tablet Commonly known as: HYDRODIURIL Take 1 tablet (25 mg total) by mouth daily.   lisinopril 40 MG tablet Commonly known as: ZESTRIL Take 1 tablet (40 mg total) by mouth daily.   medroxyPROGESTERone 10 MG tablet Commonly known as: Provera Take 1 tablet (10 mg total) by mouth daily.   metFORMIN 1000 MG tablet Commonly known as: GLUCOPHAGE Take 1 tablet (1,000 mg total) by mouth 2 (two) times daily with a meal.   metoprolol succinate 25 MG 24 hr tablet Commonly known as: TOPROL-XL Take 1 tablet (25 mg total) by mouth daily.   omeprazole 20 MG capsule Commonly known as: PRILOSEC Take 20 mg by mouth daily before breakfast.   omeprazole 20 MG tablet Commonly known as: PRILOSEC OTC Take 20 mg by mouth every morning.   ondansetron 4 MG tablet Commonly known as: Zofran Take 1 tablet (4 mg total) by mouth every 8 (eight) hours as needed for up to 7 days for nausea or vomiting.   oxyCODONE 5 MG immediate release tablet Commonly known as: Roxicodone Take 1 tablet (5 mg total) by mouth every 6 (six) hours as needed for up to 5 days for severe pain.   senna-docusate 8.6-50 MG tablet Commonly known as: Senokot-S Take 2 tablets by mouth at bedtime. For AFTER surgery, do not take if having diarrhea   traMADol 50 MG tablet Commonly known as: ULTRAM Take 1 tablet (50 mg total) by mouth every 6 (six) hours as  needed for severe pain. For AFTER surgery only, do not take and drive        Follow-up Information     [place] Follow up.   Why: Please follow up with Dr. Dwaine Gale in one month. A scheduler from our office will call you with a date/time. Please call our office prior to your visit if you have any questions/concerns. Contact information: [place] [address] [phone]                  Electronically Signed: Theresa Duty, NP 05/10/2021, 9:27 AM   I have spent Less Than 30 Minutes discharging [place].

## 2021-05-10 NOTE — TOC Transition Note (Addendum)
Transition of Care [facility]) - CM/SW Discharge Note   Patient Details  Name: Crystal Vasquez MRN: [id number] Date of Birth: April 02, 1970  Transition of Care [facility]) CM/SW Contact:  Ross Ludwig, LCSW Phone Number: 05/10/2021, 2:33 PM   Clinical Narrative:    CSW was informed that patient needs some assistance for her medications due to not having any insurance.  CSW provided patient with Good Rx coupons for medications.  CSW signing off, no other reported needs.         Patient Goals and CMS Choice        Discharge Placement  Plan to discharge back home.                     Discharge Plan and Services                                     Social Determinants of Health (SDOH) Interventions     Readmission Risk Interventions No flowsheet data found.

## 2021-05-11 ENCOUNTER — Encounter: Payer: Self-pay | Admitting: Hematology and Oncology

## 2021-05-11 NOTE — Telephone Encounter (Signed)
Patient returned call.  Reviewed recent lab work with her (TSH 0.313 & A1C 7.2). The TSH levels are routed to PCP.  Patient stated that once she gets a job and is more active that her levels will go up.  She also stated that she gets nauseous sometimes and carbs like mashed potatoes are the easiest thing to get down.  Suggested patient to watch sugar intake and try SF applesauce, greek yogurt or homemade guacamole. She agreed to those suggestions and will give them a try.   Patient had a recent uterine artery embolization and stated that she is feeling good, just a little nausea from coming off anesthesia. Informed patient to call back with any questions or concerns.

## 2021-05-11 NOTE — Telephone Encounter (Signed)
Called patient regarding recent lab work. Unable to contact patient and LM to return call.

## 2021-05-16 ENCOUNTER — Other Ambulatory Visit: Payer: Self-pay

## 2021-05-16 ENCOUNTER — Ambulatory Visit: Payer: Self-pay | Attending: Family

## 2021-05-16 ENCOUNTER — Encounter: Payer: Self-pay | Admitting: Hematology and Oncology

## 2021-05-17 ENCOUNTER — Telehealth: Payer: Self-pay

## 2021-05-17 NOTE — Telephone Encounter (Signed)
Telephone call to check on pre-operative status.  Patient compliant with pre-operative instructions.  Reinforced nothing to eat after midnight. Clear liquids until 12:15 pm. Patient to arrive at 12:30 pm.  No questions or concerns voiced.  Instructed to call for any needs.

## 2021-05-18 ENCOUNTER — Encounter (HOSPITAL_COMMUNITY): Payer: Self-pay | Admitting: Gynecologic Oncology

## 2021-05-18 ENCOUNTER — Ambulatory Visit (HOSPITAL_COMMUNITY): Payer: Self-pay | Admitting: Physician Assistant

## 2021-05-18 ENCOUNTER — Ambulatory Visit (HOSPITAL_BASED_OUTPATIENT_CLINIC_OR_DEPARTMENT_OTHER): Payer: Self-pay | Admitting: Certified Registered Nurse Anesthetist

## 2021-05-18 ENCOUNTER — Other Ambulatory Visit: Payer: Self-pay

## 2021-05-18 ENCOUNTER — Ambulatory Visit (HOSPITAL_COMMUNITY)
Admission: RE | Admit: 2021-05-18 | Discharge: 2021-05-18 | Disposition: A | Discharge status: Home / Self Care | Payer: Self-pay | Source: Ambulatory Visit | Attending: Gynecologic Oncology | Admitting: Gynecologic Oncology

## 2021-05-18 ENCOUNTER — Encounter (HOSPITAL_COMMUNITY)
Admission: RE | Disposition: A | Discharge status: Home / Self Care | Payer: Self-pay | Source: Ambulatory Visit | Attending: Gynecologic Oncology

## 2021-05-18 DIAGNOSIS — N184 Chronic kidney disease, stage 4 (severe): Secondary | ICD-10-CM

## 2021-05-18 DIAGNOSIS — D259 Leiomyoma of uterus, unspecified: Secondary | ICD-10-CM | POA: Insufficient documentation

## 2021-05-18 DIAGNOSIS — Z7984 Long term (current) use of oral hypoglycemic drugs: Secondary | ICD-10-CM

## 2021-05-18 DIAGNOSIS — K219 Gastro-esophageal reflux disease without esophagitis: Secondary | ICD-10-CM | POA: Insufficient documentation

## 2021-05-18 DIAGNOSIS — N939 Abnormal uterine and vaginal bleeding, unspecified: Secondary | ICD-10-CM

## 2021-05-18 DIAGNOSIS — J45909 Unspecified asthma, uncomplicated: Secondary | ICD-10-CM | POA: Insufficient documentation

## 2021-05-18 DIAGNOSIS — I10 Essential (primary) hypertension: Secondary | ICD-10-CM | POA: Insufficient documentation

## 2021-05-18 DIAGNOSIS — E119 Type 2 diabetes mellitus without complications: Secondary | ICD-10-CM | POA: Insufficient documentation

## 2021-05-18 DIAGNOSIS — N858 Other specified noninflammatory disorders of uterus: Secondary | ICD-10-CM

## 2021-05-18 DIAGNOSIS — D4959 Neoplasm of unspecified behavior of other genitourinary organ: Secondary | ICD-10-CM

## 2021-05-18 DIAGNOSIS — N814 Uterovaginal prolapse, unspecified: Secondary | ICD-10-CM

## 2021-05-18 DIAGNOSIS — Z8616 Personal history of COVID-19: Secondary | ICD-10-CM | POA: Insufficient documentation

## 2021-05-18 DIAGNOSIS — F32A Depression, unspecified: Secondary | ICD-10-CM | POA: Insufficient documentation

## 2021-05-18 HISTORY — PX: ROBOTIC ASSISTED LAPAROSCOPIC HYSTERECTOMY AND SALPINGECTOMY: SHX6379

## 2021-05-18 LAB — PREGNANCY, URINE: Preg Test, Ur: NEGATIVE

## 2021-05-18 LAB — GLUCOSE, CAPILLARY: Glucose-Capillary: 181 mg/dL — ABNORMAL HIGH (ref 70–99)

## 2021-05-18 SURGERY — XI ROBOTIC ASSISTED LAPAROSCOPIC HYSTERECTOMY AND SALPINGECTOMY
Anesthesia: General | Laterality: Bilateral

## 2021-05-18 MED ORDER — FENTANYL CITRATE PF 50 MCG/ML IJ SOSY: 25.0000 ug | PREFILLED_SYRINGE | INTRAMUSCULAR | Status: DC | PRN

## 2021-05-18 MED ORDER — ONDANSETRON HCL 4 MG/2ML IJ SOLN
INTRAMUSCULAR | Status: AC
  Filled 2021-05-18: qty 2

## 2021-05-18 MED ORDER — DEXAMETHASONE SODIUM PHOSPHATE 10 MG/ML IJ SOLN
INTRAMUSCULAR | Status: DC | PRN
  Administered 2021-05-18: 4 mg via INTRAVENOUS

## 2021-05-18 MED ORDER — HEPARIN SODIUM (PORCINE) 5000 UNIT/ML IJ SOLN
5000.0000 [IU] | INTRAMUSCULAR | Status: AC
  Administered 2021-05-18: 5000 [IU] via SUBCUTANEOUS
  Filled 2021-05-18: qty 1

## 2021-05-18 MED ORDER — AMISULPRIDE (ANTIEMETIC) 5 MG/2ML IV SOLN
INTRAVENOUS | Status: AC
  Filled 2021-05-18: qty 4

## 2021-05-18 MED ORDER — CHLORHEXIDINE GLUCONATE 0.12 % MT SOLN
15.0000 mL | Freq: Once | OROMUCOSAL | Status: AC
  Administered 2021-05-18: 15 mL via OROMUCOSAL

## 2021-05-18 MED ORDER — FENTANYL CITRATE (PF) 100 MCG/2ML IJ SOLN
INTRAMUSCULAR | Status: AC
  Filled 2021-05-18: qty 2

## 2021-05-18 MED ORDER — CEFAZOLIN SODIUM-DEXTROSE 2-4 GM/100ML-% IV SOLN
2.0000 g | INTRAVENOUS | Status: AC
  Administered 2021-05-18: 2 g via INTRAVENOUS
  Filled 2021-05-18: qty 100

## 2021-05-18 MED ORDER — ACETAMINOPHEN 500 MG PO TABS
1000.0000 mg | ORAL_TABLET | ORAL | Status: AC
  Administered 2021-05-18: 1000 mg via ORAL
  Filled 2021-05-18: qty 2

## 2021-05-18 MED ORDER — BUPIVACAINE HCL 0.25 % IJ SOLN
INTRAMUSCULAR | Status: DC | PRN
  Administered 2021-05-18: 32 mL

## 2021-05-18 MED ORDER — SUGAMMADEX SODIUM 200 MG/2ML IV SOLN
INTRAVENOUS | Status: DC | PRN
  Administered 2021-05-18: 200 mg via INTRAVENOUS

## 2021-05-18 MED ORDER — ONDANSETRON HCL 4 MG/2ML IJ SOLN
INTRAMUSCULAR | Status: DC | PRN
  Administered 2021-05-18: 4 mg via INTRAVENOUS

## 2021-05-18 MED ORDER — PROMETHAZINE HCL 25 MG/ML IJ SOLN
INTRAMUSCULAR | Status: AC
  Filled 2021-05-18: qty 1

## 2021-05-18 MED ORDER — LACTATED RINGERS IV SOLN: INTRAVENOUS | Status: DC

## 2021-05-18 MED ORDER — ROCURONIUM BROMIDE 10 MG/ML (PF) SYRINGE
PREFILLED_SYRINGE | INTRAVENOUS | Status: DC | PRN
  Administered 2021-05-18: 80 mg via INTRAVENOUS
  Administered 2021-05-18: 20 mg via INTRAVENOUS

## 2021-05-18 MED ORDER — DEXAMETHASONE SODIUM PHOSPHATE 10 MG/ML IJ SOLN
INTRAMUSCULAR | Status: AC
  Filled 2021-05-18: qty 1

## 2021-05-18 MED ORDER — BUPIVACAINE HCL 0.25 % IJ SOLN
INTRAMUSCULAR | Status: AC
  Filled 2021-05-18: qty 1

## 2021-05-18 MED ORDER — STERILE WATER FOR IRRIGATION IR SOLN
Status: DC | PRN
  Administered 2021-05-18: 1000 mL

## 2021-05-18 MED ORDER — BUPIVACAINE LIPOSOME 1.3 % IJ SUSP
INTRAMUSCULAR | Status: AC
  Filled 2021-05-18: qty 20

## 2021-05-18 MED ORDER — LIDOCAINE HCL (PF) 2 % IJ SOLN
INTRAMUSCULAR | Status: DC | PRN
  Administered 2021-05-18: 1.5 mg/kg/h via INTRADERMAL

## 2021-05-18 MED ORDER — CELECOXIB 200 MG PO CAPS
200.0000 mg | ORAL_CAPSULE | ORAL | Status: AC
  Administered 2021-05-18: 200 mg via ORAL
  Filled 2021-05-18: qty 1

## 2021-05-18 MED ORDER — ROCURONIUM BROMIDE 10 MG/ML (PF) SYRINGE
PREFILLED_SYRINGE | INTRAVENOUS | Status: AC
  Filled 2021-05-18: qty 10

## 2021-05-18 MED ORDER — LIDOCAINE 2% (20 MG/ML) 5 ML SYRINGE
INTRAMUSCULAR | Status: DC | PRN
Start: 2021-05-18 — End: 2021-05-18
  Administered 2021-05-18: 60 mg via INTRAVENOUS

## 2021-05-18 MED ORDER — EPHEDRINE 5 MG/ML INJ
INTRAVENOUS | Status: AC
  Filled 2021-05-18: qty 5

## 2021-05-18 MED ORDER — GABAPENTIN 300 MG PO CAPS
300.0000 mg | ORAL_CAPSULE | ORAL | Status: AC
  Administered 2021-05-18: 300 mg via ORAL
  Filled 2021-05-18: qty 1

## 2021-05-18 MED ORDER — ORAL CARE MOUTH RINSE: 15.0000 mL | Freq: Once | OROMUCOSAL | Status: AC

## 2021-05-18 MED ORDER — LACTATED RINGERS IR SOLN
Status: DC | PRN
  Administered 2021-05-18: 1000 mL

## 2021-05-18 MED ORDER — PROPOFOL 10 MG/ML IV BOLUS
INTRAVENOUS | Status: AC
Start: 2021-05-18 — End: ?
  Filled 2021-05-18: qty 20

## 2021-05-18 MED ORDER — MIDAZOLAM HCL 2 MG/2ML IJ SOLN
INTRAMUSCULAR | Status: AC
  Filled 2021-05-18: qty 2

## 2021-05-18 MED ORDER — PHENYLEPHRINE HCL-NACL 20-0.9 MG/250ML-% IV SOLN
INTRAVENOUS | Status: DC | PRN
  Administered 2021-05-18: 40 ug/min via INTRAVENOUS

## 2021-05-18 MED ORDER — AMISULPRIDE (ANTIEMETIC) 5 MG/2ML IV SOLN
10.0000 mg | Freq: Once | INTRAVENOUS | Status: AC | PRN
  Administered 2021-05-18: 10 mg via INTRAVENOUS

## 2021-05-18 MED ORDER — FENTANYL CITRATE (PF) 100 MCG/2ML IJ SOLN
INTRAMUSCULAR | Status: DC | PRN
  Administered 2021-05-18 (×3): 50 ug via INTRAVENOUS

## 2021-05-18 MED ORDER — SCOPOLAMINE 1 MG/3DAYS TD PT72
1.0000 | MEDICATED_PATCH | TRANSDERMAL | Status: DC
  Administered 2021-05-18: 1.5 mg via TRANSDERMAL
  Filled 2021-05-18: qty 1

## 2021-05-18 MED ORDER — DEXAMETHASONE SODIUM PHOSPHATE 4 MG/ML IJ SOLN: 4.0000 mg | INTRAMUSCULAR | Status: DC

## 2021-05-18 MED ORDER — PROPOFOL 10 MG/ML IV BOLUS
INTRAVENOUS | Status: DC | PRN
Start: 2021-05-18 — End: 2021-05-18
  Administered 2021-05-18: 40 mg via INTRAVENOUS
  Administered 2021-05-18: 130 mg via INTRAVENOUS

## 2021-05-18 MED ORDER — PROMETHAZINE HCL 25 MG/ML IJ SOLN
6.2500 mg | INTRAMUSCULAR | Status: DC | PRN
  Administered 2021-05-18: 6.25 mg via INTRAVENOUS

## 2021-05-18 MED ORDER — MIDAZOLAM HCL 5 MG/5ML IJ SOLN
INTRAMUSCULAR | Status: DC | PRN
  Administered 2021-05-18: 2 mg via INTRAVENOUS

## 2021-05-18 SURGICAL SUPPLY — 75 items
ADH SKN CLS APL DERMABOND .7 (GAUZE/BANDAGES/DRESSINGS) ×2
AGENT HMST KT MTR STRL THRMB (HEMOSTASIS)
APL ESCP 34 STRL LF DISP (HEMOSTASIS)
APPLICATOR SURGIFLO ENDO (HEMOSTASIS) IMPLANT
BACTOSHIELD CHG 4% 4OZ (MISCELLANEOUS) ×1
BAG COUNTER SPONGE SURGICOUNT (BAG) IMPLANT
BAG LAPAROSCOPIC 12 15 PORT 16 (BASKET) ×1 IMPLANT
BAG RETRIEVAL 12/15 (BASKET) ×3
BAG SPEC RTRVL LRG 6X4 10 (ENDOMECHANICALS)
BAG SPNG CNTER NS LX DISP (BAG)
BLADE SURG SZ10 CARB STEEL (BLADE) ×2 IMPLANT
COVER BACK TABLE 60X90IN (DRAPES) ×3 IMPLANT
COVER TIP SHEARS 8 DVNC (MISCELLANEOUS) ×2 IMPLANT
COVER TIP SHEARS 8MM DA VINCI (MISCELLANEOUS) ×3
DERMABOND ADVANCED (GAUZE/BANDAGES/DRESSINGS) ×1
DERMABOND ADVANCED .7 DNX12 (GAUZE/BANDAGES/DRESSINGS) ×2 IMPLANT
DRAPE ARM DVNC X/XI (DISPOSABLE) ×8 IMPLANT
DRAPE COLUMN DVNC XI (DISPOSABLE) ×2 IMPLANT
DRAPE DA VINCI XI ARM (DISPOSABLE) ×12
DRAPE DA VINCI XI COLUMN (DISPOSABLE) ×3
DRAPE SHEET LG 3/4 BI-LAMINATE (DRAPES) ×3 IMPLANT
DRAPE SURG IRRIG POUCH 19X23 (DRAPES) ×3 IMPLANT
DRSG OPSITE POSTOP 4X6 (GAUZE/BANDAGES/DRESSINGS) IMPLANT
DRSG OPSITE POSTOP 4X8 (GAUZE/BANDAGES/DRESSINGS) IMPLANT
ELECT PENCIL ROCKER SW 15FT (MISCELLANEOUS) ×2 IMPLANT
ELECT REM PT RETURN 15FT ADLT (MISCELLANEOUS) ×3 IMPLANT
GAUZE 4X4 16PLY ~~LOC~~+RFID DBL (SPONGE) ×3 IMPLANT
GLOVE SURG ENC MOIS LTX SZ6 (GLOVE) ×12 IMPLANT
GLOVE SURG ENC MOIS LTX SZ6.5 (GLOVE) ×6 IMPLANT
GOWN STRL REUS W/ TWL LRG LVL3 (GOWN DISPOSABLE) ×8 IMPLANT
GOWN STRL REUS W/TWL LRG LVL3 (GOWN DISPOSABLE) ×12
HOLDER FOLEY CATH W/STRAP (MISCELLANEOUS) ×2 IMPLANT
IRRIG SUCT STRYKERFLOW 2 WTIP (MISCELLANEOUS) ×3
IRRIGATION SUCT STRKRFLW 2 WTP (MISCELLANEOUS) ×2 IMPLANT
KIT PROCEDURE DA VINCI SI (MISCELLANEOUS)
KIT PROCEDURE DVNC SI (MISCELLANEOUS) IMPLANT
KIT TURNOVER KIT A (KITS) ×2 IMPLANT
MANIPULATOR ADVINCU DEL 3.0 PL (MISCELLANEOUS) ×2 IMPLANT
MANIPULATOR ADVINCU DEL 3.5 PL (MISCELLANEOUS) IMPLANT
MANIPULATOR ADVINCU DEL 4.0 PL (MISCELLANEOUS) ×2 IMPLANT
MANIPULATOR UTERINE 4.5 ZUMI (MISCELLANEOUS) IMPLANT
NDL HYPO 21X1.5 SAFETY (NEEDLE) ×1 IMPLANT
NDL SPNL 18GX3.5 QUINCKE PK (NEEDLE) IMPLANT
NEEDLE HYPO 21X1.5 SAFETY (NEEDLE) ×3 IMPLANT
NEEDLE SPNL 18GX3.5 QUINCKE PK (NEEDLE) IMPLANT
OBTURATOR OPTICAL STANDARD 8MM (TROCAR) ×3
OBTURATOR OPTICAL STND 8 DVNC (TROCAR) ×2
OBTURATOR OPTICALSTD 8 DVNC (TROCAR) ×2 IMPLANT
PACK ROBOT GYN CUSTOM WL (TRAY / TRAY PROCEDURE) ×3 IMPLANT
PAD POSITIONING PINK XL (MISCELLANEOUS) ×3 IMPLANT
PORT ACCESS TROCAR AIRSEAL 12 (TROCAR) ×2 IMPLANT
PORT ACCESS TROCAR AIRSEAL 5M (TROCAR) ×1
POUCH SPECIMEN RETRIEVAL 10MM (ENDOMECHANICALS) IMPLANT
SCRUB CHG 4% DYNA-HEX 4OZ (MISCELLANEOUS) ×2 IMPLANT
SEAL CANN UNIV 5-8 DVNC XI (MISCELLANEOUS) ×8 IMPLANT
SEAL XI 5MM-8MM UNIVERSAL (MISCELLANEOUS) ×12
SET TRI-LUMEN FLTR TB AIRSEAL (TUBING) ×3 IMPLANT
SPIKE FLUID TRANSFER (MISCELLANEOUS) ×3 IMPLANT
SPONGE T-LAP 18X18 ~~LOC~~+RFID (SPONGE) ×2 IMPLANT
SURGIFLO W/THROMBIN 8M KIT (HEMOSTASIS) IMPLANT
SUT MNCRL AB 4-0 PS2 18 (SUTURE) ×6 IMPLANT
SUT PDS AB 1 TP1 96 (SUTURE) ×4 IMPLANT
SUT VIC AB 0 CT1 27 (SUTURE) ×6
SUT VIC AB 0 CT1 27XBRD ANTBC (SUTURE) ×2 IMPLANT
SUT VIC AB 2-0 CT1 27 (SUTURE) ×3
SUT VIC AB 2-0 CT1 TAPERPNT 27 (SUTURE) ×1 IMPLANT
SUT VIC AB 4-0 PS2 18 (SUTURE) ×8 IMPLANT
SYR 10ML LL (SYRINGE) ×2 IMPLANT
TOWEL OR NON WOVEN STRL DISP B (DISPOSABLE) ×2 IMPLANT
TRAP SPECIMEN MUCUS 40CC (MISCELLANEOUS) IMPLANT
TRAY FOLEY MTR SLVR 16FR STAT (SET/KITS/TRAYS/PACK) ×3 IMPLANT
TROCAR XCEL NON-BLD 5MMX100MML (ENDOMECHANICALS) IMPLANT
UNDERPAD 30X36 HEAVY ABSORB (UNDERPADS AND DIAPERS) ×6 IMPLANT
WATER STERILE IRR 1000ML POUR (IV SOLUTION) ×3 IMPLANT
YANKAUER SUCT BULB TIP 10FT TU (MISCELLANEOUS) ×2 IMPLANT

## 2021-05-18 NOTE — Anesthesia Postprocedure Evaluation (Signed)
Anesthesia Post Note ? ?Patient: Crystal Vasquez ? ?Procedure(s) Performed: XI ROBOTIC ASSISTED LAPAROSCOPIC HYSTERECTOMY AND SALPINGECTOMY, UTERUS MORE THAN 250 GRAMS (Bilateral) ? ?  ? ?Patient location during evaluation: PACU ?Anesthesia Type: General ?Level of consciousness: awake and alert ?Pain management: pain level controlled ?Vital Signs Assessment: post-procedure vital signs reviewed and stable ?Respiratory status: spontaneous breathing, nonlabored ventilation, respiratory function stable and patient connected to nasal cannula oxygen ?Cardiovascular status: blood pressure returned to baseline and stable ?Postop Assessment: no apparent nausea or vomiting ?Anesthetic complications: no ? ? ?No notable events documented. ? ?Last Vitals:  ?Vitals:  ? 05/18/21 1238 05/18/21 1652  ?BP: 137/82 (!) 142/76  ?Pulse: 81 70  ?Resp: 17 14  ?Temp: 36.8 ?C 36.7 ?C  ?SpO2: 100% 96%  ?  ?Last Pain:  ?Vitals:  ? 05/18/21 1652  ?TempSrc:   ?PainSc: Asleep  ? ? ?  ?  ?  ?  ?  ?  ? ?Crystal Vasquez ? ? ? ? ?

## 2021-05-18 NOTE — H&P (Signed)
Gynecologic Oncology H&P ? ?05/18/21 ? ?Treatment History: ?12/03/2020: Referred to see me initially for prolapsing uterine versus intracervical mass. ?Biopsies of the mass showed acutely inflamed glandular mucosal tissue, no malignancy.  Findings may represent endometrial polyp prolapsing through the cervix. ?12/19/2020: MRI of the pelvis confirms 10 x 4 cm heterogenous mass in the endometrial canal that extends to the cervical os.  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  Scattered uterine leiomyomata are also noted ?05/09/21: [place] in anticipation of upcoming surgery ? ?Interval History: ?Doing well. ? ?Had [place] recently. ? ?Past Medical/Surgical History: ?Past Medical History:  ?Diagnosis Date  ? Anemia   ? Asthma   ? due to acid reflux  ? Depression   ? DM2   ? Dyspnea   ? GERD (gastroesophageal reflux disease)   ? Headache   ? Heart murmur   ? benign  ? Hypertension   ? Obesity (BMI 30-39.9)   ? Thyroid disease   ? ? ?Past Surgical History:  ?Procedure Laterality Date  ? BIOPSY THYROID    ? IR ANGIOGRAM PELVIS SELECTIVE OR SUPRASELECTIVE  05/09/2021  ? IR ANGIOGRAM SELECTIVE EACH ADDITIONAL VESSEL  05/09/2021  ? IR AORTAGRAM ABDOMINAL SERIALOGRAM  05/09/2021  ? IR EMBO TUMOR ORGAN ISCHEMIA INFARCT INC GUIDE ROADMAPPING  05/09/2021  ? IR RADIOLOGIST EVAL & MGMT  04/11/2021  ? IR US GUIDE VASC ACCESS LEFT  05/09/2021  ? ? ?Family History  ?Problem Relation Age of Onset  ? Endometrial cancer Maternal Aunt   ? Colon cancer Neg Hx   ? Breast cancer Neg Hx   ? Ovarian cancer Neg Hx   ? Pancreatic cancer Neg Hx   ? Prostate cancer Neg Hx   ? ? ?Social History  ? ?Socioeconomic History  ? Marital status: Single  ?  Spouse name: Not on file  ? Number of children: Not on file  ? Years of education: Not on file  ? Highest education level: Not on file  ?Occupational History  ? Not on file  ?Tobacco Use  ? Smoking status: Never  ? Smokeless tobacco: Never  ?Vaping Use  ? Vaping Use: Never used  ?Substance and Sexual  Activity  ? Alcohol use: Not Currently  ? Drug use: Never  ? Sexual activity: Not Currently  ?Other Topics Concern  ? Not on file  ?Social History Narrative  ? Not on file  ? ?Social Determinants of Health  ? ?Financial Resource Strain: Not on file  ?Food Insecurity: Food Insecurity Present  ? Worried About Charity fundraiser in the Last Year: Often true  ? Ran Out of Food in the Last Year: Sometimes true  ?Transportation Needs: Unmet Transportation Needs  ? Lack of Transportation (Medical): Yes  ? Lack of Transportation (Non-Medical): Yes  ?Physical Activity: Not on file  ?Stress: Not on file  ?Social Connections: Not on file  ? ? ?Current Medications: ? ?Current Facility-Administered Medications:  ?  acetaminophen (TYLENOL) tablet 1,000 mg, 1,000 mg, Oral, On Call to OR, Cross, Melissa D, NP ?  ceFAZolin (ANCEF) IVPB 2g/100 mL premix, 2 g, Intravenous, On Call to OR, Cross, Melissa D, NP ?  celecoxib (CELEBREX) capsule 200 mg, 200 mg, Oral, On Call to OR, Cross, Melissa D, NP ?  chlorhexidine (PERIDEX) 0.12 % solution 15 mL, 15 mL, Mouth/Throat, Once **OR** MEDLINE mouth rinse, 15 mL, Mouth Rinse, Once, Santa Lighter, MD ?  dexamethasone (DECADRON) injection 4 mg, 4 mg, Intravenous, On Call to OR,  Joylene John D, NP ?  gabapentin (NEURONTIN) capsule 300 mg, 300 mg, Oral, On Call to OR, Cross, Melissa D, NP ?  heparin injection 5,000 Units, 5,000 Units, Subcutaneous, 120 min pre-op, Cross, Melissa D, NP ?  lactated ringers infusion, , Intravenous, Continuous, Santa Lighter, MD ?  scopolamine (TRANSDERM-SCOP) 1 MG/3DAYS 1.5 mg, 1 patch, Transdermal, On Call to OR, Joylene John D, NP ? ? ?Physical Exam: ?BP 137/82   Pulse 81   Temp 98.2 ?F (36.8 ?C) (Oral)   Resp 17   LMP 04/27/2021 (Exact Date)   SpO2 100%  ?General: Alert, oriented, no acute distress. ?HEENT: Normocephalic, atraumatic, sclera anicteric. ?Chest: Clear to auscultation bilaterally.  No wheezes or rhonchi. ?Cardiovascular: Regular rate  and rhythm, no murmurs. ?Abdomen: soft, nontender.  Normoactive bowel sounds.  No masses or hepatosplenomegaly appreciated.   ?Extremities: Grossly normal range of motion.  Warm, well perfused.  No edema bilaterally. ?Skin: No rashes or lesions noted. ? ?Laboratory & Radiologic Studies: ?CBC ?   ?Component Value Date/Time  ? WBC 11.1 (H) 05/06/2021 0756  ? RBC 3.19 (L) 05/06/2021 0756  ? HGB 9.3 (L) 05/06/2021 0756  ? HGB 10.5 (L) 03/29/2021 1210  ? HGB 9.3 (L) 11/11/2020 1545  ? HCT 29.7 (L) 05/06/2021 0756  ? HCT 28.5 (L) 11/11/2020 1545  ? PLT 437 (H) 05/06/2021 0756  ? PLT 438 (H) 03/29/2021 1210  ? PLT 430 11/11/2020 1545  ? MCV 93.1 05/06/2021 0756  ? MCV 87 11/11/2020 1545  ? MCH 29.2 05/06/2021 0756  ? MCHC 31.3 05/06/2021 0756  ? RDW 20.0 (H) 05/06/2021 0756  ? RDW 19.1 (H) 11/11/2020 1545  ? LYMPHSABS 1.7 05/06/2021 0756  ? LYMPHSABS 1.9 11/11/2020 1545  ? MONOABS 0.8 05/06/2021 0756  ? EOSABS 0.0 05/06/2021 0756  ? EOSABS 0.1 11/11/2020 1545  ? BASOSABS 0.1 05/06/2021 0756  ? BASOSABS 0.0 11/11/2020 1545  ? ?BMP Latest Ref Rng & Units 05/06/2021 03/29/2021 10/27/2020  ?Glucose 70 - 99 mg/dL 145(H) 91 358(H)  ?BUN 6 - 20 mg/dL 11 20 9   ?Creatinine 0.44 - 1.00 mg/dL 0.54 0.72 0.70  ?Sodium 135 - 145 mmol/L 135 139 136  ?Potassium 3.5 - 5.1 mmol/L 3.6 3.7 3.6  ?Chloride 98 - 111 mmol/L 101 102 99  ?CO2 22 - 32 mmol/L 24 27 25   ?Calcium 8.9 - 10.3 mg/dL 9.1 10.0 9.2  ? ?Assessment & Plan: ?Crystal Vasquez is a 51 y.o. woman with symptomatic prolapsing fibroid. S/p recent [place] in preparation of definitive surgery today.  ?  ?Patient would like to move forward with scheduling surgery.  She and I have previously discussed in detail what this would involve.  Given the size of her prolapsing  ? ?Jeral Pinch, MD  ?Division of Gynecologic Oncology  ?Department of Obstetrics and Gynecology  ?[facility]  ? ?

## 2021-05-18 NOTE — Discharge Instructions (Signed)

## 2021-05-18 NOTE — Anesthesia Procedure Notes (Signed)
Procedure Name: Intubation ?Date/Time: 05/18/2021 2:13 PM ?Performed by: Milford Cage, CRNA ?Pre-anesthesia Checklist: Patient identified, Emergency Drugs available, Suction available and Patient being monitored ?Patient Re-evaluated:Patient Re-evaluated prior to induction ?Oxygen Delivery Method: Circle system utilized ?Preoxygenation: Pre-oxygenation with 100% oxygen ?Induction Type: IV induction ?Ventilation: Mask ventilation without difficulty ?Laryngoscope Size: Sabra Heck and 2 ?Grade View: Grade II ?Tube type: Oral ?Tube size: 7.0 mm ?Number of attempts: 1 ?Airway Equipment and Method: Stylet ?Placement Confirmation: ETT inserted through vocal cords under direct vision, positive ETCO2 and breath sounds checked- equal and bilateral ?Secured at: 21 cm ?Tube secured with: Tape ?Dental Injury: Teeth and Oropharynx as per pre-operative assessment  ? ? ? ? ?

## 2021-05-18 NOTE — Brief Op Note (Signed)
05/18/2021 ? ?4:32 PM ? ?PATIENT:  Beautifull A Lembcke  51 y.o. female ? ?PRE-OPERATIVE DIAGNOSIS:  PROLAPSIING UTERINE MASS ? ?POST-OPERATIVE DIAGNOSIS:  prolapsing uterine mass ? ?PROCEDURE:  Procedure(s): ?XI ROBOTIC ASSISTED LAPAROSCOPIC HYSTERECTOMY AND SALPINGECTOMY, UTERUS MORE THAN 250 GRAMS (Bilateral) ? ?SURGEON:  Surgeon(s) and Role: ?   Lafonda Mosses, MD - Primary ?   Lahoma Crocker, MD - Assisting ? ?EBL:  100 mL  ? ?BLOOD ADMINISTERED:none ? ?DRAINS: none  ? ?LOCAL MEDICATIONS USED:  MARCAINE    ? ?SPECIMEN:  uterus, cervix, bilateral tubes ? ?DISPOSITION OF SPECIMEN:  PATHOLOGY ? ?COUNTS:  YES ? ?TOURNIQUET:  * No tourniquets in log * ? ?DICTATION: .Note written in EPIC ? ?PLAN OF CARE: Discharge to home after PACU ? ?PATIENT DISPOSITION:  PACU - hemodynamically stable. ?  ?Delay start of Pharmacological VTE agent (>24hrs) due to surgical blood loss or risk of bleeding: not applicable ? ?

## 2021-05-18 NOTE — Anesthesia Preprocedure Evaluation (Addendum)
Anesthesia Evaluation  ?Patient identified by MRN, date of birth, ID band ?Patient awake ? ? ? ?Reviewed: ?Allergy & Precautions, NPO status , Patient's Chart, lab work & pertinent test results ? ?Airway ?Mallampati: I ? ?TM Distance: >3 FB ?Neck ROM: Full ? ? ? Dental ? ?(+) Dental Advisory Given, Chipped,  ?  ?Pulmonary ?asthma ,  ?  ?Pulmonary exam normal ?breath sounds clear to auscultation ? ? ? ? ? ? Cardiovascular ?hypertension, Pt. on medications and Pt. on home beta blockers ?Normal cardiovascular exam ?Rhythm:Regular Rate:Normal ? ? ?  ?Neuro/Psych ? Headaches, PSYCHIATRIC DISORDERS Depression   ? GI/Hepatic ?Neg liver ROS, GERD  Controlled and Medicated,  ?Endo/Other  ?diabetes, Type 2, Oral Hypoglycemic Agents ? Renal/GU ?negative Renal ROS  ?negative genitourinary ?  ?Musculoskeletal ?negative musculoskeletal ROS ?(+)  ? Abdominal ?  ?Peds ? Hematology ? ?(+) Blood dyscrasia (Hgb 9.3), anemia ,   ?Anesthesia Other Findings ? ? Reproductive/Obstetrics ? ?  ? ? ? ? ? ? ? ? ? ? ? ? ? ?  ?  ? ? ? ? ? ? ?Anesthesia Physical ?Anesthesia Plan ? ?ASA: 3 ? ?Anesthesia Plan: General  ? ?Post-op Pain Management: Tylenol PO (pre-op)*, Ketamine IV* and Lidocaine infusion*  ? ?Induction: Intravenous ? ?PONV Risk Score and Plan: 3 and Midazolam, Dexamethasone and Ondansetron ? ?Airway Management Planned: Oral ETT ? ?Additional Equipment:  ? ?Intra-op Plan:  ? ?Post-operative Plan: Extubation in OR ? ?Informed Consent: I have reviewed the patients History and Physical, chart, labs and discussed the procedure including the risks, benefits and alternatives for the proposed anesthesia with the patient or authorized representative who has indicated his/her understanding and acceptance.  ? ? ? ?Dental advisory given ? ?Plan Discussed with: CRNA ? ?Anesthesia Plan Comments:   ? ? ? ? ? ? ?Anesthesia Quick Evaluation ? ?

## 2021-05-18 NOTE — Op Note (Addendum)
OPERATIVE NOTE ? ?Pre-operative Diagnosis: Prolapsing endometrial vs endocervical fibroid, AUB ? ?Post-operative Diagnosis: same, necrotic prolapsing fibroid after recent embolization ? ?Operation: Robotic-assisted laparoscopic total hysterectomy with bilateral salpingectomy, contained morcellation given size of uterus ? ?Surgeon: Jeral Pinch MD ? ?Assistant Surgeon: Lahoma Crocker MD (an MD assistant was necessary for tissue manipulation, management of robotic instrumentation, retraction and positioning due to the complexity of the case and hospital policies).  ? ?Anesthesia: GET ? ?Urine Output: 900cc ? ?Operative Findings: On EUA, necrotic deflated fibroid prolapsed into the vagina on a 2 cm stalk, foul smelling, minimal purulent discharge. Cervix enlarged, 5 cm, dilated approximately 1-2 cm. Enlarged uterus measuring 12 cm. On intra-abdominal entry, normal upper abdominal survey. Normal omentum, small and large bowel. Bulbous enlarged uterus, 12-14cm. Normal bilateral tubes and ovaries.  ? ?Estimated Blood Loss:  100cc     ? ?Total IV Fluids: see I&O flowsheet ?        ?Specimens: uterus, cervix, bilateral tubes ?        ?Complications:  None apparent; patient tolerated the procedure well. ?        ?Disposition: PACU - hemodynamically stable. ? ?Procedure Details  ?The patient was seen in the Holding Room. The risks, benefits, complications, treatment options, and expected outcomes were discussed with the patient.  The patient concurred with the proposed plan, giving informed consent.  The site of surgery properly noted/marked. The patient was identified as Crystal Vasquez and the procedure verified as a Robotic-assisted hysterectomy with bilateral salpingectomy.  ? ?After induction of anesthesia, the patient was draped and prepped in the usual sterile manner. Patient was placed in supine position after anesthesia and draped and prepped in the usual sterile manner as follows: Her arms were tucked to  her side with all appropriate precautions.  The shoulders were stabilized with padded shoulder blocks applied to the acromium processes.  The patient was placed in the semi-lithotomy position in [place].  The perineum and vagina were prepped with CholoraPrep. The patient was draped after the CholoraPrep had been allowed to dry for 3 minutes.  A Time Out was held and the above information confirmed. ? ?The urethra was prepped with Betadine. Foley catheter was placed.  A sterile speculum was placed in the vagina.  The prolapsing and necrotic 6-8 cm deflated fibroid was removed by twisting it on its stalk with ringed forceps. The cervix was grasped with a single-tooth tenaculum. The 4.0 Delineator uterine manipulator with a colpotomizer ring was placed without difficulty.  A pneum occluder balloon was placed over the manipulator.  OG tube placement was confirmed and to suction.  ? ?Next, a 10 mm skin incision was made 1 cm below the subcostal margin in the midclavicular line.  The 5 mm Optiview port and scope was used for direct entry.  Opening pressure was under 10 mm CO2.  The abdomen was insufflated and the findings were noted as above.   At this point and all points during the procedure, the patient's intra-abdominal pressure did not exceed 15 mmHg. Next, an 8 mm skin incision was made superior to the umbilicus and a right and left port were placed about 8 cm lateral to the robot port on the right and left side.  A fourth arm was placed on the right.  The 5 mm assist trocar was exchanged for a 10-12 mm port. All ports were placed under direct visualization.  The patient was placed in steep Trendelenburg.  Bowel was folded away into  the upper abdomen.  The robot was docked in the normal manner. ? ?The right and left peritoneum were opened parallel to the IP ligament to open the retroperitoneal spaces bilaterally. The round ligaments were transected. The ureter was again noted to be on the medial leaf of the  broad ligament.  The fallopian tube was elevated and electrocautery was used to cauterize and then transect just inferior to the fallopian tube, free it from the ovary. The utero-ovarian ligament was then skeletonized, cauterized and cut.   ? ?The posterior peritoneum was taken down to the level of the KOH ring.  The anterior peritoneum was also taken down.  The bladder flap was created to the level of the KOH ring.  The uterine artery on the right side was skeletonized, cauterized and cut in the normal manner.  A similar procedure was performed on the left.  The colpotomy was made and the uterus, cervix, bilateral tubes were amputated and placed in an Endocatch bag. The bag was brought out through the vagina and the uterus was morcellated. Additional margin of the cervix was excised from the posterior portion of the cuff.  Pedicles were inspected and excellent hemostasis was achieved.   ? ?The colpotomy at the vaginal cuff was closed with Vicryl on a CT1 needle in running manner.  Given edematous tissue, a second layer was used to oversew the cuff and close the peritoneum. Irrigation was used and excellent hemostasis was achieved.  At this point in the procedure was completed.  Robotic instruments were removed under direct visulaization.  The robot was undocked. The fascia at the 10-12 mm port was closed with 0 Vicryl on a UR-5 needle.  The subcuticular tissue was closed with 4-0 Vicryl and the skin was closed with 4-0 Monocryl in a subcuticular manner.  Dermabond was applied.   ? ?The vagina was swabbed with  minimal bleeding noted. Foley catheter was removed.  All sponge, lap and needle counts were correct x  3.  ? ?The patient was transferred to the recovery room in stable condition. ? ?Jeral Pinch, MD  ?

## 2021-05-18 NOTE — Transfer of Care (Signed)
Immediate Anesthesia Transfer of Care Note ? ?Patient: Crystal Vasquez ? ?Procedure(s) Performed: XI ROBOTIC ASSISTED LAPAROSCOPIC HYSTERECTOMY AND SALPINGECTOMY, UTERUS MORE THAN 250 GRAMS (Bilateral) ? ?Patient Location: PACU ? ?Anesthesia Type:General ? ?Level of Consciousness: drowsy ? ?Airway & Oxygen Therapy: Patient Spontanous Breathing and Patient connected to face mask oxygen ? ?Post-op Assessment: Report given to RN and Post -op Vital signs reviewed and stable ? ?Post vital signs: Reviewed and stable ? ?Last Vitals:  ?Vitals Value Taken Time  ?BP 142/76 05/18/21 1652  ?Temp    ?Pulse 64 05/18/21 1653  ?Resp 16 05/18/21 1653  ?SpO2 98 % 05/18/21 1653  ?Vitals shown include unvalidated device data. ? ?Last Pain:  ?Vitals:  ? 05/18/21 1238  ?TempSrc: Oral  ?PainSc: 4   ?   ? ?Patients Stated Pain Goal: 4 (05/18/21 1238) ? ?Complications: No notable events documented. ?

## 2021-05-19 ENCOUNTER — Inpatient Hospital Stay (HOSPITAL_COMMUNITY)
Admission: EM | Admit: 2021-05-19 | Discharge: 2021-05-23 | DRG: 864 | Disposition: A | Discharge status: Home / Self Care | Payer: Self-pay | Attending: Gynecologic Oncology | Admitting: Gynecologic Oncology

## 2021-05-19 ENCOUNTER — Encounter (HOSPITAL_COMMUNITY): Payer: Self-pay | Admitting: Gynecologic Oncology

## 2021-05-19 ENCOUNTER — Other Ambulatory Visit: Payer: Self-pay

## 2021-05-19 ENCOUNTER — Telehealth: Payer: Self-pay

## 2021-05-19 ENCOUNTER — Emergency Department (HOSPITAL_COMMUNITY): Payer: Self-pay

## 2021-05-19 DIAGNOSIS — R7989 Other specified abnormal findings of blood chemistry: Secondary | ICD-10-CM

## 2021-05-19 DIAGNOSIS — Z7984 Long term (current) use of oral hypoglycemic drugs: Secondary | ICD-10-CM

## 2021-05-19 DIAGNOSIS — R7309 Other abnormal glucose: Secondary | ICD-10-CM

## 2021-05-19 DIAGNOSIS — J309 Allergic rhinitis, unspecified: Secondary | ICD-10-CM

## 2021-05-19 DIAGNOSIS — Z833 Family history of diabetes mellitus: Secondary | ICD-10-CM

## 2021-05-19 DIAGNOSIS — Z8616 Personal history of COVID-19: Secondary | ICD-10-CM

## 2021-05-19 DIAGNOSIS — R5082 Postprocedural fever: Principal | ICD-10-CM | POA: Diagnosis present

## 2021-05-19 DIAGNOSIS — Z79899 Other long term (current) drug therapy: Secondary | ICD-10-CM

## 2021-05-19 DIAGNOSIS — I10 Essential (primary) hypertension: Secondary | ICD-10-CM | POA: Diagnosis present

## 2021-05-19 DIAGNOSIS — J302 Other seasonal allergic rhinitis: Secondary | ICD-10-CM

## 2021-05-19 DIAGNOSIS — Z79890 Hormone replacement therapy (postmenopausal): Secondary | ICD-10-CM

## 2021-05-19 DIAGNOSIS — Z8249 Family history of ischemic heart disease and other diseases of the circulatory system: Secondary | ICD-10-CM

## 2021-05-19 DIAGNOSIS — N939 Abnormal uterine and vaginal bleeding, unspecified: Secondary | ICD-10-CM

## 2021-05-19 DIAGNOSIS — G8918 Other acute postprocedural pain: Secondary | ICD-10-CM

## 2021-05-19 DIAGNOSIS — Z8049 Family history of malignant neoplasm of other genital organs: Secondary | ICD-10-CM

## 2021-05-19 DIAGNOSIS — E871 Hypo-osmolality and hyponatremia: Secondary | ICD-10-CM | POA: Diagnosis present

## 2021-05-19 DIAGNOSIS — D500 Iron deficiency anemia secondary to blood loss (chronic): Secondary | ICD-10-CM | POA: Diagnosis present

## 2021-05-19 DIAGNOSIS — E1165 Type 2 diabetes mellitus with hyperglycemia: Secondary | ICD-10-CM | POA: Diagnosis present

## 2021-05-19 DIAGNOSIS — E872 Acidosis, unspecified: Secondary | ICD-10-CM | POA: Diagnosis present

## 2021-05-19 DIAGNOSIS — Z90710 Acquired absence of both cervix and uterus: Secondary | ICD-10-CM

## 2021-05-19 DIAGNOSIS — K219 Gastro-esophageal reflux disease without esophagitis: Secondary | ICD-10-CM | POA: Diagnosis present

## 2021-05-19 DIAGNOSIS — R509 Fever, unspecified: Secondary | ICD-10-CM

## 2021-05-19 DIAGNOSIS — D649 Anemia, unspecified: Secondary | ICD-10-CM

## 2021-05-19 DIAGNOSIS — Z9189 Other specified personal risk factors, not elsewhere classified: Secondary | ICD-10-CM

## 2021-05-19 DIAGNOSIS — E1159 Type 2 diabetes mellitus with other circulatory complications: Secondary | ICD-10-CM | POA: Diagnosis present

## 2021-05-19 LAB — URINALYSIS, ROUTINE W REFLEX MICROSCOPIC
Bacteria, UA: NONE SEEN
Bilirubin Urine: NEGATIVE
Glucose, UA: 150 mg/dL — AB
Ketones, ur: NEGATIVE mg/dL
Nitrite: NEGATIVE
Protein, ur: NEGATIVE mg/dL
Specific Gravity, Urine: 1.012 (ref 1.005–1.030)
pH: 6 (ref 5.0–8.0)

## 2021-05-19 LAB — COMPREHENSIVE METABOLIC PANEL
ALT: 15 U/L (ref 0–44)
AST: 18 U/L (ref 15–41)
Albumin: 3.2 g/dL — ABNORMAL LOW (ref 3.5–5.0)
Alkaline Phosphatase: 55 U/L (ref 38–126)
Anion gap: 14 (ref 5–15)
BUN: 10 mg/dL (ref 6–20)
CO2: 18 mmol/L — ABNORMAL LOW (ref 22–32)
Calcium: 8.2 mg/dL — ABNORMAL LOW (ref 8.9–10.3)
Chloride: 98 mmol/L (ref 98–111)
Creatinine, Ser: 0.84 mg/dL (ref 0.44–1.00)
GFR, Estimated: >60 mL/min (ref >60)
Glucose, Bld: 344 mg/dL — ABNORMAL HIGH (ref 70–99)
Potassium: 3 mmol/L — ABNORMAL LOW (ref 3.5–5.1)
Sodium: 130 mmol/L — ABNORMAL LOW (ref 135–145)
Total Bilirubin: <0.1 mg/dL — ABNORMAL LOW (ref 0.3–1.2)
Total Protein: 6.6 g/dL (ref 6.5–8.1)

## 2021-05-19 LAB — CBC WITH DIFFERENTIAL/PLATELET
Abs Immature Granulocytes: 0.04 10*3/uL (ref 0.00–0.07)
Basophils Absolute: 0 10*3/uL (ref 0.0–0.1)
Basophils Relative: 0 %
Eosinophils Absolute: 0 10*3/uL (ref 0.0–0.5)
Eosinophils Relative: 0 %
HCT: 30.7 % — ABNORMAL LOW (ref 36.0–46.0)
Hemoglobin: 9.3 g/dL — ABNORMAL LOW (ref 12.0–15.0)
Immature Granulocytes: 1 %
Lymphocytes Relative: 6 %
Lymphs Abs: 0.6 10*3/uL — ABNORMAL LOW (ref 0.7–4.0)
MCH: 28.9 pg (ref 26.0–34.0)
MCHC: 30.3 g/dL (ref 30.0–36.0)
MCV: 95.3 fL (ref 80.0–100.0)
Monocytes Absolute: 0.4 10*3/uL (ref 0.1–1.0)
Monocytes Relative: 5 %
Neutro Abs: 7.7 10*3/uL (ref 1.7–7.7)
Neutrophils Relative %: 88 %
Platelets: 444 10*3/uL — ABNORMAL HIGH (ref 150–400)
RBC: 3.22 MIL/uL — ABNORMAL LOW (ref 3.87–5.11)
RDW: 19.7 % — ABNORMAL HIGH (ref 11.5–15.5)
WBC: 8.7 10*3/uL (ref 4.0–10.5)
nRBC: 0 % (ref 0.0–0.2)

## 2021-05-19 LAB — BASIC METABOLIC PANEL
Anion gap: 12 (ref 5–15)
BUN: 9 mg/dL (ref 6–20)
CO2: 21 mmol/L — ABNORMAL LOW (ref 22–32)
Calcium: 7.9 mg/dL — ABNORMAL LOW (ref 8.9–10.3)
Chloride: 101 mmol/L (ref 98–111)
Creatinine, Ser: 0.72 mg/dL (ref 0.44–1.00)
GFR, Estimated: >60 mL/min (ref >60)
Glucose, Bld: 324 mg/dL — ABNORMAL HIGH (ref 70–99)
Potassium: 4.2 mmol/L (ref 3.5–5.1)
Sodium: 134 mmol/L — ABNORMAL LOW (ref 135–145)

## 2021-05-19 LAB — LACTIC ACID, PLASMA
Lactic Acid, Venous: 3.2 mmol/L (ref 0.5–1.9)
Lactic Acid, Venous: 2.6 mmol/L (ref 0.5–1.9)

## 2021-05-19 LAB — GLUCOSE, CAPILLARY
Glucose-Capillary: 308 mg/dL — ABNORMAL HIGH (ref 70–99)
Glucose-Capillary: 228 mg/dL — ABNORMAL HIGH (ref 70–99)

## 2021-05-19 MED ORDER — PANTOPRAZOLE SODIUM 40 MG PO TBEC
40.0000 mg | DELAYED_RELEASE_TABLET | Freq: Every day | ORAL | Status: DC
Start: 2021-05-19 — End: 2021-05-23
  Administered 2021-05-19 – 2021-05-23 (×5): 40 mg via ORAL
  Filled 2021-05-19 (×5): qty 1

## 2021-05-19 MED ORDER — METOPROLOL SUCCINATE ER 25 MG PO TB24
25.0000 mg | ORAL_TABLET | Freq: Every day | ORAL | Status: DC
  Administered 2021-05-19 – 2021-05-23 (×5): 25 mg via ORAL
  Filled 2021-05-19 (×5): qty 1

## 2021-05-19 MED ORDER — AMLODIPINE BESYLATE 10 MG PO TABS
10.0000 mg | ORAL_TABLET | Freq: Every day | ORAL | Status: DC
  Administered 2021-05-19 – 2021-05-23 (×5): 10 mg via ORAL
  Filled 2021-05-19 (×5): qty 1

## 2021-05-19 MED ORDER — SODIUM CHLORIDE 0.9 % IV BOLUS: 1000.0000 mL | Freq: Once | INTRAVENOUS | Status: DC

## 2021-05-19 MED ORDER — INSULIN ASPART 100 UNIT/ML IJ SOLN
0.0000 [IU] | Freq: Three times a day (TID) | INTRAMUSCULAR | Status: DC
  Administered 2021-05-19: 11 [IU] via SUBCUTANEOUS
  Administered 2021-05-20: 5 [IU] via SUBCUTANEOUS
  Administered 2021-05-20: 3 [IU] via SUBCUTANEOUS
  Administered 2021-05-20 – 2021-05-21 (×3): 5 [IU] via SUBCUTANEOUS
  Administered 2021-05-21: 8 [IU] via SUBCUTANEOUS
  Administered 2021-05-22 (×2): 3 [IU] via SUBCUTANEOUS
  Administered 2021-05-22: 8 [IU] via SUBCUTANEOUS
  Administered 2021-05-23: 3 [IU] via SUBCUTANEOUS

## 2021-05-19 MED ORDER — ONDANSETRON HCL 4 MG/2ML IJ SOLN
4.0000 mg | Freq: Four times a day (QID) | INTRAMUSCULAR | Status: DC | PRN
  Administered 2021-05-21: 4 mg via INTRAVENOUS
  Filled 2021-05-19: qty 2

## 2021-05-19 MED ORDER — IBUPROFEN 400 MG PO TABS
600.0000 mg | ORAL_TABLET | Freq: Four times a day (QID) | ORAL | Status: DC | PRN
  Administered 2021-05-19 – 2021-05-21 (×4): 600 mg via ORAL
  Filled 2021-05-19 (×4): qty 1

## 2021-05-19 MED ORDER — METFORMIN HCL 500 MG PO TABS
1000.0000 mg | ORAL_TABLET | Freq: Two times a day (BID) | ORAL | Status: DC
  Administered 2021-05-19 – 2021-05-20 (×2): 1000 mg via ORAL
  Filled 2021-05-19 (×2): qty 2

## 2021-05-19 MED ORDER — SODIUM CHLORIDE 0.9 % IV BOLUS
1000.0000 mL | Freq: Once | INTRAVENOUS | Status: AC
  Administered 2021-05-19: 1000 mL via INTRAVENOUS

## 2021-05-19 MED ORDER — POTASSIUM CHLORIDE IN NACL 20-0.45 MEQ/L-% IV SOLN
INTRAVENOUS | Status: DC
  Filled 2021-05-19 (×3): qty 1000

## 2021-05-19 MED ORDER — POTASSIUM CHLORIDE CRYS ER 20 MEQ PO TBCR
40.0000 meq | EXTENDED_RELEASE_TABLET | Freq: Once | ORAL | Status: AC
  Administered 2021-05-19: 40 meq via ORAL
  Filled 2021-05-19: qty 2

## 2021-05-19 MED ORDER — TRAMADOL HCL 50 MG PO TABS: 50.0000 mg | ORAL_TABLET | Freq: Four times a day (QID) | ORAL | Status: DC | PRN

## 2021-05-19 MED ORDER — SENNOSIDES-DOCUSATE SODIUM 8.6-50 MG PO TABS
2.0000 | ORAL_TABLET | Freq: Every day | ORAL | Status: DC
  Administered 2021-05-19 – 2021-05-22 (×4): 2 via ORAL
  Filled 2021-05-19 (×4): qty 2

## 2021-05-19 MED ORDER — FLUTICASONE FUROATE 27.5 MCG/SPRAY NA SUSP: 1.0000 | Freq: Every morning | NASAL | Status: DC

## 2021-05-19 MED ORDER — PIPERACILLIN-TAZOBACTAM 3.375 G IVPB 30 MIN
3.3750 g | Freq: Once | INTRAVENOUS | Status: AC
  Administered 2021-05-19: 3.375 g via INTRAVENOUS
  Filled 2021-05-19 (×2): qty 50

## 2021-05-19 MED ORDER — PIPERACILLIN-TAZOBACTAM 3.375 G IVPB
3.3750 g | Freq: Three times a day (TID) | INTRAVENOUS | Status: DC
  Administered 2021-05-19 – 2021-05-22 (×8): 3.375 g via INTRAVENOUS
  Filled 2021-05-19 (×8): qty 50

## 2021-05-19 MED ORDER — ENOXAPARIN SODIUM 40 MG/0.4ML IJ SOSY
40.0000 mg | PREFILLED_SYRINGE | INTRAMUSCULAR | Status: DC
  Administered 2021-05-19 – 2021-05-22 (×4): 40 mg via SUBCUTANEOUS
  Filled 2021-05-19 (×4): qty 0.4

## 2021-05-19 MED ORDER — ONDANSETRON HCL 4 MG PO TABS: 4.0000 mg | ORAL_TABLET | Freq: Four times a day (QID) | ORAL | Status: DC | PRN

## 2021-05-19 MED ORDER — INSULIN ASPART 100 UNIT/ML IJ SOLN
0.0000 [IU] | Freq: Every day | INTRAMUSCULAR | Status: DC
  Administered 2021-05-19: 2 [IU] via SUBCUTANEOUS

## 2021-05-19 MED ORDER — FLUTICASONE PROPIONATE 50 MCG/ACT NA SUSP
1.0000 | Freq: Every day | NASAL | Status: DC
  Administered 2021-05-20 – 2021-05-23 (×4): 1 via NASAL
  Filled 2021-05-19: qty 16

## 2021-05-19 MED ORDER — OXYCODONE HCL 5 MG PO TABS: 5.0000 mg | ORAL_TABLET | Freq: Four times a day (QID) | ORAL | Status: DC | PRN

## 2021-05-19 MED ORDER — OMEPRAZOLE MAGNESIUM 20 MG PO TBEC: 20.0000 mg | DELAYED_RELEASE_TABLET | Freq: Every morning | ORAL | Status: DC

## 2021-05-19 NOTE — Progress Notes (Signed)
Pharmacy Antibiotic Note ? ?Crystal Vasquez is a 51 y.o. female admitted on 05/19/2021 with sepsis.  Pharmacy has been consulted for Zosyn dosing. ? ?Pt with fever post-operatively, necrotic fibroid at time of hysterectomy yesterday s/p uterine artery embolization on 2/20, elevated lactic acid, urine samples/blood cultures drawn today ? ?Plan: ?Zosyn 3.375g IV q8h (4 hour infusion). ? ?Height: 5\' 6"  (167.6 cm) ?Weight: 79.4 kg (175 lb) ?IBW/kg (Calculated) : 59.3 ? ?Temp (24hrs), Avg:98.2 ?F (36.8 ?C), Min:98 ?F (36.7 ?C), Max:98.5 ?F (36.9 ?C) ? ?Recent Labs  ?Lab 05/19/21 ?1037 05/19/21 ?1102 05/19/21 ?1323  ?WBC 8.7  --   --   ?CREATININE 0.84  --   --   ?LATICACIDVEN  --  3.2* 2.6*  ?  ?Estimated Creatinine Clearance: 84.2 mL/min (by C-G formula based on SCr of 0.84 mg/dL).   ? ?No Known Allergies ? ?Antimicrobials this admission: ?3/2  Zosyn >>  ?  ? ?Dose adjustments this admission: ? ? ?Microbiology results: ?3/2 BCx:  ?  ? ?Thank you for allowing pharmacy to be a part of this patient?s care. ? ?Royetta Asal, PharmD, BCPS ?05/19/2021 3:42 PM ? ? ?

## 2021-05-19 NOTE — ED Provider Notes (Signed)
[facility] DEPT Provider Note   CSN: [id number] Arrival date & time: 05/19/21  3016     History  Chief Complaint  Patient presents with   Post-op Problem   Fever   Nausea    Crystal Vasquez is a 51 y.o. female.  51 year old female presents with concern for fever with oral temperature at home of 104 today, took 650 mg of Tylenol 1 hour prior to arrival.  Patient reports laparoscopic hysterectomy yesterday, no complications, was awake throughout the night watching TV, eating, drinking.  Woke up today with chills and sweats which prompted her to check her temperature.  She denies chest pain, shortness of breath, dysuria, abdominal pain or any other complaints or concerns.  States her pain has been generally well controlled and has not had much pain, does have a prescription for oxycodone at home, has not taken since last night. Patient did have a positive COVID test 10 days ago prior to her surgery, states that she took for at home tests which were all negative, denies COVID-like illness in the past 90 days.      Home Medications Prior to Admission medications   Medication Sig Start Date End Date Taking? Authorizing Provider  amLODipine (NORVASC) 10 MG tablet Take 1 tablet (10 mg total) by mouth daily. 02/18/21  Yes Dorna Mai, MD  docusate sodium (COLACE) 100 MG capsule Take 1 capsule (100 mg total) by mouth 2 (two) times daily for 14 days. 05/10/21 05/24/21 Yes Covington, Roselyn Reef R, NP  ferrous sulfate 325 (65 FE) MG tablet Take 325 mg by mouth in the morning.   Yes [provider]  fluticasone (VERAMYST) 27.5 MCG/SPRAY nasal spray Place 1 spray into the nose in the morning.   Yes [provider]  glipiZIDE (GLUCOTROL) 10 MG tablet Take 1 tablet (10 mg total) by mouth 2 (two) times daily before a meal. 02/18/21  Yes Dorna Mai, MD  hydrochlorothiazide (HYDRODIURIL) 25 MG tablet Take 1 tablet (25 mg total) by mouth daily. 02/18/21   Yes Dorna Mai, MD  lisinopril (ZESTRIL) 40 MG tablet Take 1 tablet (40 mg total) by mouth daily. 02/18/21  Yes Dorna Mai, MD  medroxyPROGESTERone (PROVERA) 10 MG tablet Take 1 tablet (10 mg total) by mouth daily. 05/05/21  Yes Cross, Lenna Sciara D, NP  metFORMIN (GLUCOPHAGE) 1000 MG tablet Take 1 tablet (1,000 mg total) by mouth 2 (two) times daily with a meal. 02/18/21  Yes Dorna Mai, MD  metoprolol succinate (TOPROL-XL) 25 MG 24 hr tablet Take 1 tablet (25 mg total) by mouth daily. 02/18/21  Yes Dorna Mai, MD  omeprazole (PRILOSEC OTC) 20 MG tablet Take 20 mg by mouth every morning.   Yes [provider]  acetaminophen (TYLENOL) 325 MG tablet Take 650 mg by mouth every 6 (six) hours as needed for moderate pain.    [provider]  senna-docusate (SENOKOT-S) 8.6-50 MG tablet Take 2 tablets by mouth at bedtime. For AFTER surgery, do not take if having diarrhea 05/05/21   Joylene John D, NP  traMADol (ULTRAM) 50 MG tablet Take 1 tablet (50 mg total) by mouth every 6 (six) hours as needed for severe pain. For AFTER surgery only, do not take and drive 0/10/93   Joylene John D, NP      Allergies    Patient has no known allergies.    Review of Systems   Review of Systems Negative except as per HPI Physical Exam Updated Vital Signs BP (!) 129/58 (  BP Location: Right Arm)    Pulse 86    Temp 98.5 F (36.9 C) (Oral)    Resp 16    Ht 5\' 6"  (1.676 m)    Wt 79.4 kg    LMP 04/27/2021 (Exact Date)    SpO2 95%    BMI 28.25 kg/m  Physical Exam Vitals and nursing note reviewed.  Constitutional:      General: She is not in acute distress.    Appearance: She is well-developed. She is not diaphoretic.  HENT:     Head: Normocephalic and atraumatic.     Mouth/Throat:     Mouth: Mucous membranes are moist.  Eyes:     Conjunctiva/sclera: Conjunctivae normal.  Cardiovascular:     Rate and Rhythm: Normal rate and regular rhythm.     Heart sounds: Normal heart sounds.   Pulmonary:     Effort: Pulmonary effort is normal.     Breath sounds: Normal breath sounds.  Abdominal:     Palpations: Abdomen is soft.     Tenderness: There is no abdominal tenderness.  Musculoskeletal:     Cervical back: Neck supple.     Right lower leg: No edema.     Left lower leg: No edema.  Skin:    General: Skin is warm and dry.     Findings: No erythema or rash.     Comments: Incision sites appear to be healing well, no notable erythema, no drainage  Neurological:     Mental Status: She is alert and oriented to person, place, and time.  Psychiatric:        Behavior: Behavior normal.    ED Results / Procedures / Treatments   Labs (all labs ordered are listed, but only abnormal results are displayed) Labs Reviewed  CBC WITH DIFFERENTIAL/PLATELET - Abnormal; Notable for the following components:      Result Value   RBC 3.22 (*)    Hemoglobin 9.3 (*)    HCT 30.7 (*)    RDW 19.7 (*)    Platelets 444 (*)    Lymphs Abs 0.6 (*)    All other components within normal limits  COMPREHENSIVE METABOLIC PANEL - Abnormal; Notable for the following components:   Sodium 130 (*)    Potassium 3.0 (*)    CO2 18 (*)    Glucose, Bld 344 (*)    Calcium 8.2 (*)    Albumin 3.2 (*)    Total Bilirubin <0.1 (*)    All other components within normal limits  LACTIC ACID, PLASMA - Abnormal; Notable for the following components:   Lactic Acid, Venous 3.2 (*)    All other components within normal limits  CULTURE, BLOOD (ROUTINE X 2)  CULTURE, BLOOD (ROUTINE X 2)  URINALYSIS, ROUTINE W REFLEX MICROSCOPIC  LACTIC ACID, PLASMA    EKG None  Radiology DG Chest 2 View  Result Date: 05/19/2021 CLINICAL DATA:  Fever status post hysterectomy. EXAM: CHEST - 2 VIEW COMPARISON:  None. FINDINGS: The heart size and mediastinal contours are within normal limits. Both lungs are clear. The visualized skeletal structures are unremarkable. IMPRESSION: No active cardiopulmonary disease. Electronically  Signed   By: Kerby Moors M.D.   On: 05/19/2021 11:34    Procedures Procedures    Medications Ordered in ED Medications  sodium chloride 0.9 % bolus 1,000 mL (has no administration in time range)  enoxaparin (LOVENOX) injection 40 mg (has no administration in time range)  0.45 % NaCl with KCl 20 mEq / L  infusion (has no administration in time range)  ibuprofen (ADVIL) tablet 600 mg (has no administration in time range)  traMADol (ULTRAM) tablet 50 mg (has no administration in time range)  ondansetron (ZOFRAN) tablet 4 mg (has no administration in time range)    Or  ondansetron (ZOFRAN) injection 4 mg (has no administration in time range)  sodium chloride 0.9 % bolus 1,000 mL (0 mLs Intravenous Stopped 05/19/21 1239)  potassium chloride SA (KLOR-CON M) CR tablet 40 mEq (40 mEq Oral Given 05/19/21 1141)    ED Course/ Medical Decision Making/ A&P                           Medical Decision Making Amount and/or Complexity of Data Reviewed Labs: ordered. Radiology: ordered.  Risk Prescription drug management.   This patient presents to the ED for concern of fever postop, this involves an extensive number of treatment options, and is a complaint that carries with it a high risk of complications and morbidity.  The differential diagnosis includes but not limited to pneumonia, atelectasis, urinary tract infection, postop infection, PE, viral illness such as COVID   Co morbidities that complicate the patient evaluation  Diabetes, thyroid disease, hypertension   Additional history obtained:  External records from outside source obtained and reviewed including GYN H&P from 05/18/2021 preop Recent COVID + 10 days ago, reports 4 negative home tests   Lab Tests:  I Ordered, and personally interpreted labs.  The pertinent results include: CBC with baseline anemia with hemoglobin of 9.3, CMP with mild hypokalemia with potassium 3.0.  Lactic acid elevated at 3.2  urinalysis pending, blood  cultures pending   Imaging Studies ordered:  I ordered imaging studies including chest x-ray I independently visualized and interpreted imaging which showed unremarkable I agree with the radiologist interpretation   Cardiac Monitoring:  The patient was maintained on a cardiac monitor.  I personally viewed and interpreted the cardiac monitored which showed an underlying rhythm of: Sinus rhythm   Medicines ordered and prescription drug management:  I ordered medication including potassium, IV fluids for hypokalemia and lactic acidosis I have reviewed the patients home medicines and have made adjustments as needed   Test Considered:  CT abdomen pelvis however normal white blood cell count, afebrile on arrival, abdomen benign, discussed with patient surgeon, felt to be not necessary at this time.   Critical Interventions:  IV fluids for elevated lactic at 3.2   Consultations Obtained:  I requested consultation with the Dr. Berline Lopes, patient's surgeon,  and discussed lab and imaging findings as well as pertinent plan - they recommend: will admit for obs with Dr. Berline Lopes to order abx.    Problem List / ED Course:  51 year old female presents with concern for fever of 104 at home after laparoscopic hysterectomy yesterday.  States that she began to feel like she may have a fever with chills and sweats this morning which prompted her to check her temperature, took 1 g of Tylenol at that time, currently feeling generally well.  On exam, generally well-appearing, her postop sites appear to be healing well without evidence of infection, her abdomen is generally benign, lung sounds are clear.  No lower extremity swelling.  Patient is afebrile on arrival in the emergency room (recently took 1 g of Tylenol). CBC with normal white blood cell count, hemoglobin of 9.3, not significantly changed compared to prior.  CMP with elevated glucose at 344, likely secondary to the soda/juice  she drank last  night, will provide IV fluids.  Potassium is 3.0, will replace orally.  Lactic acid is elevated at 3.2, given IV fluids.  Discussed with patient's surgeon Dr. Berline Lopes, plan is to admit, will order antibiotics and continue to trend lactic.  Chest x-ray is unremarkable, urinalysis pending.          Final Clinical Impression(s) / ED Diagnoses Final diagnoses:  Lactic acidosis  Fever, unspecified    Rx / DC Orders ED Discharge Orders     None         Tacy Learn, PA-C 05/19/21 1315    Valarie Merino, MD 05/20/21 [phone]

## 2021-05-19 NOTE — H&P (Signed)
GYN Oncology H&P                                                                            Crystal Vasquez 51 y.o. female  CC:  Chief Complaint  Patient presents with   Post-op Problem   Fever   Nausea   HPI: Crystal Vasquez is a 51 year old female initially seen in consultation in September 2022 at the request of Dr. Ilda Basset for a prolapsing uterine mass. The patient had a history of fibroids and was followed by a gyn in [place]. In November of 2021, she experienced heavy vaginal bleeding with clots for 24-36 hours with previously no menses for the past several months. On evaluation, she was found to be anemic but did not require a blood transfusion. She was told her pelvic examination was normal and related to perimenopausal changes. She feels she had a normal pap at that time as well. Medical history is notable for hypertension (poorly controlled), type 2 diabetes (poorly controlled, last A1c 7.2%), and hyperthyroidism.  She then moved to [facility] and had several months of normal menses. Starting in August 2022, she began having heavy vaginal bleeding with clots requiring the use of diapers and changes at 30 minute intervals. She sought care in the [facility] ER on 10/27/2020 with her hemoglobin being 10.5 on evaluation and the recommendation for outpatient follow up. She was seen by Dr. Ilda Basset, gynecologist, on 11/11/2020 and was noted to have a large mass prolapsing into her cervix. No biopsy taken at this visit and a pap was obtained. Pap smear returned as atypical glandular cells. Her bleeding slowed shortly after. She underwent an ultrasound on 11/15/2020 revealing a submucosal leiomyoma at the upper uterus 5.5 cm in diameter, additional 7.5 x 3.5 x 4.9 cm suspected mass at the lower uterine segment/cervix with complex fluid collection within the endometrial canal above the mass.   She was seen in consultation with GYN Oncology on 12/03/2020 with an attempted biopsy returning as  acutely inflamed glandular mucosal tissue and reactive changes. MR imaging was recommended and performed on 12/19/2020. This resulted with a 10 x 4 cm heterogeneous mass in the endometrial canal that extends to the cervical os. There is blood in the endometrial canal above this area. This heterogeneous area blends with the myometrium in the lower uterine segment and cervix and shows surrounding high signal suggesting that this is intracavitary. Findings are most suggestive of a large prolapsed leiomyoma to the level of the cervix. Scattered uterine leiomyomata, largest discrete leiomyomas, in the range of 1.8 cm. Normal ovaries. Of note, her hemoglobin A1C checked on 11/30/20 was 9.0 with repeat in Dec 2022 at 6.7. There were challenges with contacting the patient to discuss results, etc with the pt calling the office on 01/11/21 to give a different contact number. She had a telephone visit on 01/12/21 with plans for improvement in her glucose/A1C. The patient wanted to contact our office when she was ready to proceed with surgery.  In January 2023, the patient contacted our office stating she was ready to schedule her [place] and surgery. She was seen on 03/25/21 followed by a pre-operative appointment in our office on 05/05/2021. She  underwent uterine artery embolization with Dr. Dwaine Gale in [place] on 05/09/21. Of note, her pre-operative labs for this procedure returned with +COVID incidentally. She proceeded as planned and had an uneventful course after.   On 05/18/2021, she underwent robotic-assisted laparoscopic total hysterectomy with bilateral salpingectomy, contained morcellation given size of uterus with Dr. Jeral Pinch. She was discharged home same day.  On POD 1 (05/19/21), she woke up at home feeling poorly with fever-like symptoms, chills, nausea. Her temperature was 103 at home. She states she was drinking liquids all night long including regular ginger ale and water. She had some mild nausea with no emesis. She  states she was voiding without difficulty. It was recommended she seek evaluation in the ER per Dr. Berline Lopes for further workup. In the [facility] ER, her WBC count was at 8.7, hgb stable at 9.3, hct stable at 30.7, PLT 444, K+ 3.0, Na+ 130, glucose 344, lactic acid 3.2. Blood cultures were obtained and are in process. UA ordered. Chest xray returned with no active cardiopulmonary disease. After discussion with Dr. Berline Lopes and ER MD, it was decided to admit the patient under observation for IV hydration, IV antibiotics, repeat labs, monitoring.    Interval History: She reports mild nausea that is improving. She has abdominal soreness related to her incisions but no significant pain reported. Light vaginal spotting reported. She is emptying her bladder without difficulty and is passing flatus. Roommate at the bedside. No concerns voiced.   Review of Systems:  Constitutional: Feels poorly. Fever, chills, sweating, malaise at home.  Cardiovascular: No new chest pain, shortness of breath, or edema.  Pulmonary: No new cough or wheeze.  Gastrointestinal: Mild nausea. No vomiting, or diarrhea. No bright red blood per rectum or change in bowel movement.  Genitourinary: Light vaginal spotting reported. No frequency, urgency, or dysuria.  Musculoskeletal: No new myalgia or joint pain. Neurologic: No weakness, numbness, or change in gait.  Psychology: No new symptoms of depression, anxiety, or insomnia  Current Meds: Current medications reviewed.  Allergy: No Known Allergies  Social Hx:   Social History   Socioeconomic History   Marital status: Single    Spouse name: Not on file   Number of children: Not on file   Years of education: Not on file   Highest education level: Not on file  Occupational History   Not on file  Tobacco Use   Smoking status: Never   Smokeless tobacco: Never  Vaping Use   Vaping Use: Never used  Substance and Sexual Activity   Alcohol use: Not Currently   Drug use: Never    Sexual activity: Not Currently  Other Topics Concern   Not on file  Social History Narrative   Not on file   Social Determinants of Health   Financial Resource Strain: Not on file  Food Insecurity: Food Insecurity Present   Worried About [place] in the Last Year: Often true   Ran Out of Food in the Last Year: Sometimes true  Transportation Needs: Unmet Transportation Needs   Lack of Transportation (Medical): Yes   Lack of Transportation (Non-Medical): Yes  Physical Activity: Not on file  Stress: Not on file  Social Connections: Not on file  Intimate Partner Violence: Not on file    Past Surgical Hx:  Past Surgical History:  Procedure Laterality Date   BIOPSY THYROID     IR ANGIOGRAM PELVIS SELECTIVE OR SUPRASELECTIVE  05/09/2021   IR ANGIOGRAM SELECTIVE EACH ADDITIONAL VESSEL  05/09/2021  IR AORTAGRAM ABDOMINAL SERIALOGRAM  05/09/2021   IR EMBO TUMOR ORGAN ISCHEMIA INFARCT INC GUIDE ROADMAPPING  05/09/2021   IR RADIOLOGIST EVAL & MGMT  04/11/2021   IR US GUIDE VASC ACCESS LEFT  05/09/2021   ROBOTIC ASSISTED LAPAROSCOPIC HYSTERECTOMY AND SALPINGECTOMY Bilateral 05/18/2021   Procedure: XI ROBOTIC ASSISTED LAPAROSCOPIC HYSTERECTOMY AND SALPINGECTOMY, UTERUS MORE THAN 250 GRAMS;  Surgeon: Lafonda Mosses, MD;  Location: [facility];  Service: Gynecology;  Laterality: Bilateral;    Past Medical Hx:  Past Medical History:  Diagnosis Date   Anemia    Asthma    due to acid reflux   Depression    DM2    Dyspnea    GERD (gastroesophageal reflux disease)    Headache    Heart murmur    benign   Hypertension    Obesity (BMI 30-39.9)    Thyroid disease     Family Hx:  Family History  Problem Relation Age of Onset   Diabetes Mother    Hypertension Mother    Diabetes Father    Hypertension Father    Endometrial cancer Maternal Aunt    Colon cancer Neg Hx    Breast cancer Neg Hx    Ovarian cancer Neg Hx    Pancreatic cancer Neg Hx    Prostate cancer Neg Hx      Vitals:  Blood pressure (!) 129/58, pulse 86, temperature 98.5 F (36.9 C), temperature source Oral, resp. rate 16, height 5\' 6"  (1.676 m), weight 175 lb (79.4 kg), last menstrual period 04/27/2021, SpO2 95 %.  CBC    Component Value Date/Time   WBC 8.7 05/19/2021 1037   RBC 3.22 (L) 05/19/2021 1037   HGB 9.3 (L) 05/19/2021 1037   HGB 10.5 (L) 03/29/2021 1210   HGB 9.3 (L) 11/11/2020 1545   HCT 30.7 (L) 05/19/2021 1037   HCT 28.5 (L) 11/11/2020 1545   PLT 444 (H) 05/19/2021 1037   PLT 438 (H) 03/29/2021 1210   PLT 430 11/11/2020 1545   MCV 95.3 05/19/2021 1037   MCV 87 11/11/2020 1545   MCH 28.9 05/19/2021 1037   MCHC 30.3 05/19/2021 1037   RDW 19.7 (H) 05/19/2021 1037   RDW 19.1 (H) 11/11/2020 1545   LYMPHSABS 0.6 (L) 05/19/2021 1037   LYMPHSABS 1.9 11/11/2020 1545   MONOABS 0.4 05/19/2021 1037   EOSABS 0.0 05/19/2021 1037   EOSABS 0.1 11/11/2020 1545   BASOSABS 0.0 05/19/2021 1037   BASOSABS 0.0 11/11/2020 1545    CMP Latest Ref Rng & Units 05/19/2021 05/06/2021 03/29/2021  Glucose 70 - 99 mg/dL 344(H) 145(H) 91  BUN 6 - 20 mg/dL 10 11 20   Creatinine 0.44 - 1.00 mg/dL 0.84 0.54 0.72  Sodium 135 - 145 mmol/L 130(L) 135 139  Potassium 3.5 - 5.1 mmol/L 3.0(L) 3.6 3.7  Chloride 98 - 111 mmol/L 98 101 102  CO2 22 - 32 mmol/L 18(L) 24 27  Calcium 8.9 - 10.3 mg/dL 8.2(L) 9.1 10.0  Total Protein 6.5 - 8.1 g/dL 6.6 7.2 7.6  Total Bilirubin 0.3 - 1.2 mg/dL <0.1(L) <0.1(L) 0.2(L)  Alkaline Phos 38 - 126 U/L 55 60 48  AST 15 - 41 U/L 18 13(L) 11(L)  ALT 0 - 44 U/L 15 15 13    Lactic Acid, Venous    Component Value Date/Time   LATICACIDVEN 2.6 (HH) 05/19/2021 1323    Physical Exam (performed by Dr. Berline Lopes):  General: Well developed, well nourished female in no acute distress. Alert and oriented x 3.  Cardiovascular: Regular rate and rhythm. S1 and S2 normal.  Lungs: Clear to auscultation bilaterally. No wheezes/crackles/rhonchi noted.  Abdomen: Abdomen soft, non-tender and  obese. Active bowel sounds in all quadrants.  Genitourinary:      Vagina: Vaginal cuff intact, suture palpable. No tenderness or fluctuance. No vaginal bleeding or abnormal drainage noted on glove.  Extremities: No bilateral cyanosis, edema, or clubbing.    Assessment/Plan: 51 year old female s/p uterine artery embolization on 05/09/21 with IR and s/p robotic-assisted laparoscopic total hysterectomy with bilateral salpingectomy, contained morcellation given size of uterus by Dr. Jeral Pinch on 05/18/21 presenting to the ER with fever, chills, malaise. Patient overall non toxic with normal white count although elevated lactic acid noted. Too early for post-op infection. Given findings of necrotic prolapsing fibroid in surgery yesterday, suspect may have had early infection at the time of surgery. Talked with pathology, who can do a gram stain on the surgical specimen but unable to do culture due to specimen being in formalin.  Ancipitate 24 hours of IV antibiotics (zosyn ordered per Dr. Berline Lopes). If doing well after this time, will transition to oral antibiotics. Placed in observation at this time. Plan for repeat Bmet this afternoon and repeat labs in the am. Continue plan of care per Dr. Berline Lopes.       Dorothyann Gibbs, NP 05/19/2021, 1:36 PM

## 2021-05-19 NOTE — ED Triage Notes (Signed)
Patient reports that she had a hysterectomy yesterday. ?Patient states she had a temp 104. Orally. Patient states she took Tylenol 1 hour ago. ?Patient also c/o nausea. ?T. 98.0 in triage. ?

## 2021-05-19 NOTE — Telephone Encounter (Signed)
Received call from patients friend Crystal Vasquez this morning. She reports that Crystal Vasquez woke up "feeling like crap" and has a temperature of 103. She has taken her temperature twice to confirm. She took 1,000 mg of tylenol at 08:45am. She states she has nausea but no vomiting. She is drinking fluids, eating popsicles and had some yogurt last night. She is not passing gas and has not had a BM. Crystal Vasquez has looked at [name]'s incisions, she denies redness, swelling, warmth or drainage. She reports her pain as 6/10.Dr. Berline Lopes notified. ? ?Per Dr. Berline Lopes patient needs to go to the emergency room to be evaluated. Crystal Vasquez verbalized understanding and states they we be at the [facility] in about 20 minutes.  ?

## 2021-05-19 NOTE — TOC Progression Note (Signed)
Transition of Care (TOC) - Progression Note  ? ? ?Patient Details  ?Name: Crystal Vasquez ?MRN: [id number] ?Date of Birth: 1970-09-08 ? ?Transition of Care (TOC) CM/SW Contact  ?Purcell Mouton, RN ?Phone Number: ?05/19/2021, 2:35 PM ? ?Clinical Narrative:    ? ? ? ?Transition of Care (TOC) Screening Note ? ? ?Patient Details  ?Name: Crystal Vasquez ?Date of Birth: 19-Aug-1970 ? ? ?Transition of Care (TOC) CM/SW Contact:    ?Purcell Mouton, RN ?Phone Number: ?05/19/2021, 2:35 PM ? ? ? ?Transition of Care Department [facility]) has reviewed patient and no TOC needs have been identified at this time. We will continue to monitor patient advancement through interdisciplinary progression rounds. If new patient transition needs arise, please place a TOC consult. ?  ?  ?  ? ?Expected Discharge Plan and Services ?  ?  ?  ?  ?  ?                ?  ?  ?  ?  ?  ?  ?  ?  ?  ?  ? ? ?Social Determinants of Health (SDOH) Interventions ?  ? ?Readmission Risk Interventions ?No flowsheet data found. ? ?

## 2021-05-20 ENCOUNTER — Inpatient Hospital Stay (HOSPITAL_COMMUNITY): Payer: Self-pay

## 2021-05-20 DIAGNOSIS — E1165 Type 2 diabetes mellitus with hyperglycemia: Secondary | ICD-10-CM

## 2021-05-20 LAB — CBC WITH DIFFERENTIAL/PLATELET
Abs Immature Granulocytes: 0.03 10*3/uL (ref 0.00–0.07)
Basophils Absolute: 0 10*3/uL (ref 0.0–0.1)
Basophils Relative: 0 %
Eosinophils Absolute: 0 10*3/uL (ref 0.0–0.5)
Eosinophils Relative: 0 %
HCT: 26.7 % — ABNORMAL LOW (ref 36.0–46.0)
Hemoglobin: 8.1 g/dL — ABNORMAL LOW (ref 12.0–15.0)
Immature Granulocytes: 1 %
Lymphocytes Relative: 10 %
Lymphs Abs: 0.6 10*3/uL — ABNORMAL LOW (ref 0.7–4.0)
MCH: 28.6 pg (ref 26.0–34.0)
MCHC: 30.3 g/dL (ref 30.0–36.0)
MCV: 94.3 fL (ref 80.0–100.0)
Monocytes Absolute: 0.3 10*3/uL (ref 0.1–1.0)
Monocytes Relative: 5 %
Neutro Abs: 4.9 10*3/uL (ref 1.7–7.7)
Neutrophils Relative %: 84 %
Platelets: 371 10*3/uL (ref 150–400)
RBC: 2.83 MIL/uL — ABNORMAL LOW (ref 3.87–5.11)
RDW: 19.8 % — ABNORMAL HIGH (ref 11.5–15.5)
WBC: 5.8 10*3/uL (ref 4.0–10.5)
nRBC: 0 % (ref 0.0–0.2)

## 2021-05-20 LAB — RESPIRATORY PANEL BY PCR

## 2021-05-20 LAB — BASIC METABOLIC PANEL
Anion gap: 10 (ref 5–15)
Anion gap: 9 (ref 5–15)
BUN: 9 mg/dL (ref 6–20)
BUN: 9 mg/dL (ref 6–20)
CO2: 20 mmol/L — ABNORMAL LOW (ref 22–32)
CO2: 22 mmol/L (ref 22–32)
Calcium: 7.9 mg/dL — ABNORMAL LOW (ref 8.9–10.3)
Calcium: 8.4 mg/dL — ABNORMAL LOW (ref 8.9–10.3)
Chloride: 101 mmol/L (ref 98–111)
Chloride: 103 mmol/L (ref 98–111)
Creatinine, Ser: 0.53 mg/dL (ref 0.44–1.00)
Creatinine, Ser: 0.73 mg/dL (ref 0.44–1.00)
GFR, Estimated: >60 mL/min (ref >60)
GFR, Estimated: >60 mL/min (ref >60)
Glucose, Bld: 227 mg/dL — ABNORMAL HIGH (ref 70–99)
Glucose, Bld: 194 mg/dL — ABNORMAL HIGH (ref 70–99)
Potassium: 3.9 mmol/L (ref 3.5–5.1)
Potassium: 3.6 mmol/L (ref 3.5–5.1)
Sodium: 131 mmol/L — ABNORMAL LOW (ref 135–145)
Sodium: 134 mmol/L — ABNORMAL LOW (ref 135–145)

## 2021-05-20 LAB — LACTIC ACID, PLASMA
Lactic Acid, Venous: 2.6 mmol/L (ref 0.5–1.9)
Lactic Acid, Venous: 2.3 mmol/L (ref 0.5–1.9)

## 2021-05-20 LAB — GLUCOSE, CAPILLARY
Glucose-Capillary: 223 mg/dL — ABNORMAL HIGH (ref 70–99)
Glucose-Capillary: 219 mg/dL — ABNORMAL HIGH (ref 70–99)
Glucose-Capillary: 178 mg/dL — ABNORMAL HIGH (ref 70–99)
Glucose-Capillary: 195 mg/dL — ABNORMAL HIGH (ref 70–99)

## 2021-05-20 MED ORDER — IOHEXOL 300 MG/ML  SOLN
100.0000 mL | Freq: Once | INTRAMUSCULAR | Status: AC | PRN
  Administered 2021-05-20: 100 mL via INTRAVENOUS

## 2021-05-20 MED ORDER — INSULIN GLARGINE-YFGN 100 UNIT/ML ~~LOC~~ SOLN
8.0000 [IU] | Freq: Every day | SUBCUTANEOUS | Status: DC
  Administered 2021-05-20 – 2021-05-21 (×2): 8 [IU] via SUBCUTANEOUS
  Filled 2021-05-20 (×3): qty 0.08

## 2021-05-20 MED ORDER — ACETAMINOPHEN 325 MG PO TABS
650.0000 mg | ORAL_TABLET | Freq: Once | ORAL | Status: AC
  Administered 2021-05-20: 650 mg via ORAL
  Filled 2021-05-20: qty 2

## 2021-05-20 MED ORDER — IOHEXOL 9 MG/ML PO SOLN
1000.0000 mL | ORAL | Status: AC
  Administered 2021-05-20: 1000 mL via ORAL

## 2021-05-20 MED ORDER — POTASSIUM CHLORIDE IN NACL 20-0.45 MEQ/L-% IV SOLN
INTRAVENOUS | Status: DC
  Filled 2021-05-20 (×3): qty 1000

## 2021-05-20 NOTE — Progress Notes (Signed)
Patient triggered a yellow mews due to a temperature of 102.8. House coverage practitioner Clarene Essex NP) notified, no new orders at this time.  ?

## 2021-05-20 NOTE — Progress Notes (Signed)
Gynecologic Oncology Progress Note ? ?Subjective: ?Patient reports having chills with shivering throughout the night. Feeling slightly better this am. Tolerating diet with no nausea or emesis. No abdominal pain reported. Passing flatus with no recent bowel movement reported. Ambulated in the halls last pm without difficulty. States she is voiding adequate amounts but does not get the feeling to void when her bladder is full. No vaginal bleeding reported this am. Denies chest pain, dyspnea. Reports having a non-productive cough. ? ?Objective: ?Vital signs in last 24 hours: ?Temp:  [98 ?F (36.7 ?C)-102.8 ?F (39.3 ?C)] 98.3 ?F (36.8 ?C) (03/03 0719) ?Pulse Rate:  [71-102] 79 (03/03 0719) ?Resp:  [16-25] 16 (03/03 0719) ?BP: (100-131)/(55-71) 111/57 (03/03 0719) ?SpO2:  [92 %-98 %] 98 % (03/03 0719) ?Weight:  [175 lb (79.4 kg)] 175 lb (79.4 kg) (03/02 1006) ?  ? ?Intake/Output from previous day: ?03/02 0701 - 03/03 0700 ?In: 2596.8 [P.O.:360; I.V.:1125; IV Piggyback:1111.8] ?Out: 900 [Urine:900] ? ?Physical Examination: ?General: alert, cooperative, and no distress ?Resp: clear to auscultation bilaterally ?Cardio: regular rate and rhythm, S1, S2 normal, no murmur, click, rub or gallop ?GI: incision: lap sites to the abdomen with dermabond intact without erythema or drainage and abdomen soft, active bowel sounds, more distended compared with 3/2 assessment, non-tender with palpation. ?Extremities: extremities normal, atraumatic, no cyanosis or edema ? ?Labs: ?WBC/Hgb/Hct/Plts:  5.8/8.1/26.7/371 (03/03 4196) BUN/Cr/glu/ALT/AST/amyl/lip:  9/0.53/--/--/--/--/-- (03/03 0549) ? ?Assessment: ?51 y.o. female s/p uterine artery embolization on 05/09/21 with IR and s/p robotic-assisted laparoscopic total hysterectomy with bilateral salpingectomy, contained morcellation given size of uterus by Dr. Jeral Pinch on 05/18/21 currently admitted with fever, chills, malaise. See H&P for additional details if needed. ? ?Pain:  Pain is  well-controlled. PRN medications ordered. ? ?Heme: Hgb 8.1 and Hct 26.7 this am- most likely related to hemodilution. Hgb/Hct in the ER on 05/19/21 post-op stable compared with pre-operative values and surgical losses. ? ?ID: WBC 5.8 this am. Zosyn initially started on 05/19/21 at 16:00. Febrile yesterday evening at 101.2 and early this am at 102.8 at 05:30 am. Pathology to add gram stain if possible to necrotic fibroid in surgical specimen, unable to do culture since in formalin. Lactic acid at 2.6 from 3.2 on 05/19/21.   ? ?CV: BP and HR overall stable. Amlodipine and metoprolol ordered. Continue to monitor with ordered vital signs. ? ?GI:  Tolerating po: Yes. Antiemetics ordered if needed.  ? ?GU: Voiding without difficulty. Creatinine at 0.53 this am.   ? ?FEN: K+ 3.9 this am, improved from 3.0 in the ER. Na+ 131, 130 in ER on 05/19/21.  ? ?Endo: Diabetes mellitus Type II, under poor control.  CBG: CBG (last 3)  ?Recent Labs  ?  05/19/21 ?1707 05/19/21 ?2133 05/20/21 ?0717  ?GLUCAP 308* 228* 223*  ?  ? ?Prophylaxis: Lovenox ordered along with SCDs. ? ?Plan: ?-IVF rate increased. ?-If patient spikes another elevated temperature during the day today and/or after 24 hours of IV antibiotics, CT scan will be obtained.  ?-Given the timing, it would be highly unlikely for this to be a true post-op infection with this most likely representing an infection that was present at the time of surgery. ?-Pathology to follow up on gram stain from surgical specimen. ?-Spoke with lab. Unable to order influenza testing alone, full resp panel will have to be ordered. Would anticipate COVID to be positive given her recent positive testing on 05/09/21.  ?-Hospitalist consulted for recommendations regarding diabetes management. Appreciate consultation ?-Continue plan of care per  Dr. Berline Lopes. ? ? LOS: 0 days  ? ? ?Sabre Romberger D Guerry Covington ?05/20/2021, 9:25 AM ? ? ? ?   ?

## 2021-05-20 NOTE — Progress Notes (Addendum)
Date and time results received: 05/20/21 1342 ?(use smartphrase ".now" to insert current time) ? ?Test: lactic acid  ?Critical Value: 2.3 ? ?Name of Provider Notified: Joylene John, NP. ? ?Orders Received? Or Actions Taken?:  ? ?No new orders.  ?

## 2021-05-20 NOTE — Consult Note (Signed)
Initial Consultation Note ? ? ?Patient: VIDHI DELELLIS RCV:[id number] DOB: 1971-02-23 PCP: Camillia Herter, NP ?DOA: 05/19/2021 ?DOS: the patient was seen and examined on 05/20/2021 ?Primary service: Lafonda Mosses, MD ? ?Referring physician: Dr. Berline Lopes ?Reason for consult: DM mgmt ? ?Assessment/Plan: ?Assessment and Plan: ?No notes have been filed under this hospital service. ?Service: Hospitalist ?DM2 ?    - admitted to [facility] service for post-op fever ?    - infection likely affecting DM control ?    - continue SSI; stop metformin d/t lactic acidosis ?    - add 8 units semglee ?    - last A1c: 7.2 on 03/30/21 ?    - DM coordinator consult ? ?FUO/Post op fever ?    - per primary team ? ?Remainder per primary team  ? ?[facility] will continue to follow the patient. ? ?HPI: Onyx A Cloninger is a 51 y.o. female with past medical history of DM2, HTN, GERD, GAD. Presenting with post-op fever.  ? ?Per Gyn-Onc H&P: In January 2023, the patient contacted our office stating she was ready to schedule her [place] and surgery. She was seen on 03/25/21 followed by a pre-operative appointment in our office on 05/05/2021. She underwent uterine artery embolization with Dr. Dwaine Gale in [place] on 05/09/21. Of note, her pre-operative labs for this procedure returned with +COVID incidentally. She proceeded as planned and had an uneventful course after.   ?On 05/18/2021, she underwent robotic-assisted laparoscopic total hysterectomy with bilateral salpingectomy, contained morcellation given size of uterus with Dr. Jeral Pinch. She was discharged home same day. ?On POD 1 (05/19/21), she woke up at home feeling poorly with fever-like symptoms, chills, nausea. Her temperature was 103 at home. She states she was drinking liquids all night long including regular ginger ale and water. She had some mild nausea with no emesis. She states she was voiding without difficulty. It was recommended she seek evaluation in the ER per Dr. Berline Lopes for further workup.  In the [facility] ER, her WBC count was at 8.7, hgb stable at 9.3, hct stable at 30.7, PLT 444, K+ 3.0, Na+ 130, glucose 344, lactic acid 3.2. Blood cultures were obtained and are in process. UA ordered. Chest xray returned with no active cardiopulmonary disease. After discussion with Dr. Berline Lopes and ER MD, it was decided to admit the patient under observation for IV hydration, IV antibiotics, repeat labs, monitoring.  ? ?During her stay, her glucose has not been optimized. She has ranged from 223 to 344. She has been placed on metformin and SSI. Her home regimen is glipizide and metformin. Her last A1c in January of this year was 7.2. She reports compliance with her home regimen and no real difficulty at home. TRH was consulted for DM management. ? ?Review of Systems: As mentioned in the history of present illness. All other systems reviewed and are negative. ?Past Medical History:  ?Diagnosis Date  ? Anemia   ? Asthma   ? due to acid reflux  ? Depression   ? DM2   ? Dyspnea   ? GERD (gastroesophageal reflux disease)   ? Headache   ? Heart murmur   ? benign  ? Hypertension   ? Obesity (BMI 30-39.9)   ? Thyroid disease   ? ?Past Surgical History:  ?Procedure Laterality Date  ? BIOPSY THYROID    ? IR ANGIOGRAM PELVIS SELECTIVE OR SUPRASELECTIVE  05/09/2021  ? IR ANGIOGRAM SELECTIVE EACH ADDITIONAL VESSEL  05/09/2021  ? IR [place]  05/09/2021  ? IR EMBO TUMOR ORGAN ISCHEMIA INFARCT INC GUIDE ROADMAPPING  05/09/2021  ? IR RADIOLOGIST EVAL & MGMT  04/11/2021  ? IR US GUIDE VASC ACCESS LEFT  05/09/2021  ? ROBOTIC ASSISTED LAPAROSCOPIC HYSTERECTOMY AND SALPINGECTOMY Bilateral 05/18/2021  ? Procedure: XI ROBOTIC ASSISTED LAPAROSCOPIC HYSTERECTOMY AND SALPINGECTOMY, UTERUS MORE THAN 250 GRAMS;  Surgeon: Lafonda Mosses, MD;  Location: [facility] ORS;  Service: Gynecology;  Laterality: Bilateral;  ? ?Social History:  reports that she has never smoked. She has never used smokeless tobacco. She reports that she does not  currently use alcohol. She reports that she does not use drugs. ? ?No Known Allergies ? ?Family History  ?Problem Relation Age of Onset  ? Diabetes Mother   ? Hypertension Mother   ? Diabetes Father   ? Hypertension Father   ? Endometrial cancer Maternal Aunt   ? Colon cancer Neg Hx   ? Breast cancer Neg Hx   ? Ovarian cancer Neg Hx   ? Pancreatic cancer Neg Hx   ? Prostate cancer Neg Hx   ? ? ?Prior to Admission medications   ?Medication Sig Start Date End Date Taking? Authorizing Provider  ?acetaminophen (TYLENOL) 325 MG tablet Take 650 mg by mouth every 6 (six) hours as needed for moderate pain.   Yes [provider]  ?amLODipine (NORVASC) 10 MG tablet Take 1 tablet (10 mg total) by mouth daily. 02/18/21  Yes Dorna Mai, MD  ?docusate sodium (COLACE) 100 MG capsule Take 1 capsule (100 mg total) by mouth 2 (two) times daily for 14 days. 05/10/21 05/24/21 Yes Soyla Dryer R, NP  ?ferrous sulfate 325 (65 FE) MG tablet Take 325 mg by mouth in the morning.   Yes [provider]  ?fluticasone (VERAMYST) 27.5 MCG/SPRAY nasal spray Place 1 spray into the nose in the morning.   Yes [provider]  ?glipiZIDE (GLUCOTROL) 10 MG tablet Take 1 tablet (10 mg total) by mouth 2 (two) times daily before a meal. 02/18/21  Yes Dorna Mai, MD  ?hydrochlorothiazide (HYDRODIURIL) 25 MG tablet Take 1 tablet (25 mg total) by mouth daily. 02/18/21  Yes Dorna Mai, MD  ?ibuprofen (ADVIL) 200 MG tablet Take 400 mg by mouth every 6 (six) hours as needed (pain).   Yes [provider]  ?lisinopril (ZESTRIL) 40 MG tablet Take 1 tablet (40 mg total) by mouth daily. 02/18/21  Yes Dorna Mai, MD  ?medroxyPROGESTERone (PROVERA) 10 MG tablet Take 1 tablet (10 mg total) by mouth daily. 05/05/21  Yes Cross, Lenna Sciara D, NP  ?metFORMIN (GLUCOPHAGE) 1000 MG tablet Take 1 tablet (1,000 mg total) by mouth 2 (two) times daily with a meal. 02/18/21  Yes Dorna Mai, MD  ?metoprolol succinate (TOPROL-XL) 25  MG 24 hr tablet Take 1 tablet (25 mg total) by mouth daily. 02/18/21  Yes Dorna Mai, MD  ?omeprazole (PRILOSEC OTC) 20 MG tablet Take 20 mg by mouth every morning.   Yes [provider]  ?oxyCODONE (OXY IR/ROXICODONE) 5 MG immediate release tablet Take 5 mg by mouth every 6 (six) hours as needed for severe pain.   Yes [provider]  ?traMADol (ULTRAM) 50 MG tablet Take 1 tablet (50 mg total) by mouth every 6 (six) hours as needed for severe pain. For AFTER surgery only, do not take and drive 05/16/31  Yes Cross, Melissa D, NP  ?senna-docusate (SENOKOT-S) 8.6-50 MG tablet Take 2 tablets by mouth at bedtime. For AFTER surgery, do not take if having diarrhea ?Patient  not taking: Reported on 05/19/2021 05/05/21   Joylene John D, NP  ? ? ?Physical Exam: ?Vitals:  ? 05/20/21 0122 05/20/21 0531 05/20/21 0719 05/20/21 8786  ?BP: 121/65 117/71 (!) 111/57 (!) 106/55  ?Pulse: 71 81 79 72  ?Resp: 16 16 16 15   ?Temp: 98.5 ?F (36.9 ?C) (!) 102.8 ?F (39.3 ?C) 98.3 ?F (36.8 ?C) 97.9 ?F (36.6 ?C)  ?TempSrc: Oral Oral Oral   ?SpO2: 92% 96% 98% 99%  ?Weight:      ?Height:      ? ?General: 51 y.o. female resting in bed in NAD ?Eyes: PERRL, normal sclera ?ENMT: Nares patent w/o discharge, orophaynx clear, dentition normal, ears w/o discharge/lesions/ulcers ?Neck: Supple, trachea midline ?Cardiovascular: RRR, +S1, S2, no m/g/r, equal pulses throughout ?Respiratory: CTABL, no w/r/r, normal WOB ?GI: BS+, NDNT, no masses noted, no organomegaly noted ?MSK: No e/c/c ?Neuro: A&O x 3, no focal deficits ?Psyc: Appropriate interaction and affect, calm/cooperative ? ?Data Reviewed:  ? ?Glucose 344 -> 324 -> 227 ? ?Family Communication: None at bedside ?Thank you very much for involving [place] in the care of your patient. ? ?Author: ?Jonnie Finner, DO ?05/20/2021 9:36 AM ?

## 2021-05-20 NOTE — Progress Notes (Signed)
GYN Onc Progress Note ? ?Patient in bed covered in multiple blankets stating she is cold, having shivers/chills, and feeling poorly related to elevated temperature. Temperature checked at the bedside and returned as 102.1. She is tolerating diet with no nausea or emesis. Informed of respiratory panel results.  ? ?Given fever of 102.1, plan for CT imaging discussed with patient by Dr. Berline Lopes. Spoke with pharmacist who did not feel like broadening of antibiotic coverage was necessary at this time and recommended seeing what the CT scan resulted first. ? ?No concerns voiced per pt. AM labs ordered.  ?

## 2021-05-21 DIAGNOSIS — I10 Essential (primary) hypertension: Secondary | ICD-10-CM

## 2021-05-21 LAB — CBC
HCT: 24.8 % — ABNORMAL LOW (ref 36.0–46.0)
Hemoglobin: 7.4 g/dL — ABNORMAL LOW (ref 12.0–15.0)
MCH: 27.5 pg (ref 26.0–34.0)
MCHC: 29.8 g/dL — ABNORMAL LOW (ref 30.0–36.0)
MCV: 92.2 fL (ref 80.0–100.0)
Platelets: 401 10*3/uL — ABNORMAL HIGH (ref 150–400)
RBC: 2.69 MIL/uL — ABNORMAL LOW (ref 3.87–5.11)
RDW: 19.7 % — ABNORMAL HIGH (ref 11.5–15.5)
WBC: 4.6 10*3/uL (ref 4.0–10.5)
nRBC: 0 % (ref 0.0–0.2)

## 2021-05-21 LAB — GLUCOSE, CAPILLARY
Glucose-Capillary: 206 mg/dL — ABNORMAL HIGH (ref 70–99)
Glucose-Capillary: 284 mg/dL — ABNORMAL HIGH (ref 70–99)
Glucose-Capillary: 235 mg/dL — ABNORMAL HIGH (ref 70–99)
Glucose-Capillary: 165 mg/dL — ABNORMAL HIGH (ref 70–99)

## 2021-05-21 LAB — BASIC METABOLIC PANEL
Anion gap: 8 (ref 5–15)
BUN: 8 mg/dL (ref 6–20)
CO2: 21 mmol/L — ABNORMAL LOW (ref 22–32)
Calcium: 8.2 mg/dL — ABNORMAL LOW (ref 8.9–10.3)
Chloride: 103 mmol/L (ref 98–111)
Creatinine, Ser: 0.51 mg/dL (ref 0.44–1.00)
GFR, Estimated: >60 mL/min (ref >60)
Glucose, Bld: 207 mg/dL — ABNORMAL HIGH (ref 70–99)
Potassium: 3.7 mmol/L (ref 3.5–5.1)
Sodium: 132 mmol/L — ABNORMAL LOW (ref 135–145)

## 2021-05-21 LAB — LACTIC ACID, PLASMA: Lactic Acid, Venous: 1.1 mmol/L (ref 0.5–1.9)

## 2021-05-21 NOTE — Progress Notes (Signed)
PROGRESS NOTE    Crystal Vasquez  JOI:[id number] DOB: Aug 02, 1970 DOA: 05/19/2021 PCP: Camillia Herter, NP   Brief Narrative: Crystal Vasquez is a 51 y.o. female with a history of hypertension, diabetes mellitus, anemia that presented secondary to fever and nausea in the post-op period. Patient was admitted by gynecology oncology service for workup of post-op fever. During admission, patient developed persistently moderate hyperglycemia and medicine service was consulted for management. Patient is managed on SSI and insuline glargine initiated as well.   Assessment and Plan: * Fever postop Tmax of 102 F on 3/3. Thorough fever workup initiated per primary service with no current positive results. Patient is currently on empiric Zosyn IV. Per primary service.  Uncontrolled type 2 diabetes mellitus with hyperglycemia (HCC) Hemoglobin A1C of 7.2% as an outpatient, just above goal of <7%. Patient is managed on metformin and glipizide as an outpatient. This admission, blood sugar ranging in 200s. Likely related to receiving dexamethasone, which may keep blood sugar elevated for a few days. Also in setting of possible infection. Managed on Novolog SSI inpatient. Insulin glargine initiated, although will likely discontinue this prior to discharge. -Continue insuline glargine 8 units for now -Continue SSI -On discharge, recommendation to continue home metformin and glipizide and likely discontinue insulin prior to discharge  Hypertension Mostly well controlled. -Continue home metoprolol amlodipine and hydrochlorothiazide -No recommendations for medication changes at discharge at this time  Iron deficiency anemia due to chronic blood loss Acute on chronic with hemoglobin of 7.4 this morning. Addressed by primary team who will consider blood transfusion if patient becomes symptomatic.     DVT prophylaxis: Per primary Code Status:   Code Status: Full Code Family Communication: None  at bedside Disposition Plan: Per primary   Procedures:  None  Antimicrobials: Zosyn IV    Subjective: Patient reports no issues this morning. She has been walking around without issue. She reports blood sugar readings in the 200s at home.  Objective: BP 119/70 (BP Location: Right Arm)    Pulse 71    Temp 98.7 F (37.1 C) (Oral)    Resp 18    Ht '5\' 6"'$  (1.676 m)    Wt 79.4 kg    LMP 04/27/2021 (Exact Date)    SpO2 98%    BMI 28.25 kg/m   Examination:  General exam: Appears calm and comfortable Respiratory system: Clear to auscultation. Respiratory effort normal. Cardiovascular system: S1 & S2 heard, RRR. No murmurs, rubs, gallops or clicks. Gastrointestinal system: Abdomen is nondistended, soft and nontender. No organomegaly or masses felt. Normal bowel sounds heard. Central nervous system: Alert and oriented. No focal neurological deficits. Musculoskeletal: Trace bilateral LE edema. No calf tenderness Skin: No cyanosis. No rashes Psychiatry: Judgement and insight appear normal. Mood & affect appropriate.    Data Reviewed: I have personally reviewed following labs and imaging studies  CBC Lab Results  Component Value Date   WBC 4.6 05/21/2021   RBC 2.69 (L) 05/21/2021   HGB 7.4 (L) 05/21/2021   HCT 24.8 (L) 05/21/2021   MCV 92.2 05/21/2021   MCH 27.5 05/21/2021   PLT 401 (H) 05/21/2021   MCHC 29.8 (L) 05/21/2021   RDW 19.7 (H) 05/21/2021   LYMPHSABS 0.6 (L) 05/20/2021   MONOABS 0.3 05/20/2021   EOSABS 0.0 05/20/2021   BASOSABS 0.0 47/11/6281     Last metabolic panel Lab Results  Component Value Date   NA 132 (L) 05/21/2021   K 3.7 05/21/2021   CL 103  05/21/2021   CO2 21 (L) 05/21/2021   BUN 8 05/21/2021   CREATININE 0.51 05/21/2021   GLUCOSE 207 (H) 05/21/2021   GFRNONAA >60 05/21/2021   CALCIUM 8.2 (L) 05/21/2021   PROT 6.6 05/19/2021   ALBUMIN 3.2 (L) 05/19/2021   BILITOT <0.1 (L) 05/19/2021   ALKPHOS 55 05/19/2021   AST 18 05/19/2021   ALT 15  05/19/2021   ANIONGAP 8 05/21/2021    GFR: Estimated Creatinine Clearance: 88.4 mL/min (by C-G formula based on SCr of 0.51 mg/dL).  Recent Results (from the past 240 hour(s))  Culture, blood (routine x 2)     Status: None (Preliminary result)   Collection Time: 05/19/21 10:37 AM   Specimen: BLOOD  Result Value Ref Range Status   Specimen Description   Final    BLOOD RIGHT ANTECUBITAL Performed at [facility] [address] [place], [place] [address]    Special Requests   Final    BOTTLES DRAWN AEROBIC AND ANAEROBIC Blood Culture results may not be optimal due to an excessive volume of blood received in culture bottles Performed at [facility] [address], [place], [place] [address]    Culture   Final    NO GROWTH 2 DAYS Performed at [facility] [address] [place], [place] [address]    Report Status PENDING  Incomplete  Culture, blood (routine x 2)     Status: None (Preliminary result)   Collection Time: 05/19/21 11:03 AM   Specimen: BLOOD  Result Value Ref Range Status   Specimen Description   Final    BLOOD BLOOD LEFT HAND Performed at [facility] [address], [place], [place] [address]    Special Requests   Final    BOTTLES DRAWN AEROBIC AND ANAEROBIC Blood Culture adequate volume Performed at [facility] [address] [place], [place] [address]    Culture   Final    NO GROWTH 2 DAYS Performed at [facility] [address], [place],  [address]    Report Status PENDING  Incomplete  Respiratory (~20 pathogens) panel by PCR     Status: None   Collection Time: 05/20/21 10:05 AM   Specimen: Nasopharyngeal Swab; Respiratory  Result Value Ref Range Status   Adenovirus NOT DETECTED NOT DETECTED Final   Coronavirus 229E NOT DETECTED NOT DETECTED Final    Comment: (NOTE) The Coronavirus on the Respiratory Panel, DOES NOT test for the novel  Coronavirus  (2019 nCoV)    Coronavirus HKU1 NOT DETECTED NOT DETECTED Final   Coronavirus NL63 NOT DETECTED NOT DETECTED Final   Coronavirus OC43 NOT DETECTED NOT DETECTED Final   Metapneumovirus NOT DETECTED NOT DETECTED Final   Rhinovirus / Enterovirus NOT DETECTED NOT DETECTED Final   Influenza A NOT DETECTED NOT DETECTED Final   Influenza B NOT DETECTED NOT DETECTED Final   Parainfluenza Virus 1 NOT DETECTED NOT DETECTED Final   Parainfluenza Virus 2 NOT DETECTED NOT DETECTED Final   Parainfluenza Virus 3 NOT DETECTED NOT DETECTED Final   Parainfluenza Virus 4 NOT DETECTED NOT DETECTED Final   Respiratory Syncytial Virus NOT DETECTED NOT DETECTED Final   Bordetella pertussis NOT DETECTED NOT DETECTED Final   Bordetella Parapertussis NOT DETECTED NOT DETECTED Final   Chlamydophila pneumoniae NOT DETECTED NOT DETECTED Final   Mycoplasma pneumoniae NOT DETECTED NOT DETECTED Final    Comment: Performed at [facility], [place]. [address], [place], [place]  [address]      Radiology Studies: CT ABDOMEN PELVIS W CONTRAST  Addendum Date: 05/20/2021   ADDENDUM REPORT: 05/20/2021 20:53 ADDENDUM: Findings were reported to patient's nurse Angelica at 2:58 p.m. Electronically Signed   By: Brett Fairy M.D.   On: 05/20/2021 20:53   Result Date: 05/20/2021 CLINICAL DATA:  Postop for necrotic prolapsing fibroid, sepsis. EXAM: CT ABDOMEN AND PELVIS WITH CONTRAST TECHNIQUE: Multidetector CT imaging of the abdomen and pelvis was performed using the standard protocol following bolus administration of intravenous contrast. RADIATION DOSE REDUCTION: This exam was performed according to the departmental dose-optimization program which includes automated exposure control, adjustment of the mA and/or kV according to patient size and/or use of iterative reconstruction technique. CONTRAST:  175m OMNIPAQUE IOHEXOL 300 MG/ML  SOLN COMPARISON:  11/15/2020. FINDINGS: Lower chest: Strandy opacities are present at the lung  bases. There are trace bilateral pleural effusions, greater on the right than on the left. Hepatobiliary: No focal liver abnormality is seen. No gallstones, gallbladder wall thickening, or biliary dilatation. Pancreas: Unremarkable. No pancreatic ductal dilatation or surrounding inflammatory changes. Spleen: Normal in size without focal abnormality. Adrenals/Urinary Tract: No adrenal nodule or mass. A nonobstructive left renal calculus is noted. The kidneys enhance symmetrically. No obstructive uropathy bilaterally. The bladder is unremarkable. Stomach/Bowel: The stomach is within normal limits. No bowel obstruction or pneumatosis. Scattered diverticula are present along the colon without evidence of diverticulitis. A normal appendix is seen in the right lower quadrant. No focal bowel wall thickening. Vascular/Lymphatic: No significant vascular findings are present. No enlarged abdominal or pelvic lymph nodes. Reproductive: Presumed hysterectomy changes are present in the pelvis. No adnexal mass is seen. There is fat stranding and a small amount of free fluid in the surgical bed. No abscess is identified. Scattered free air is noted in the abdomen and pelvis, likely within normal limits for postoperative status. Other: Small fat containing umbilical hernia. Musculoskeletal: Air is identified in the at tumoral abdominal wall bilaterally, greater on the left than on the right and is likely related to recent surgery. Degenerative changes in the thoracolumbar spine. No acute osseous abnormality. IMPRESSION: 1. Status post hysterectomy with fat stranding and a small amount of free fluid in the surgical bed. No abscess is seen. Free air is identified in the abdomen and pelvis which is likely related to patient's region surgery. 2. Strandy opacities at the lung bases with trace bilateral pleural effusions, possible atelectasis or infiltrate. 3. Nonobstructive left renal calculus. 4. Diverticulosis without diverticulitis.  Electronically Signed: By: LBrett FairyM.D. On: 05/20/2021 20:30      LOS: 1 day    RCordelia Poche MD [facility] 05/21/2021, 2:14 PM   If 7PM-7AM, please contact night-coverage [email or web address]

## 2021-05-21 NOTE — Assessment & Plan Note (Addendum)
Tmax of 102 ?F on 3/3. Thorough fever workup initiated per primary service with no current positive results. Patient was on empiric Zosyn IV and is now transitioned to Augmentin. Per primary service. ?

## 2021-05-21 NOTE — Assessment & Plan Note (Addendum)
Mostly well controlled. ?-Continue home metoprolol, amlodipine and hydrochlorothiazide ?-No recommendations for medication changes at discharge at this time ?

## 2021-05-21 NOTE — Progress Notes (Signed)
Pharmacy Antibiotic Note ? ?Crystal Vasquez is a 51 y.o. female admitted on 05/19/2021 with sepsis.  Pharmacy has been consulted for Zosyn dosing. ? ?Pt with fever post-operatively, necrotic fibroid at time of hysterectomy yesterday s/p uterine artery embolization on 2/20, elevated lactic acid, urine samples/blood cultures pending.  GYN/Onc plans noted to continue IV abx until she is at least 24 hours without a fever.  Last fever on 3/3 at 1600.  Plan will be to transition to oral antibiotics and monitor for 24 hours. ? ?Plan: ?Zosyn 3.375g IV q8h (4 hour infusion). ?No dose adjustments needed, Pharmacy will sign off ? ?Height: '5\' 6"'$  (167.6 cm) ?Weight: 79.4 kg (175 lb) ?IBW/kg (Calculated) : 59.3 ? ?Temp (24hrs), Avg:99.4 ?F (37.4 ?C), Min:97.9 ?F (36.6 ?C), Max:102 ?F (38.9 ?C) ? ?Recent Labs  ?Lab 05/19/21 ?1037 05/19/21 ?1102 05/19/21 ?1323 05/19/21 ?1525 05/20/21 ?0422 05/20/21 ?5638 05/20/21 ?1217 05/21/21 ?0411  ?WBC 8.7  --   --   --   --  5.8  --  4.6  ?CREATININE 0.84  --   --  0.72  --  0.53 0.73 0.51  ?LATICACIDVEN  --  3.2* 2.6*  --  2.6*  --  2.3* 1.1  ? ?  ?Estimated Creatinine Clearance: 88.4 mL/min (by C-G formula based on SCr of 0.51 mg/dL).   ? ?No Known Allergies ? ?Antimicrobials this admission: ?3/2  Zosyn >>  ?  ?Dose adjustments this admission: ? ?Microbiology results: ?3/2 BCx: ngtd ?3/2: UA obtained w/reflex UCx ?3/3 Respiratory panel: neg ?  ? ?Thank you for allowing pharmacy to be a part of this patient?s care. ? ?Peggyann Juba, PharmD, BCPS ?Pharmacy: [phone] ?05/21/2021 10:57 AM ? ? ?

## 2021-05-21 NOTE — Assessment & Plan Note (Addendum)
Acute on chronic with hemoglobin of 7.6 this morning and is stable. Addressed by primary team who will consider blood transfusion if patient becomes symptomatic. ?

## 2021-05-21 NOTE — Assessment & Plan Note (Addendum)
Hemoglobin A1C of 7.2% as an outpatient, just above goal of <7%. Patient is managed on metformin and glipizide as an outpatient. This admission, blood sugar ranging in 200s. Likely related to receiving dexamethasone, which may keep blood sugar elevated for a few days. Also in setting of possible infection. Managed on Novolog SSI inpatient. Insulin glargine initiated during this admission. ?-Discontinue insuline glargine 8 units ?-Continue SSI while inpatient ?-On discharge, recommendation to continue home metformin and glipizide. No insulin on discharge. Patient can discuss with PCP regarding addition of addition oral therapies vs continued diet modification ?

## 2021-05-21 NOTE — Progress Notes (Signed)
GYO Progress Note ? ?Subjective: ?Patient reports feeling much better.  Was able to get some sleep overnight.  Has a little bit of upper back soreness secondary to shakes yesterday during her febrile episodes.  Denies significant abdominal or pelvic pain.  Continues to have flatus.  Had bowel movement after CT scan yesterday.  Voiding freely.  Did not ambulate much yesterday, denies any dizziness or significant fatigue. ? ?Objective: ?Vital signs in last 24 hours: ?Temp:  [97.9 ?F (36.6 ?C)-102 ?F (38.9 ?C)] 99.5 ?F (37.5 ?C) (03/04 0154) ?Pulse Rate:  [72-89] 83 (03/04 0154) ?Resp:  [15-18] 18 (03/04 0154) ?BP: (106-134)/(55-65) 134/63 (03/04 0154) ?SpO2:  [96 %-100 %] 99 % (03/04 0154) ?Last BM Date : 05/20/21 ? ?Intake/Output from previous day: ?03/03 0701 - 03/04 0700 ?In: 3250.7 [P.O.:1560; I.V.:1552.3; IV Piggyback:138.4] ?Out: 4600 [Urine:4600] ? ?Physical Examination: ?General: No acute distress, alert and oriented ?Cardiovascular: Regular rate and rhythm, no murmurs rubs or gallops appreciated ?Pulmonary: Clear to auscultation bilaterally no wheezes or rhonchi ?Abdomen: Soft, mildly distended, nontender to palpation, excellent bowel sounds, incisions are clean dry and intact with Dermabond in place ?Extremities: Warm and well perfused no edema or calf pain ? ?Labs: ?CBC ?   ?Component Value Date/Time  ? WBC 4.6 05/21/2021 0411  ? RBC 2.69 (L) 05/21/2021 0411  ? HGB 7.4 (L) 05/21/2021 0411  ? HGB 10.5 (L) 03/29/2021 1210  ? HGB 9.3 (L) 11/11/2020 1545  ? HCT 24.8 (L) 05/21/2021 0411  ? HCT 28.5 (L) 11/11/2020 1545  ? PLT 401 (H) 05/21/2021 0411  ? PLT 438 (H) 03/29/2021 1210  ? PLT 430 11/11/2020 1545  ? MCV 92.2 05/21/2021 0411  ? MCV 87 11/11/2020 1545  ? MCH 27.5 05/21/2021 0411  ? MCHC 29.8 (L) 05/21/2021 0411  ? RDW 19.7 (H) 05/21/2021 0411  ? RDW 19.1 (H) 11/11/2020 1545  ? LYMPHSABS 0.6 (L) 05/20/2021 0549  ? LYMPHSABS 1.9 11/11/2020 1545  ? MONOABS 0.3 05/20/2021 0549  ? EOSABS 0.0 05/20/2021 0549   ? EOSABS 0.1 11/11/2020 1545  ? BASOSABS 0.0 05/20/2021 0549  ? BASOSABS 0.0 11/11/2020 1545  ? ?BMP Latest Ref Rng & Units 05/21/2021 05/20/2021 05/20/2021  ?Glucose 70 - 99 mg/dL 207(H) 194(H) 227(H)  ?BUN 6 - 20 mg/dL _0 ?Creatinine 0.44 - 1.00 mg/dL 0.51 0.73 0.53  ?Sodium 135 - 145 mmol/L 132(L) 134(L) 131(L)  ?Potassium 3.5 - 5.1 mmol/L 3.7 3.6 3.9  ?Chloride 98 - 111 mmol/L 103 103 101  ?CO2 22 - 32 mmol/L 21(L) 22 20(L)  ?Calcium 8.9 - 10.3 mg/dL 8.2(L) 8.4(L) 7.9(L)  ? ?Lactate this morning: 1.1 ? ?Assessment: ? ?51 y.o. s/p total robotic hysterectomy with bilateral salpingectomy on 3/1 in the setting of a large prolapsing uterine versus endocervical fibroid status postembolization 9 days before surgery found to have necrotic deflated fibroid on the day of surgery now admitted with fever that began approximately 16 hours after surgery. ? ?Pain: Well controlled, continue oral medications.  No Tylenol ordered, will order as needed for any fevers. ? ?Postop: Patient meeting all milestones.  Continue routine postoperative care. ? ?ID: I suspect that there was an infection already present in the setting of her embolization and necrosis of her intrauterine or intracervical fibroid.  Awaiting Gram stain on her pathology specimen.  Unfortunately, because it was placed in formalin, culture could not be performed. ?CT scan performed yesterday given fevers after more than 24 hours of IV antibiotics.  No focal findings, postoperative  findings expected.  Chest x-ray negative for any pulmonary infection.  Respiratory panel negative.  Urine without signs of infection.  Blood cultures in process, currently reported as no growth at less than 24 hours.  Patient has been on Zosyn since day of presentation, on 3/2.  We will plan to continue IV antibiotics until she is at least 24 hours without a fever.  Last fever on 3/3 at 1600.  Plan will be to transition to oral antibiotics and monitor for 24 hours.  If she remains afebrile  and continues to do well clinically, will discharge home to complete course of oral antibiotics. ? ?Acute on chronic asymptomatic anemia: Patient's hemoglobin is down to 7.4 this morning.  I suspect that this is in the setting of hemodilution.  She is asymptomatic, but we discussed that if she becomes dizzy or has other symptoms when ambulating, we will plan to transfuse 1 unit of packed red blood cells.  We will continue to follow her CBC.  ? ?Endocrine: Glucose remains uncontrolled, likely in the setting of current infection although the patient has history of uncontrolled glucose.  Diabetic educator met with the patient yesterday.  Metformin is being held in the setting of her lactic acidosis which has resolved.  Plan to restart on discharge.  Continue with moderate insulin sliding scale.  Appreciate medicine's recommendations. ? ?FEN: We will decrease fluids today to 50 cc/hour.  Continues to have some hyponatremia, asymptomatic.  Encouraged normal p.o. intake.  Continue diabetic diet. ? ?Prophylaxis: Lovenox, ambulation. ? ?Plan: ?Patient is progressing well, anticipate discharge in the next 1-3 days. ?The patient is to be discharged to home. ? ? LOS: 1 day  ? ? ?Crystal Vasquez ?05/21/2021, 6:13 AM ? ? ? ?  ? ?

## 2021-05-21 NOTE — Hospital Course (Addendum)
Crystal Vasquez is a 51 y.o. female with a history of hypertension, diabetes mellitus, anemia that presented secondary to fever and nausea in the post-op period. Patient was admitted by gynecology oncology service for workup of post-op fever. During admission, patient developed persistently moderate hyperglycemia and medicine service was consulted for management. Patient is managed on SSI and insuline glargine initiated as well. ?

## 2021-05-22 LAB — CBC
HCT: 25.1 % — ABNORMAL LOW (ref 36.0–46.0)
Hemoglobin: 7.6 g/dL — ABNORMAL LOW (ref 12.0–15.0)
MCH: 28.3 pg (ref 26.0–34.0)
MCHC: 30.3 g/dL (ref 30.0–36.0)
MCV: 93.3 fL (ref 80.0–100.0)
Platelets: 363 10*3/uL (ref 150–400)
RBC: 2.69 MIL/uL — ABNORMAL LOW (ref 3.87–5.11)
RDW: 19.3 % — ABNORMAL HIGH (ref 11.5–15.5)
WBC: 5.1 10*3/uL (ref 4.0–10.5)
nRBC: 0 % (ref 0.0–0.2)

## 2021-05-22 LAB — MAGNESIUM: Magnesium: 1.8 mg/dL (ref 1.7–2.4)

## 2021-05-22 LAB — BASIC METABOLIC PANEL
Anion gap: 8 (ref 5–15)
BUN: 9 mg/dL (ref 6–20)
CO2: 22 mmol/L (ref 22–32)
Calcium: 8.5 mg/dL — ABNORMAL LOW (ref 8.9–10.3)
Chloride: 105 mmol/L (ref 98–111)
Creatinine, Ser: 0.63 mg/dL (ref 0.44–1.00)
GFR, Estimated: >60 mL/min (ref >60)
Glucose, Bld: 168 mg/dL — ABNORMAL HIGH (ref 70–99)
Potassium: 3.9 mmol/L (ref 3.5–5.1)
Sodium: 135 mmol/L (ref 135–145)

## 2021-05-22 LAB — PHOSPHORUS: Phosphorus: 4.2 mg/dL (ref 2.5–4.6)

## 2021-05-22 LAB — GLUCOSE, CAPILLARY
Glucose-Capillary: 168 mg/dL — ABNORMAL HIGH (ref 70–99)
Glucose-Capillary: 290 mg/dL — ABNORMAL HIGH (ref 70–99)
Glucose-Capillary: 186 mg/dL — ABNORMAL HIGH (ref 70–99)
Glucose-Capillary: 138 mg/dL — ABNORMAL HIGH (ref 70–99)

## 2021-05-22 LAB — LACTIC ACID, PLASMA: Lactic Acid, Venous: 1 mmol/L (ref 0.5–1.9)

## 2021-05-22 MED ORDER — LORATADINE 10 MG PO TABS
10.0000 mg | ORAL_TABLET | Freq: Every day | ORAL | Status: DC
  Administered 2021-05-22 – 2021-05-23 (×2): 10 mg via ORAL
  Filled 2021-05-22 (×2): qty 1

## 2021-05-22 MED ORDER — FERROUS SULFATE 325 (65 FE) MG PO TABS
325.0000 mg | ORAL_TABLET | Freq: Every day | ORAL | Status: DC
  Administered 2021-05-22 – 2021-05-23 (×2): 325 mg via ORAL
  Filled 2021-05-22 (×2): qty 1

## 2021-05-22 MED ORDER — AMOXICILLIN-POT CLAVULANATE 875-125 MG PO TABS
1.0000 | ORAL_TABLET | Freq: Two times a day (BID) | ORAL | Status: DC
  Administered 2021-05-22 – 2021-05-23 (×3): 1 via ORAL
  Filled 2021-05-22 (×3): qty 1

## 2021-05-22 NOTE — Progress Notes (Signed)
GYo Progress Note ? ?Subjective: ?Patient reports feeling well.  She had some minimal shakes/feeling feverish overnight, much improved.  Nausea that she had last night has resolved, she thinks it is related to sinus drainage from allergies.  Feels congested with some sinus pain.  Denies any emesis.  Reports regular bladder function.  Having some loose stools although consistency is improving.  Ambulated in the hall 4 times yesterday.  Denies any dizziness. ? ?Objective: ?Vital signs in last 24 hours: ?Temp:  [98.4 ?F (36.9 ?C)-99.5 ?F (37.5 ?C)] 98.4 ?F (36.9 ?C) (03/05 1224) ?Pulse Rate:  [71-83] 73 (03/05 8250) ?Resp:  [16-18] 18 (03/05 0370) ?BP: (119-142)/(70-80) 122/80 (03/05 4888) ?SpO2:  [98 %-100 %] 100 % (03/05 9169) ?Last BM Date : 05/20/21 ? ?Intake/Output from previous day: ?03/04 0701 - 03/05 0700 ?In: 2730.2 [P.O.:1560; I.V.:1020.2; IV Piggyback:150] ?Out: 2250 [Urine:2250] ? ?Physical Examination: ?General: No acute distress, alert and oriented ?Cardiovascular: Regular rate and rhythm, no murmurs rubs or gallops appreciated ?Pulmonary: Clear to auscultation bilaterally no wheezes or rhonchi ?Abdomen: Soft, non distended, nontender to palpation, excellent bowel sounds, incisions are clean dry and intact with Dermabond in place ?Extremities: Warm and well perfused no edema or calf pain ? ?Labs: ?CBC ?   ?Component Value Date/Time  ? WBC 5.1 05/22/2021 0415  ? RBC 2.69 (L) 05/22/2021 0415  ? HGB 7.6 (L) 05/22/2021 0415  ? HGB 10.5 (L) 03/29/2021 1210  ? HGB 9.3 (L) 11/11/2020 1545  ? HCT 25.1 (L) 05/22/2021 0415  ? HCT 28.5 (L) 11/11/2020 1545  ? PLT 363 05/22/2021 0415  ? PLT 438 (H) 03/29/2021 1210  ? PLT 430 11/11/2020 1545  ? MCV 93.3 05/22/2021 0415  ? MCV 87 11/11/2020 1545  ? MCH 28.3 05/22/2021 0415  ? MCHC 30.3 05/22/2021 0415  ? RDW 19.3 (H) 05/22/2021 0415  ? RDW 19.1 (H) 11/11/2020 1545  ? LYMPHSABS 0.6 (L) 05/20/2021 0549  ? LYMPHSABS 1.9 11/11/2020 1545  ? MONOABS 0.3 05/20/2021 0549  ?  EOSABS 0.0 05/20/2021 0549  ? EOSABS 0.1 11/11/2020 1545  ? BASOSABS 0.0 05/20/2021 0549  ? BASOSABS 0.0 11/11/2020 1545  ? ?BMP Latest Ref Rng & Units 05/22/2021 05/21/2021 05/20/2021  ?Glucose 70 - 99 mg/dL 168(H) 207(H) 194(H)  ?BUN 6 - 20 mg/dL _0 ?Creatinine 0.44 - 1.00 mg/dL 0.63 0.51 0.73  ?Sodium 135 - 145 mmol/L 135 132(L) 134(L)  ?Potassium 3.5 - 5.1 mmol/L 3.9 3.7 3.6  ?Chloride 98 - 111 mmol/L 105 103 103  ?CO2 22 - 32 mmol/L 22 21(L) 22  ?Calcium 8.9 - 10.3 mg/dL 8.5(L) 8.2(L) 8.4(L)  ? ?Lactate 1.0 ? ? ?Assessment: ? ?51 y.o. s/p total robotic hysterectomy with bilateral salpingectomy on 3/1 in the setting of a large prolapsing uterine versus endocervical fibroid status postembolization 9 days before surgery found to have necrotic deflated fibroid on the day of surgery now admitted with fever that began approximately 16 hours after surgery. ?  ?Pain: Well controlled, continue oral medications.   ?  ?Postop: Patient meeting all milestones.  Continue routine postoperative care. ?  ?ID: I suspect that there was an infection already present in the setting of her embolization and necrosis of her intrauterine or intracervical fibroid.  Awaiting Gram stain on her pathology specimen.  CT scan performed 3/3 given fevers after more than 24 hours of IV antibiotics.  No focal findings, postoperative findings expected.  Chest x-ray negative for any pulmonary infection.  Respiratory panel negative.  Urine  without signs of infection.  Blood cultures NGTD at 48 hours.  Patient has been on Zosyn since day of presentation, on 3/2.  Last fever on 3/3 at 1600.  Gien nausea last night, delayed conversion to PO antibiotics until this morning, plan to transition to Augmentin.  If she remains afebrile and continues to do well clinically, will discharge home to complete course of oral antibiotics. ?  ?Acute on chronic asymptomatic anemia: Patient's hemoglobin is stable, 7.6 this morning.  She is asymptomatic, but we discussed  that if she becomes dizzy or has other symptoms when ambulating, we will plan to transfuse 1 unit of packed red blood cells.  We will continue to follow her CBC. Iron ordered and will be prescribed on discharge. ?  ?Endocrine: Glucose remains uncontrolled, likely in the setting of current infection although the patient has history of uncontrolled glucose.  Glucose improving; cbgs now < 200. Diabetic educator met with the patient on 3/3.  Metformin is being held in the setting of her lactic acidosis which has resolved.  Plan to restart on discharge.  Continue with moderate insulin sliding scale.  Appreciate medicine's recommendations. ?  ?FEN: DC IVFs.  Continue diabetic diet. ?  ?Prophylaxis: Lovenox, ambulation. ? ?Plan: ?Likely discharge late today versus tomorrow. ?The patient is to be discharged to home. ? ? LOS: 2 days  ? ? ?Crystal Vasquez ?05/22/2021, 8:41 AM ? ? ? ?  ? ?

## 2021-05-22 NOTE — Progress Notes (Signed)
PROGRESS NOTE    Crystal Vasquez  TZG:[id number] DOB: 1970-10-28 DOA: 05/19/2021 PCP: Camillia Herter, NP   Brief Narrative: Crystal Vasquez is a 51 y.o. female with a history of hypertension, diabetes mellitus, anemia that presented secondary to fever and nausea in the post-op period. Patient was admitted by gynecology oncology service for workup of post-op fever. During admission, patient developed persistently moderate hyperglycemia and medicine service was consulted for management. Patient is managed on SSI and insuline glargine initiated as well.   Assessment and Plan: * Fever postop Tmax of 102 F on 3/3. Thorough fever workup initiated per primary service with no current positive results. Patient was on empiric Zosyn IV and is now transitioned to Augmentin. Per primary service.  Uncontrolled type 2 diabetes mellitus with hyperglycemia (HCC) Hemoglobin A1C of 7.2% as an outpatient, just above goal of <7%. Patient is managed on metformin and glipizide as an outpatient. This admission, blood sugar ranging in 200s. Likely related to receiving dexamethasone, which may keep blood sugar elevated for a few days. Also in setting of possible infection. Managed on Novolog SSI inpatient. Insulin glargine initiated during this admission. -Discontinue insuline glargine 8 units -Continue SSI while inpatient -On discharge, recommendation to continue home metformin and glipizide. No insulin on discharge. Patient can discuss with PCP regarding addition of addition oral therapies vs continued diet modification  Hypertension Mostly well controlled. -Continue home metoprolol, amlodipine and hydrochlorothiazide -No recommendations for medication changes at discharge at this time  Iron deficiency anemia due to chronic blood loss Acute on chronic with hemoglobin of 7.6 this morning and is stable. Addressed by primary team who will consider blood transfusion if patient becomes  symptomatic.   Discussed with primary team this morning. No further recommendation from medicine service. Medicine will sign off at this time. Please re-consult if we are needed for further recommendations.   DVT prophylaxis: Per primary Code Status:   Code Status: Full Code Family Communication: None at bedside Disposition Plan: Per primary   Procedures:  None  Antimicrobials: Zosyn IV  Augmentin   Subjective: No issues this morning.  Objective: BP 122/80 (BP Location: Left Arm)    Pulse 73    Temp 98.4 F (36.9 C) (Oral)    Resp 18    Ht '5\' 6"'$  (1.676 m)    Wt 79.4 kg    LMP 04/27/2021 (Exact Date)    SpO2 100%    BMI 28.25 kg/m   Examination:  General exam: Appears calm and comfortable    Data Reviewed: I have personally reviewed following labs and imaging studies  CBC Lab Results  Component Value Date   WBC 5.1 05/22/2021   RBC 2.69 (L) 05/22/2021   HGB 7.6 (L) 05/22/2021   HCT 25.1 (L) 05/22/2021   MCV 93.3 05/22/2021   MCH 28.3 05/22/2021   PLT 363 05/22/2021   MCHC 30.3 05/22/2021   RDW 19.3 (H) 05/22/2021   LYMPHSABS 0.6 (L) 05/20/2021   MONOABS 0.3 05/20/2021   EOSABS 0.0 05/20/2021   BASOSABS 0.0 75/91/6384     Last metabolic panel Lab Results  Component Value Date   NA 135 05/22/2021   K 3.9 05/22/2021   CL 105 05/22/2021   CO2 22 05/22/2021   BUN 9 05/22/2021   CREATININE 0.63 05/22/2021   GLUCOSE 168 (H) 05/22/2021   GFRNONAA >60 05/22/2021   CALCIUM 8.5 (L) 05/22/2021   PHOS 4.2 05/22/2021   PROT 6.6 05/19/2021   ALBUMIN 3.2 (L) 05/19/2021  BILITOT <0.1 (L) 05/19/2021   ALKPHOS 55 05/19/2021   AST 18 05/19/2021   ALT 15 05/19/2021   ANIONGAP 8 05/22/2021    GFR: Estimated Creatinine Clearance: 88.4 mL/min (by C-G formula based on SCr of 0.63 mg/dL).  Recent Results (from the past 240 hour(s))  Culture, blood (routine x 2)     Status: None (Preliminary result)   Collection Time: 05/19/21 10:37 AM   Specimen: BLOOD  Result  Value Ref Range Status   Specimen Description   Final    BLOOD RIGHT ANTECUBITAL Performed at [facility] [address], [place], [place] [address]    Special Requests   Final    BOTTLES DRAWN AEROBIC AND ANAEROBIC Blood Culture results may not be optimal due to an excessive volume of blood received in culture bottles Performed at [facility] [address], [place], [place] [address]    Culture   Final    NO GROWTH 3 DAYS Performed at [facility], [place] [address] [place], [place] [address]    Report Status PENDING  Incomplete  Culture, blood (routine x 2)     Status: None (Preliminary result)   Collection Time: 05/19/21 11:03 AM   Specimen: BLOOD  Result Value Ref Range Status   Specimen Description   Final    BLOOD BLOOD LEFT HAND Performed at [facility] [address] [place], [place] [address]    Special Requests   Final    BOTTLES DRAWN AEROBIC AND ANAEROBIC Blood Culture adequate volume Performed at [facility] [address], [place], [place] [address]    Culture   Final    NO GROWTH 3 DAYS Performed at [facility], [place] [address] [place], [place] [address]    Report Status PENDING  Incomplete  Respiratory (~20 pathogens) panel by PCR     Status: None   Collection Time: 05/20/21 10:05 AM   Specimen: Nasopharyngeal Swab; Respiratory  Result Value Ref Range Status   Adenovirus NOT DETECTED NOT DETECTED Final   Coronavirus 229E NOT DETECTED NOT DETECTED Final    Comment: (NOTE) The Coronavirus on the Respiratory Panel, DOES NOT test for the novel  Coronavirus (2019 nCoV)    Coronavirus HKU1 NOT DETECTED NOT DETECTED Final   Coronavirus NL63 NOT DETECTED NOT DETECTED Final   Coronavirus OC43 NOT DETECTED NOT DETECTED Final   Metapneumovirus NOT DETECTED NOT DETECTED Final   Rhinovirus / Enterovirus NOT DETECTED NOT DETECTED Final   Influenza A NOT DETECTED NOT  DETECTED Final   Influenza B NOT DETECTED NOT DETECTED Final   Parainfluenza Virus 1 NOT DETECTED NOT DETECTED Final   Parainfluenza Virus 2 NOT DETECTED NOT DETECTED Final   Parainfluenza Virus 3 NOT DETECTED NOT DETECTED Final   Parainfluenza Virus 4 NOT DETECTED NOT DETECTED Final   Respiratory Syncytial Virus NOT DETECTED NOT DETECTED Final   Bordetella pertussis NOT DETECTED NOT DETECTED Final   Bordetella Parapertussis NOT DETECTED NOT DETECTED Final   Chlamydophila pneumoniae NOT DETECTED NOT DETECTED Final   Mycoplasma pneumoniae NOT DETECTED NOT DETECTED Final    Comment: Performed at [facility], [place]. [address] [place], [place] [address]      Radiology Studies: CT ABDOMEN PELVIS W CONTRAST  Addendum Date: 05/20/2021   ADDENDUM REPORT: 05/20/2021 20:53 ADDENDUM: Findings were reported to patient's nurse Angelica at 3:15 p.m. Electronically Signed   By: Brett Fairy M.D.   On: 05/20/2021  20:53   Result Date: 05/20/2021 CLINICAL DATA:  Postop for necrotic prolapsing fibroid, sepsis. EXAM: CT ABDOMEN AND PELVIS WITH CONTRAST TECHNIQUE: Multidetector CT imaging of the abdomen and pelvis was performed using the standard protocol following bolus administration of intravenous contrast. RADIATION DOSE REDUCTION: This exam was performed according to the departmental dose-optimization program which includes automated exposure control, adjustment of the mA and/or kV according to patient size and/or use of iterative reconstruction technique. CONTRAST:  138m OMNIPAQUE IOHEXOL 300 MG/ML  SOLN COMPARISON:  11/15/2020. FINDINGS: Lower chest: Strandy opacities are present at the lung bases. There are trace bilateral pleural effusions, greater on the right than on the left. Hepatobiliary: No focal liver abnormality is seen. No gallstones, gallbladder wall thickening, or biliary dilatation. Pancreas: Unremarkable. No pancreatic ductal dilatation or surrounding inflammatory changes. Spleen: Normal  in size without focal abnormality. Adrenals/Urinary Tract: No adrenal nodule or mass. A nonobstructive left renal calculus is noted. The kidneys enhance symmetrically. No obstructive uropathy bilaterally. The bladder is unremarkable. Stomach/Bowel: The stomach is within normal limits. No bowel obstruction or pneumatosis. Scattered diverticula are present along the colon without evidence of diverticulitis. A normal appendix is seen in the right lower quadrant. No focal bowel wall thickening. Vascular/Lymphatic: No significant vascular findings are present. No enlarged abdominal or pelvic lymph nodes. Reproductive: Presumed hysterectomy changes are present in the pelvis. No adnexal mass is seen. There is fat stranding and a small amount of free fluid in the surgical bed. No abscess is identified. Scattered free air is noted in the abdomen and pelvis, likely within normal limits for postoperative status. Other: Small fat containing umbilical hernia. Musculoskeletal: Air is identified in the at tumoral abdominal wall bilaterally, greater on the left than on the right and is likely related to recent surgery. Degenerative changes in the thoracolumbar spine. No acute osseous abnormality. IMPRESSION: 1. Status post hysterectomy with fat stranding and a small amount of free fluid in the surgical bed. No abscess is seen. Free air is identified in the abdomen and pelvis which is likely related to patient's region surgery. 2. Strandy opacities at the lung bases with trace bilateral pleural effusions, possible atelectasis or infiltrate. 3. Nonobstructive left renal calculus. 4. Diverticulosis without diverticulitis. Electronically Signed: By: LBrett FairyM.D. On: 05/20/2021 20:30      LOS: 2 days    RCordelia Poche MD [facility] 05/22/2021, 12:12 PM   If 7PM-7AM, please contact night-coverage [email or web address]

## 2021-05-23 LAB — CBC
HCT: 26.5 % — ABNORMAL LOW (ref 36.0–46.0)
Hemoglobin: 8 g/dL — ABNORMAL LOW (ref 12.0–15.0)
MCH: 28.6 pg (ref 26.0–34.0)
MCHC: 30.2 g/dL (ref 30.0–36.0)
MCV: 94.6 fL (ref 80.0–100.0)
Platelets: 397 10*3/uL (ref 150–400)
RBC: 2.8 MIL/uL — ABNORMAL LOW (ref 3.87–5.11)
RDW: 19.5 % — ABNORMAL HIGH (ref 11.5–15.5)
WBC: 5.4 10*3/uL (ref 4.0–10.5)
nRBC: 0.4 % — ABNORMAL HIGH (ref 0.0–0.2)

## 2021-05-23 LAB — BASIC METABOLIC PANEL
Anion gap: 11 (ref 5–15)
BUN: 10 mg/dL (ref 6–20)
CO2: 22 mmol/L (ref 22–32)
Calcium: 9 mg/dL (ref 8.9–10.3)
Chloride: 104 mmol/L (ref 98–111)
Creatinine, Ser: 0.54 mg/dL (ref 0.44–1.00)
GFR, Estimated: >60 mL/min (ref >60)
Glucose, Bld: 136 mg/dL — ABNORMAL HIGH (ref 70–99)
Potassium: 4.4 mmol/L (ref 3.5–5.1)
Sodium: 137 mmol/L (ref 135–145)

## 2021-05-23 LAB — GLUCOSE, CAPILLARY
Glucose-Capillary: 183 mg/dL — ABNORMAL HIGH (ref 70–99)
Glucose-Capillary: 189 mg/dL — ABNORMAL HIGH (ref 70–99)

## 2021-05-23 LAB — SURGICAL PATHOLOGY

## 2021-05-23 MED ORDER — AMOXICILLIN-POT CLAVULANATE 875-125 MG PO TABS: 1.0000 | ORAL_TABLET | Freq: Two times a day (BID) | ORAL | 0 refills | Status: AC

## 2021-05-23 NOTE — Discharge Summary (Signed)
?Physician Discharge Summary  ?Patient ID: ?Crystal Vasquez ?MRN: [id number] ?DOB/AGE: [birth year] 51 y.o. ? ?Admit date: 05/19/2021 ?Discharge date: 05/23/2021 ? ?Admission Diagnoses: Fever postop ? ?Discharge Diagnoses:  ?Principal Problem: ?  Fever postop ?Active Problems: ?  Iron deficiency anemia due to chronic blood loss ?  Hypertension ?  Uncontrolled type 2 diabetes mellitus with hyperglycemia ([place]) ? ? ?Discharged Condition:  The patient is in good condition and stable for discharge.   ? ?Hospital Course: Crystal Vasquez is a 51 y.o. female s/p total robotic hysterectomy with bilateral salpingectomy on 3/1 in the setting of a large prolapsing uterine versus endocervical fibroid status postembolization 9 days before surgery found to have necrotic deflated fibroid on the day of surgery admitted as of 05/19/2021 with fever that began approximately 16 hours after surgery. There was an infection already present at the time of surgery in the setting of her embolization and necrosis of her intrauterine or intracervical fibroid.  Awaiting Gram stain on her pathology specimen ordered 05/20/21.  ? ?CT scan performed 3/3 given fevers after more than 24 hours of IV antibiotics with no focal findings, postoperative findings expected.  Chest x-ray negative for any pulmonary infection.  Respiratory panel negative.  Urine without signs of infection.  Blood cultures NGTD after 4 days.  Patient was started on Zosyn since day of presentation, on 3/2.  Last fever on 3/3 at 1600. She was transitioned to oral Augmentin on 05/22/21 and has tolerated this well and remained afebrile with white blood cell count of 5.4 this am. She was discharged home on 05/23/2021 with no pain, afebrile, passing flatus and having bowel movements, tolerating diet with an additional seven days of Augmentin prescribed. ? ?Consults: Hospitalist, Pharmacy for zosyn ? ?Significant Diagnostic Studies: Labs, CT ? ?Treatments: IV hydration, IV antibiotic (zosyn),  insulin, electrolyte replacement ? ?Discharge Exam: ?Blood pressure 138/75, pulse (!) 59, temperature 99 ?F (37.2 ?C), temperature source Oral, resp. rate 16, height '5\' 6"'$  (1.676 m), weight 175 lb (79.4 kg), last menstrual period 04/27/2021, SpO2 100 %. ?General appearance: alert, cooperative, and no distress ?Resp: clear to auscultation bilaterally ?Cardio: regular rate and rhythm, S1, S2 normal, no murmur, click, rub or gallop ?GI: soft, non-tender; bowel sounds normal; no masses,  no organomegaly ?Extremities: extremities normal, atraumatic, no cyanosis or edema ?Incision/Wound: lap sites to the abdomen with dermabond intact without erythema or drainage. ? ?Disposition:  ?Discharge disposition: 01-Home or Self Care ? ? ? ? ? ? ?Discharge Instructions   ? ? Call MD for:  difficulty breathing, headache or visual disturbances   Complete by: As directed ?  ? Call MD for:  extreme fatigue   Complete by: As directed ?  ? Call MD for:  hives   Complete by: As directed ?  ? Call MD for:  persistant dizziness or light-headedness   Complete by: As directed ?  ? Call MD for:  persistant nausea and vomiting   Complete by: As directed ?  ? Call MD for:  redness, tenderness, or signs of infection (pain, swelling, redness, odor or green/yellow discharge around incision site)   Complete by: As directed ?  ? Call MD for:  severe uncontrolled pain   Complete by: As directed ?  ? Call MD for:  temperature >100.4   Complete by: As directed ?  ? Diet - low sodium heart healthy   Complete by: As directed ?  ? Driving Restrictions   Complete by: As directed ?  ? No  driving for around 1 week from surgery.  Do not take narcotics and drive. You need to make sure your reaction time has returned.  ? Increase activity slowly   Complete by: As directed ?  ? Lifting restrictions   Complete by: As directed ?  ? No lifting greater than 10 lbs, pushing, pulling, straining for 6 weeks from surgery.  ? Sexual Activity Restrictions   Complete by: As  directed ?  ? No sexual activity, nothing in the vagina, for 8 weeks from surgery.  ? ?  ? ?Allergies as of 05/23/2021   ?No Known Allergies ?  ? ?  ?Medication List  ?  ? ?STOP taking these medications   ? ?medroxyPROGESTERone 10 MG tablet ?Commonly known as: Provera ?  ? ?  ? ?TAKE these medications   ? ?acetaminophen 325 MG tablet ?Commonly known as: TYLENOL ?Take 650 mg by mouth every 6 (six) hours as needed for moderate pain. ?  ?amLODipine 10 MG tablet ?Commonly known as: NORVASC ?Take 1 tablet (10 mg total) by mouth daily. ?  ?amoxicillin-clavulanate 875-125 MG tablet ?Commonly known as: AUGMENTIN ?Take 1 tablet by mouth every 12 (twelve) hours for 7 days. ?  ?docusate sodium 100 MG capsule ?Commonly known as: Colace ?Take 1 capsule (100 mg total) by mouth 2 (two) times daily for 14 days. ?  ?ferrous sulfate 325 (65 FE) MG tablet ?Take 325 mg by mouth in the morning. ?  ?fluticasone 27.5 MCG/SPRAY nasal spray ?Commonly known as: VERAMYST ?Place 1 spray into the nose in the morning. ?  ?glipiZIDE 10 MG tablet ?Commonly known as: GLUCOTROL ?Take 1 tablet (10 mg total) by mouth 2 (two) times daily before a meal. ?  ?hydrochlorothiazide 25 MG tablet ?Commonly known as: HYDRODIURIL ?Take 1 tablet (25 mg total) by mouth daily. ?  ?ibuprofen 200 MG tablet ?Commonly known as: ADVIL ?Take 400 mg by mouth every 6 (six) hours as needed (pain). ?  ?lisinopril 40 MG tablet ?Commonly known as: ZESTRIL ?Take 1 tablet (40 mg total) by mouth daily. ?  ?metFORMIN 1000 MG tablet ?Commonly known as: GLUCOPHAGE ?Take 1 tablet (1,000 mg total) by mouth 2 (two) times daily with a meal. ?  ?metoprolol succinate 25 MG 24 hr tablet ?Commonly known as: TOPROL-XL ?Take 1 tablet (25 mg total) by mouth daily. ?  ?omeprazole 20 MG tablet ?Commonly known as: PRILOSEC OTC ?Take 20 mg by mouth every morning. ?  ?oxyCODONE 5 MG immediate release tablet ?Commonly known as: Oxy IR/ROXICODONE ?Take 5 mg by mouth every 6 (six) hours as needed for  severe pain. ?  ?senna-docusate 8.6-50 MG tablet ?Commonly known as: Senokot-S ?Take 2 tablets by mouth at bedtime. For AFTER surgery, do not take if having diarrhea ?  ?traMADol 50 MG tablet ?Commonly known as: ULTRAM ?Take 1 tablet (50 mg total) by mouth every 6 (six) hours as needed for severe pain. For AFTER surgery only, do not take and drive ?  ? ?  ? ? Follow-up Information   ? ? Lafonda Mosses, MD Follow up on 05/30/2021.   ?Specialty: Gynecologic Oncology ?Why: at 4:05pm will be a PHONE visit with Dr. Berline Lopes to check in . IN PERSON visit will be on 06/09/21 at 2:15pm at the [facility]. ?Contact information: ?[address] ?[place] [address] ?[phone] ? ? ?  ?  ? ?  ?  ? ?  ? ? ?Greater than thirty minutes were spend for face to face discharge instructions  and discharge orders/summary in EPIC.  ? ?Signed: ?Kajal Scalici D Shyah Cadmus ?05/23/2021, 12:13 PM ? ? ? ?  ?

## 2021-05-23 NOTE — Discharge Instructions (Addendum)
05/23/2021 ? ?YOU WILL NEED TO CONTINUE TAKING AUGMENTIN FOR ANOTHER 7 DAYS. PLEASE CALL IF NOT PROGRESSING AND OR FEELING POORLY (example: Fever returns). ? ?DO NOT TAKE TRAMADOL AND OXYCODONE TOGETHER. USE ONE OR THE OTHER FOR MODERATE PAIN AS NEEDED. ? ?YOU NEED TO CONTINUE TAKING DAILY IRON. ? ?Return to work: 4-6 weeks if applicable ? ?Activity: ?1. Be up and out of the bed during the day.  Take a nap if needed.  You may walk up steps but be careful and use the hand rail.  Stair climbing will tire you more than you think, you may need to stop part way and rest.  ? ?2. No lifting or straining over 10 lbs, pushing, pulling, straining for 6 weeks. ? ?3. No driving for around 1 week(s) from surgery.  Do not drive if you are taking narcotic pain medicine. You need to make sure your reaction time has returned and you can brake safely. ? ?4. Shower daily.  Use your regular soap to bathe and when finished pat your incision dry; don't rub.  No tub baths until cleared by your surgeon.  ? ?5. No sexual activity and nothing in the vagina for 8 weeks from surgery. ? ?6. You may experience a small amount of clear drainage from your incisions, which is normal.  If the drainage persists or increases, please call the office. ? ?7. You may experience vaginal spotting after surgery or around the 6-8 week mark from surgery when the stitches at the top of the vagina begin to dissolve.  The spotting is normal but if you experience heavy bleeding, call our office. ? ?8. Take Tylenol or ibuprofen first for pain and only use narcotic pain medication for severe pain not relieved by the Tylenol or Ibuprofen.  Monitor your Tylenol intake to a max of 4,000 mg.  ? ?Diet: ?1. Low sodium Heart Healthy Diet is recommended. ? ?2. It is safe to use a laxative, such as Miralax or Colace, if you have difficulty moving your bowels. You can take Sennakot at bedtime every evening to keep bowel movements regular and to prevent constipation.   ? ?Wound  Care: ?1. Keep clean and dry.  Shower daily. ? ?Reasons to call the Doctor: ?Fever - Oral temperature greater than 100.4 degrees Fahrenheit ?Foul-smelling vaginal discharge ?Difficulty urinating ?Nausea and vomiting ?Increased pain at the site of the incision that is unrelieved with pain medicine. ?Difficulty breathing with or without chest pain ?New calf pain especially if only on one side ?Sudden, continuing increased vaginal bleeding with or without clots. ?  ?Contacts: ?For questions or concerns you should contact: ? ?Dr. Jeral Pinch at [phone] ? ?Joylene John, NP at [phone] ? ?After Hours: call [phone] and have the GYN Oncologist paged/contacted ?  ?

## 2021-05-24 ENCOUNTER — Telehealth: Payer: Self-pay

## 2021-05-24 LAB — CULTURE, BLOOD (ROUTINE X 2)
Culture: NO GROWTH
Culture: NO GROWTH
Special Requests: ADEQUATE

## 2021-05-24 NOTE — Telephone Encounter (Signed)
Transition Care Management Follow-up Telephone Call ?Date of discharge and from where: 05/23/2021, [facility]  ?How have you been since you were released from the hospital? She said she is feeling pretty good. She been up walking trying to increase her endurance.  ?Any questions or concerns? No ? ?Items Reviewed: ?Did the pt receive and understand the discharge instructions provided? Yes  ?Medications obtained and verified? Yes  - she said she has all of her medications and did not have any questions about her med regime.  ?Other? No  ?Any new allergies since your discharge? No  ?Dietary orders reviewed? Yes ?Do you have support at home? Yes  ? ?Home Care and Equipment/Supplies: ?Were home health services ordered? no ?If so, what is the name of the agency? N/a  ?Has the agency set up a time to come to the patient's home? not applicable ?Were any new equipment or medical supplies ordered?  No ?What is the name of the medical supply agency? N/a ?Were you able to get the supplies/equipment? not applicable ?Do you have any questions related to the use of the equipment or supplies? No ? ?Functional Questionnaire: (I = Independent and D = Dependent) ?ADLs: independent ? ? ?Follow up appointments reviewed: ? ?PCP Hospital f/u appt confirmed? Yes  Scheduled to see Durene Fruits, NP - 06/29/2021.  ?[facility] f/u appt confirmed? Yes  Scheduled to see Dr Berline Lopes - 06/09/2021.  ?Are transportation arrangements needed? No  ?If their condition worsens, is the pt aware to call PCP or go to the Emergency Dept.? Yes ?Was the patient provided with contact information for the PCP's office or ED? Yes ?Was to pt encouraged to call back with questions or concerns? Yes ? ?

## 2021-05-25 ENCOUNTER — Telehealth: Payer: Self-pay | Admitting: *Deleted

## 2021-05-25 ENCOUNTER — Inpatient Hospital Stay: Payer: Self-pay | Admitting: Gynecologic Oncology

## 2021-05-25 NOTE — Telephone Encounter (Signed)
Spoke with Crystal Vasquez this morning. She states she is eating, drinking and urinating well. She has had a BM yet and is passing gas. She is taking senokot as prescribed and encouraged her to drink plenty of water. She denies fever or chills. Incisions are dry and intact. She rates her pain 0/10. Her pain is controlled with Tylenol/Ibuprofen.  ? ?Instructed to call office with any fever, chills, purulent drainage, uncontrolled pain or any other questions or concerns. Patient verbalizes understanding.  ? ?Pt made aware of post op appointments as well as the office number [phone] and after hours number [phone] to call if she has any questions or concerns  ? ? ?

## 2021-05-30 ENCOUNTER — Inpatient Hospital Stay: Payer: Self-pay | Attending: Gynecologic Oncology | Admitting: Gynecologic Oncology

## 2021-05-30 DIAGNOSIS — D259 Leiomyoma of uterus, unspecified: Secondary | ICD-10-CM | POA: Insufficient documentation

## 2021-05-30 DIAGNOSIS — R1032 Left lower quadrant pain: Secondary | ICD-10-CM | POA: Insufficient documentation

## 2021-05-30 DIAGNOSIS — E119 Type 2 diabetes mellitus without complications: Secondary | ICD-10-CM | POA: Insufficient documentation

## 2021-05-30 DIAGNOSIS — Z90722 Acquired absence of ovaries, bilateral: Secondary | ICD-10-CM | POA: Insufficient documentation

## 2021-05-30 DIAGNOSIS — R1010 Upper abdominal pain, unspecified: Secondary | ICD-10-CM | POA: Insufficient documentation

## 2021-05-30 DIAGNOSIS — R6889 Other general symptoms and signs: Secondary | ICD-10-CM | POA: Insufficient documentation

## 2021-05-30 DIAGNOSIS — B379 Candidiasis, unspecified: Secondary | ICD-10-CM

## 2021-05-30 DIAGNOSIS — K5900 Constipation, unspecified: Secondary | ICD-10-CM | POA: Insufficient documentation

## 2021-05-30 DIAGNOSIS — Z90710 Acquired absence of both cervix and uterus: Secondary | ICD-10-CM | POA: Insufficient documentation

## 2021-05-30 MED ORDER — FLUCONAZOLE 150 MG PO TABS: 150.0000 mg | ORAL_TABLET | Freq: Every day | ORAL | 0 refills | Status: DC

## 2021-05-30 MED ORDER — NYSTATIN 100000 UNIT/GM EX POWD: 1.0000 "application " | Freq: Three times a day (TID) | CUTANEOUS | 1 refills | Status: DC

## 2021-05-30 NOTE — Progress Notes (Signed)
Gynecologic Oncology Telehealth Consult Note: Gyn-Onc  I connected with Crystal Vasquez on 05/30/21 at  4:05 PM EDT by telephone and verified that I am speaking with the correct person using two identifiers.  I discussed the limitations, risks, security and privacy concerns of performing an evaluation and management service by telemedicine and the availability of in-person appointments. I also discussed with the patient that there may be a patient responsible charge related to this service. The patient expressed understanding and agreed to proceed.  Other persons participating in the visit and their role in the encounter: None.  Patient's location: Home Provider's location: [facility]  Reason for Visit: Follow-up after recent surgery and hospitalization  Treatment History: 05/18/2021: Robotic total hysterectomy with bilateral salpingectomy, contained morcellation.  This was done in the setting of a prolapsing endometrial versus endocervical fibroid after uterine artery embolization.  Final pathology revealed multiple leiomyoma. Patient was readmitted on postoperative day #1 in the setting of fever and malaise.  Infectious work-up was negative including blood culture, chest x-ray, urinalysis and culture and CT scan.  Patient was treated for presumed infection present at the time of surgery secondary to necrotic prolapsing fibroid.  She was treated with Zosyn and ultimately transition to Augmentin which she was discharged home on.  Interval History: Patient reports doing well at home, has completed her antibiotics.  Denies any fevers or chills.  Denies any significant abdominal or pelvic pain.  Has had minimal dark brown vaginal spotting/bleeding.  Reports improving bowel function.  Denies any urinary symptoms.  Has noted a mild rash on her right labia and groin which she thinks is Candida.  Denies any associated pruritus or other symptoms.  Appetite is improving, denies any nausea  or emesis.  Blood sugars have been overall less than 150-200.  Past Medical/Surgical History: Past Medical History:  Diagnosis Date   Anemia    Asthma    due to acid reflux   Depression    DM2    Dyspnea    GERD (gastroesophageal reflux disease)    Headache    Heart murmur    benign   Hypertension    Obesity (BMI 30-39.9)    Thyroid disease     Past Surgical History:  Procedure Laterality Date   BIOPSY THYROID     IR ANGIOGRAM PELVIS SELECTIVE OR SUPRASELECTIVE  05/09/2021   IR ANGIOGRAM SELECTIVE EACH ADDITIONAL VESSEL  05/09/2021   IR AORTAGRAM ABDOMINAL SERIALOGRAM  05/09/2021   IR EMBO TUMOR ORGAN ISCHEMIA INFARCT INC GUIDE ROADMAPPING  05/09/2021   IR RADIOLOGIST EVAL & MGMT  04/11/2021   IR US GUIDE VASC ACCESS LEFT  05/09/2021   ROBOTIC ASSISTED LAPAROSCOPIC HYSTERECTOMY AND SALPINGECTOMY Bilateral 05/18/2021   Procedure: XI ROBOTIC ASSISTED LAPAROSCOPIC HYSTERECTOMY AND SALPINGECTOMY, UTERUS MORE THAN 250 GRAMS;  Surgeon: Lafonda Mosses, MD;  Location: [facility];  Service: Gynecology;  Laterality: Bilateral;    Family History  Problem Relation Age of Onset   Diabetes Mother    Hypertension Mother    Diabetes Father    Hypertension Father    Endometrial cancer Maternal Aunt    Colon cancer Neg Hx    Breast cancer Neg Hx    Ovarian cancer Neg Hx    Pancreatic cancer Neg Hx    Prostate cancer Neg Hx     Social History   Socioeconomic History   Marital status: Single    Spouse name: Not on file   Number of children: Not on file  Years of education: Not on file   Highest education level: Not on file  Occupational History   Not on file  Tobacco Use   Smoking status: Never   Smokeless tobacco: Never  Vaping Use   Vaping Use: Never used  Substance and Sexual Activity   Alcohol use: Not Currently   Drug use: Never   Sexual activity: Not Currently  Other Topics Concern   Not on file  Social History Narrative   Not on file   Social Determinants of Health    Financial Resource Strain: Not on file  Food Insecurity: Food Insecurity Present   Worried About [place] in the Last Year: Often true   Ran Out of Food in the Last Year: Sometimes true  Transportation Needs: Unmet Transportation Needs   Lack of Transportation (Medical): Yes   Lack of Transportation (Non-Medical): Yes  Physical Activity: Not on file  Stress: Not on file  Social Connections: Not on file    Current Medications:  Current Outpatient Medications:    fluconazole (DIFLUCAN) 150 MG tablet, Take 1 tablet (150 mg total) by mouth daily., Disp: 1 tablet, Rfl: 0   nystatin (MYCOSTATIN/NYSTOP) powder, Apply 1 application. topically 3 (three) times daily., Disp: 15 g, Rfl: 1   acetaminophen (TYLENOL) 325 MG tablet, Take 650 mg by mouth every 6 (six) hours as needed for moderate pain., Disp: , Rfl:    amLODipine (NORVASC) 10 MG tablet, Take 1 tablet (10 mg total) by mouth daily., Disp: 90 tablet, Rfl: 0   amoxicillin-clavulanate (AUGMENTIN) 875-125 MG tablet, Take 1 tablet by mouth every 12 (twelve) hours for 7 days., Disp: 14 tablet, Rfl: 0   ferrous sulfate 325 (65 FE) MG tablet, Take 325 mg by mouth in the morning., Disp: , Rfl:    fluticasone (VERAMYST) 27.5 MCG/SPRAY nasal spray, Place 1 spray into the nose in the morning., Disp: , Rfl:    glipiZIDE (GLUCOTROL) 10 MG tablet, Take 1 tablet (10 mg total) by mouth 2 (two) times daily before a meal., Disp: 180 tablet, Rfl: 0   hydrochlorothiazide (HYDRODIURIL) 25 MG tablet, Take 1 tablet (25 mg total) by mouth daily., Disp: 90 tablet, Rfl: 0   ibuprofen (ADVIL) 200 MG tablet, Take 400 mg by mouth every 6 (six) hours as needed (pain)., Disp: , Rfl:    lisinopril (ZESTRIL) 40 MG tablet, Take 1 tablet (40 mg total) by mouth daily., Disp: 90 tablet, Rfl: 0   metFORMIN (GLUCOPHAGE) 1000 MG tablet, Take 1 tablet (1,000 mg total) by mouth 2 (two) times daily with a meal., Disp: 180 tablet, Rfl: 0   metoprolol succinate  (TOPROL-XL) 25 MG 24 hr tablet, Take 1 tablet (25 mg total) by mouth daily., Disp: 90 tablet, Rfl: 0   omeprazole (PRILOSEC OTC) 20 MG tablet, Take 20 mg by mouth every morning., Disp: , Rfl:    oxyCODONE (OXY IR/ROXICODONE) 5 MG immediate release tablet, Take 5 mg by mouth every 6 (six) hours as needed for severe pain., Disp: , Rfl:    senna-docusate (SENOKOT-S) 8.6-50 MG tablet, Take 2 tablets by mouth at bedtime. For AFTER surgery, do not take if having diarrhea (Patient not taking: Reported on 05/19/2021), Disp: 30 tablet, Rfl: 0   traMADol (ULTRAM) 50 MG tablet, Take 1 tablet (50 mg total) by mouth every 6 (six) hours as needed for severe pain. For AFTER surgery only, do not take and drive, Disp: 10 tablet, Rfl: 0  Review of Symptoms: Pertinent positives as per  HPI.  Physical Exam: LMP 04/27/2021 (Exact Date)  Deferred given limitations of phone visit.  Laboratory & Radiologic Studies: A. UTERUS, CERVIX, BILATERAL FALLOPIAN TUBES, HYSTERECTOMY AND  SALPINGECTOMY:  - Uterus with multiple leiomyomata - the largest leiomyoma measures 6.7  cm shows prolapse with  infarction and patchy acute inflammation.  See  comment  - Benign inactive endometrium  - Benign unremarkable cervix  - Benign unremarkable bilateral fallopian tubes  - No evidence of malignancy   COMMENT:  Infarcted leiomyoma showed only patchy acute inflammation but suppurative inflammation is not seen.  There are scattered clusters of gram-positive cocci on Gram stain.  Dr. Saralyn Pilar reviewed the case and concurs with the diagnosis.   Assessment & Plan: Crystal Vasquez is a 51 y.o. woman who is approximately 2 weeks status post robotic hysterectomy and bilateral salpingectomy in the setting of a large prolapsing fibroid treated with uterine artery embolization preoperatively.  Postoperative course was complicated by fever, increased lactic acid, and work-up negative for systemic infection.  Infection presumed to be related  to uterine artery embolization and necrotic/infected uterine fibroid.  Patient reports doing well since she has been discharged home.  Denies any systemic signs of infection.  She developed a rash on her right labia and groin that sounds to be Candida infection, expected in the setting of multiple antibiotics recently.  I have sent a one-time dose of Diflucan to her pharmacy as well as nystatin powder.  We reviewed signs and symptoms that should prompt a phone call before her in person follow-up.  I encouraged her to continue working on good glycemic control to decrease the risk of infection and to improved wound healing.  I discussed the assessment and treatment plan with the patient. The patient was provided with an opportunity to ask questions and all were answered. The patient agreed with the plan and demonstrated an understanding of the instructions.   The patient was advised to call back or see an in-person evaluation if the symptoms worsen or if the condition fails to improve as anticipated.   18 minutes of total time was spent for this patient encounter, including preparation, phone counseling with the patient and coordination of care, and documentation of the encounter.   Jeral Pinch, MD  Division of Gynecologic Oncology  Department of Obstetrics and Gynecology  [facility]

## 2021-06-01 ENCOUNTER — Ambulatory Visit: Payer: Managed Care, Other (non HMO) | Admitting: Obstetrics and Gynecology

## 2021-06-08 ENCOUNTER — Telehealth: Payer: Self-pay | Admitting: Hematology and Oncology

## 2021-06-08 NOTE — Telephone Encounter (Signed)
.  Called patient to schedule appointment per 2/21 inbasket, patient is aware of date and time.   °

## 2021-06-09 ENCOUNTER — Other Ambulatory Visit: Payer: Self-pay

## 2021-06-09 ENCOUNTER — Encounter: Payer: Self-pay | Admitting: Gynecologic Oncology

## 2021-06-09 ENCOUNTER — Other Ambulatory Visit: Payer: Self-pay | Admitting: *Deleted

## 2021-06-09 ENCOUNTER — Inpatient Hospital Stay: Payer: Self-pay

## 2021-06-09 ENCOUNTER — Inpatient Hospital Stay (HOSPITAL_BASED_OUTPATIENT_CLINIC_OR_DEPARTMENT_OTHER): Payer: Self-pay | Admitting: Gynecologic Oncology

## 2021-06-09 VITALS — BP 134/76 | HR 82 | Temp 98.2°F | Resp 16 | Ht 65.75 in | Wt 170.8 lb

## 2021-06-09 DIAGNOSIS — E119 Type 2 diabetes mellitus without complications: Secondary | ICD-10-CM

## 2021-06-09 DIAGNOSIS — D259 Leiomyoma of uterus, unspecified: Secondary | ICD-10-CM

## 2021-06-09 DIAGNOSIS — E1165 Type 2 diabetes mellitus with hyperglycemia: Secondary | ICD-10-CM

## 2021-06-09 DIAGNOSIS — R6889 Other general symptoms and signs: Secondary | ICD-10-CM

## 2021-06-09 DIAGNOSIS — R8290 Unspecified abnormal findings in urine: Secondary | ICD-10-CM

## 2021-06-09 DIAGNOSIS — Z90710 Acquired absence of both cervix and uterus: Secondary | ICD-10-CM

## 2021-06-09 DIAGNOSIS — B379 Candidiasis, unspecified: Secondary | ICD-10-CM

## 2021-06-09 DIAGNOSIS — Z90722 Acquired absence of ovaries, bilateral: Secondary | ICD-10-CM

## 2021-06-09 DIAGNOSIS — N858 Other specified noninflammatory disorders of uterus: Secondary | ICD-10-CM

## 2021-06-09 LAB — URINALYSIS, COMPLETE (UACMP) WITH MICROSCOPIC
Bacteria, UA: NONE SEEN
Bilirubin Urine: NEGATIVE
Glucose, UA: NEGATIVE mg/dL
Hgb urine dipstick: NEGATIVE
Ketones, ur: NEGATIVE mg/dL
Nitrite: NEGATIVE
Protein, ur: NEGATIVE mg/dL
Specific Gravity, Urine: 1.011 (ref 1.005–1.030)
pH: 7 (ref 5.0–8.0)

## 2021-06-09 NOTE — Progress Notes (Signed)
Gynecologic Oncology Return Clinic Visit ? ?06/09/21 ? ?Reason for Visit: follow-up after surgery ? ?Treatment History: ?05/18/2021: Robotic total hysterectomy with bilateral salpingectomy, contained morcellation.  This was done in the setting of a prolapsing endometrial versus endocervical fibroid after uterine artery embolization.  Final pathology revealed multiple leiomyoma. ?Patient was readmitted on postoperative day #1 in the setting of fever and malaise.  Infectious work-up was negative including blood culture, chest x-ray, urinalysis and culture and CT scan.  Patient was treated for presumed infection present at the time of surgery secondary to necrotic prolapsing fibroid.  She was treated with Zosyn and ultimately transition to Augmentin which she was discharged home on. ? ?Interval History: ?Overall doing well. ?She is having some difficulty with glycemic control.  She endorses being not so hungry but then worries that she will get hypoglycemic and eats but makes unhealthy food choices.  Her CBGs have been running over 200.  Today her blood sugar was 270. ?She denies any fevers or chills.  Denies any vaginal bleeding or discharge. ?Denies urinary symptoms other than foul smell to her urine since surgery. ?Has been struggling with constipation the last few days, using Senokot as needed.  Endorses a couple of days of upper abdominal pain and left lower quadrant pain intermittently. ? ?Past Medical/Surgical History: ?Past Medical History:  ?Diagnosis Date  ? Anemia   ? Asthma   ? due to acid reflux  ? Depression   ? DM2   ? Dyspnea   ? GERD (gastroesophageal reflux disease)   ? Headache   ? Heart murmur   ? benign  ? Hypertension   ? Obesity (BMI 30-39.9)   ? Thyroid disease   ? ? ?Past Surgical History:  ?Procedure Laterality Date  ? BIOPSY THYROID    ? IR ANGIOGRAM PELVIS SELECTIVE OR SUPRASELECTIVE  05/09/2021  ? IR ANGIOGRAM SELECTIVE EACH ADDITIONAL VESSEL  05/09/2021  ? IR AORTAGRAM ABDOMINAL SERIALOGRAM   05/09/2021  ? IR EMBO TUMOR ORGAN ISCHEMIA INFARCT INC GUIDE ROADMAPPING  05/09/2021  ? IR RADIOLOGIST EVAL & MGMT  04/11/2021  ? IR US GUIDE VASC ACCESS LEFT  05/09/2021  ? ROBOTIC ASSISTED LAPAROSCOPIC HYSTERECTOMY AND SALPINGECTOMY Bilateral 05/18/2021  ? Procedure: XI ROBOTIC ASSISTED LAPAROSCOPIC HYSTERECTOMY AND SALPINGECTOMY, UTERUS MORE THAN 250 GRAMS;  Surgeon: Lafonda Mosses, MD;  Location: [facility];  Service: Gynecology;  Laterality: Bilateral;  ? ? ?Family History  ?Problem Relation Age of Onset  ? Diabetes Mother   ? Hypertension Mother   ? Diabetes Father   ? Hypertension Father   ? Endometrial cancer Maternal Aunt   ? Colon cancer Neg Hx   ? Breast cancer Neg Hx   ? Ovarian cancer Neg Hx   ? Pancreatic cancer Neg Hx   ? Prostate cancer Neg Hx   ? ? ?Social History  ? ?Socioeconomic History  ? Marital status: Single  ?  Spouse name: Not on file  ? Number of children: Not on file  ? Years of education: Not on file  ? Highest education level: Not on file  ?Occupational History  ? Not on file  ?Tobacco Use  ? Smoking status: Never  ? Smokeless tobacco: Never  ?Vaping Use  ? Vaping Use: Never used  ?Substance and Sexual Activity  ? Alcohol use: Not Currently  ? Drug use: Never  ? Sexual activity: Not Currently  ?Other Topics Concern  ? Not on file  ?Social History Narrative  ? Not on file  ? ?Social  Determinants of Health  ? ?Financial Resource Strain: Not on file  ?Food Insecurity: Food Insecurity Present  ? Worried About Charity fundraiser in the Last Year: Often true  ? Ran Out of Food in the Last Year: Sometimes true  ?Transportation Needs: Unmet Transportation Needs  ? Lack of Transportation (Medical): Yes  ? Lack of Transportation (Non-Medical): Yes  ?Physical Activity: Not on file  ?Stress: Not on file  ?Social Connections: Not on file  ? ? ?Current Medications: ? ?Current Outpatient Medications:  ?  acetaminophen (TYLENOL) 325 MG tablet, Take 650 mg by mouth every 6 (six) hours as needed for moderate  pain., Disp: , Rfl:  ?  amLODipine (NORVASC) 10 MG tablet, Take 1 tablet (10 mg total) by mouth daily., Disp: 90 tablet, Rfl: 0 ?  ferrous sulfate 325 (65 FE) MG tablet, Take 325 mg by mouth in the morning., Disp: , Rfl:  ?  fluconazole (DIFLUCAN) 150 MG tablet, Take 1 tablet (150 mg total) by mouth daily., Disp: 1 tablet, Rfl: 0 ?  fluticasone (VERAMYST) 27.5 MCG/SPRAY nasal spray, Place 1 spray into the nose in the morning., Disp: , Rfl:  ?  glipiZIDE (GLUCOTROL) 10 MG tablet, Take 1 tablet (10 mg total) by mouth 2 (two) times daily before a meal., Disp: 180 tablet, Rfl: 0 ?  hydrochlorothiazide (HYDRODIURIL) 25 MG tablet, Take 1 tablet (25 mg total) by mouth daily., Disp: 90 tablet, Rfl: 0 ?  ibuprofen (ADVIL) 200 MG tablet, Take 400 mg by mouth every 6 (six) hours as needed (pain)., Disp: , Rfl:  ?  lisinopril (ZESTRIL) 40 MG tablet, Take 1 tablet (40 mg total) by mouth daily., Disp: 90 tablet, Rfl: 0 ?  metFORMIN (GLUCOPHAGE) 1000 MG tablet, Take 1 tablet (1,000 mg total) by mouth 2 (two) times daily with a meal., Disp: 180 tablet, Rfl: 0 ?  metoprolol succinate (TOPROL-XL) 25 MG 24 hr tablet, Take 1 tablet (25 mg total) by mouth daily., Disp: 90 tablet, Rfl: 0 ?  nystatin (MYCOSTATIN/NYSTOP) powder, Apply 1 application. topically 3 (three) times daily., Disp: 15 g, Rfl: 1 ?  omeprazole (PRILOSEC OTC) 20 MG tablet, Take 20 mg by mouth every morning., Disp: , Rfl:  ?  oxyCODONE (OXY IR/ROXICODONE) 5 MG immediate release tablet, Take 5 mg by mouth every 6 (six) hours as needed for severe pain., Disp: , Rfl:  ?  senna-docusate (SENOKOT-S) 8.6-50 MG tablet, Take 2 tablets by mouth at bedtime. For AFTER surgery, do not take if having diarrhea (Patient not taking: Reported on 05/19/2021), Disp: 30 tablet, Rfl: 0 ?  traMADol (ULTRAM) 50 MG tablet, Take 1 tablet (50 mg total) by mouth every 6 (six) hours as needed for severe pain. For AFTER surgery only, do not take and drive, Disp: 10 tablet, Rfl: 0 ? ?Review of  Systems: ?Pertinent positives include appetite changes, rash, others as per HPI. ?Denies fevers, chills, fatigue, unexplained weight changes. ?Denies hearing loss, neck lumps or masses, mouth sores, ringing in ears or voice changes. ?Denies cough or wheezing.  Denies shortness of breath. ?Denies chest pain or palpitations. Denies leg swelling. ?Denies abdominal distention, pain, blood in stools, diarrhea, nausea, vomiting, or early satiety. ?Denies pain with intercourse, dysuria, frequency, hematuria or incontinence. ?Denies hot flashes, vaginal bleeding or vaginal discharge.   ?Denies joint pain, back pain or muscle pain/cramps. ?Denies itching or wounds. ?Denies dizziness, headaches, numbness or seizures. ?Denies swollen lymph nodes or glands, denies easy bruising or bleeding. ?Denies anxiety, depression, confusion, or  decreased concentration. ? ?Physical Exam: ?BP 134/76 (BP Location: Left Arm, Patient Position: Sitting)   Pulse 82   Temp 98.2 ?F (36.8 ?C) (Oral)   Resp 16   Ht 5' 5.75" (1.67 m)   Wt 170 lb 12.8 oz (77.5 kg)   LMP 04/27/2021 (Exact Date)   SpO2 99%   BMI 27.78 kg/m?  ?General: Alert, oriented, no acute distress. ?HEENT: Normocephalic, atraumatic, sclera anicteric. ?Chest: Unlabored breathing on room air. ?Abdomen: soft, mildly distended, mild tenderness with palpation in the left lower quadrant.  Normoactive bowel sounds.  No masses or hepatosplenomegaly appreciated.  Well-healed incisions.  Remaining Dermabond removed. ?Extremities: Grossly normal range of motion.  Warm, well perfused.  No edema bilaterally. ?Skin: No rashes or lesions noted. ?GU: Normal appearing external genitalia without excoriation, or lesions.  Mild erythema bilateral labia.  Speculum exam reveals cuff intact, suture visible, minimal discharge, no blood.  Bimanual exam reveals cuff intact, no fluctuance or tenderness with palpation.   ? ?Laboratory & Radiologic Studies: ?None new ? ?Assessment & Plan: ?Crystal Vasquez is a 51 y.o. woman who is s/p robotic hysterectomy and bilateral salpingectomy on 3/1 in the setting of a large prolapsing fibroid treated with uterine artery embolization preoperatively.  Postoperative cours

## 2021-06-09 NOTE — Patient Instructions (Addendum)
You are healing well from surgery. Remember, no heavy lifting for 6 weeks and nothing in the vagina for at least 8 weeks. ? ?We will have a phone visit in a month to check in and make sure you are continuing to heal well after surgery.  Please start something regularly for your bowel function.  I will let you know if your urine shows any signs of infection.  Remember controlling your blood sugars is still very important during this recovery.  So you do not get an infection. ?

## 2021-06-11 LAB — URINE CULTURE: Culture: NO GROWTH

## 2021-06-25 NOTE — Progress Notes (Signed)
TRANSITION OF CARE VISIT ? ? ?Primary Care Provider (PCP):   Crystal Fruits, NP ?                                                    [facility] ?[address] [place] [place],  [place]  [address] ?Phone: [phone] ?Fax: [phone] ? ? ? ? ?Date of Admission: 05/19/2021 ? ?Date of Discharge: 05/23/2021 ? ?Transitions of Care Call: 05/24/2021 ? ?Discharged from: [facility]  ? ?Discharge Diagnosis:  ?  ?Discharge Diagnoses:  ?Principal Problem: ?  Fever postop ?Active Problems: ?  Iron deficiency anemia due to chronic blood loss ?  Hypertension ?  Uncontrolled type 2 diabetes mellitus with hyperglycemia ([place]) ?  ?Summary of Admission per MD note: ?  ?Discharged Condition:  The patient is in good condition and stable for discharge.   ?  ?Hospital Course: Crystal Vasquez is a 51 y.o. female s/p total robotic hysterectomy with bilateral salpingectomy on 3/1 in the setting of a large prolapsing uterine versus endocervical fibroid status postembolization 9 days before surgery found to have necrotic deflated fibroid on the day of surgery admitted as of 05/19/2021 with fever that began approximately 16 hours after surgery. There was an infection already present at the time of surgery in the setting of her embolization and necrosis of her intrauterine or intracervical fibroid.  Awaiting Gram stain on her pathology specimen ordered 05/20/21.  ?  ?CT scan performed 3/3 given fevers after more than 24 hours of IV antibiotics with no focal findings, postoperative findings expected.  Chest x-ray negative for any pulmonary infection.  Respiratory panel negative.  Urine without signs of infection.  Blood cultures NGTD after 4 days.  Patient was started on Zosyn since day of presentation, on 3/2.  Last fever on 3/3 at 1600. She was transitioned to oral Augmentin on 05/22/21 and has tolerated this well and remained afebrile with white blood  cell count of 5.4 this am. She was discharged home on 05/23/2021 with no pain, afebrile, passing flatus and having bowel movements, tolerating diet with an additional seven days of Augmentin prescribed. ?  ?Consults: Hospitalist, Pharmacy for zosyn ?  ?Significant Diagnostic Studies: Labs, CT ?  ?Treatments: IV hydration, IV antibiotic (zosyn), insulin, electrolyte replacement ?  ?Follow-Ups: Follow up with Crystal Mosses, MD (Gynecologic Oncology) on 05/30/2021; at 4:05pm will be a PHONE visit with Dr. Berline Vasquez to check in . IN PERSON visit will be on 06/09/21 at 2:15pm at the [facility]. ? ?3/23/203 [facility] per MD note: ?Assessment & Plan: ?Crystal Vasquez is a 51 y.o. woman who is s/p robotic hysterectomy and bilateral salpingectomy on 3/1 in the setting of a large prolapsing fibroid treated with uterine artery embolization preoperatively.  Postoperative course was complicated by fever, increased lactic acid, and work-up negative for systemic infection.  Infection presumed to be related to uterine artery embolization and necrotic/infected uterine fibroid. ?  ?Patient is overall doing well and meeting postoperative milestones.  Patient was given a copy of her final pathology report which we reviewed in detail together.  She is very happy with her benign pathology results. ?  ?We discussed continued expectations as well as postoperative restrictions.  It sounds like the patient has been lifting more than 10  pounds.  I suspect that her upper abdominal left-sided pain in the last couple of days may be related to overdoing it or to constipation.  I have encouraged her to decrease her activity some and to increase her bowel regimen. ?  ?We discussed continued use of nystatin powder given her improving vulvar erythema. ?  ?I suspect that her urine odor is related to poor glycemic control.  We will send a urine today to assure no infection. ?  ?Stressed the importance of  good glycemic control in the postoperative period. ?  ?We will have a phone visit in about a month to assure that she continues to heal well from surgery.  Reviewed reasons that should prompt her to call prior to that visit. ?  ?TODAY's VISIT: ?HOSPITAL DISCHARGE FOLLOW-UP: ?Patient reports she is aware of appointments with Gynecology. No issues/concerns.  ? ?2. HYPERTENSION FOLLOW-UP: ?Doing well on current regimen. No side effects. No issues/concerns. Denies chest pain and shortness of breath. Does not add salt to food. Does not exercise outside of normal routine. Has been 7 days without medications due to needing refill. ? ?3. DIABETES TYPE 2 FOLLOW-UP: ?Doing well on current regimen, no issues/concerns. Has noticed decreased appetite or preferring only sushi. Home blood sugars 160's-300's.  ? ?4. HISTORY OF HYPERTHYROIDISM: ?Concerns for previous thyroid labs. Also, concerns about adrenal issues and was told by someone that she knows that she should get checked.  ? ?5. MOLE: ?Reports mole at right temporal area present for years. Recently growing larger and would like to have checked. Also, concern for itching of the body especially eyebrows and hairline. No known family history of skin cancer.  ? ?6. BACK PAIN: ?Lower back pain after falling several years ago. Reports feels like bulging. Taking over-the-counter Ibuprofen not helping much. ? ?7. TOENAIL FUNGUS: ?Right foot only. Using over-the-counter medication not helping.  ? ?8. SNORING: ?Reports thinks she has sleep apnea. Never officially diagnosed. Reports gasping for air while asleep and states "I forget to breathe." ? ?Patient/Caregiver self-reported problems/concerns: see above ? ?MEDICATIONS ? ?Medication Reconciliation conducted with patient/caregiver? (Yes/ No): Yes  ? ?New medications prescribed/discontinued upon discharge? (Yes/No): Yes  ? ?Barriers identified related to medications: No ? ?LABS ? ?Lab Reviewed (Yes/No/NA): Yes ? ?PHYSICAL EXAM:   ? ?Physical Exam ?HENT:  ?   Head: Normocephalic and atraumatic.  ?Eyes:  ?   Extraocular Movements: Extraocular movements intact.  ?   Conjunctiva/sclera: Conjunctivae normal.  ?   Pupils: Pupils are equal, round, and reactive to light.  ?Cardiovascular:  ?   Rate and Rhythm: Normal rate and regular rhythm.  ?   Pulses: Normal pulses.  ?   Heart sounds: Normal heart sounds.  ?Pulmonary:  ?   Effort: Pulmonary effort is normal.  ?   Breath sounds: Normal breath sounds.  ?Abdominal:  ?   General: Bowel sounds are normal.  ?   Palpations: Abdomen is soft.  ?Musculoskeletal:  ?   Cervical back: Normal range of motion and neck supple.  ?Feet:  ?   Right foot:  ?   Toenail Condition: Fungal disease present. ?Skin: ?   Comments: Hyperpigmented papular mole right temporal aspect of head. No evidence of drainage or erythema.   ?Neurological:  ?   General: No focal deficit present.  ?   Mental Status: She is alert and oriented to person, place, and time.  ?Psychiatric:     ?   Mood and Affect: Mood normal.     ?  Behavior: Behavior normal.  ? ? ?Results for orders placed or performed in visit on 06/29/21  ?POCT glycosylated hemoglobin (Hb A1C)  ?Result Value Ref Range  ? Hemoglobin A1C 8.5 (A) 4.0 - 5.6 %  ? HbA1c POC (<> result, manual entry)    ? HbA1c, POC (prediabetic range)    ? HbA1c, POC (controlled diabetic range)    ? ? ?ASSESSMENT & PLAN: ?[facility] discharge follow-up: ?- Reviewed hospital course, current medications, ensured proper follow-up in place, and addressed concerns.  ? ?2. Uterine leiomyoma, unspecified location: ?3. Abnormal urine odor: ?4. Candida infection: ?- Keep all scheduled appointments with Jeral Pinch, MD at [facility]nec[facility]. ? ?5. Uncontrolled hypertension: ?- Blood pressure not at goal during today's visit. Patient asymptomatic without chest pressure, chest pain, palpitations, shortness of breath, worst headache of life, and any additional red flag  symptoms. ?- Continue Lisinopril, Hydrochlorothiazide, Amlodipine, and Metoprolol Succinate as prescribed.  ?- Counseled on blood pressure goal of less than 130/80, low-sodium, DASH diet, medication compliance,

## 2021-06-29 ENCOUNTER — Ambulatory Visit: Payer: Managed Care, Other (non HMO) | Admitting: Family Medicine

## 2021-06-29 ENCOUNTER — Encounter: Payer: Self-pay | Admitting: Family

## 2021-06-29 ENCOUNTER — Ambulatory Visit (INDEPENDENT_AMBULATORY_CARE_PROVIDER_SITE_OTHER): Payer: Self-pay

## 2021-06-29 ENCOUNTER — Ambulatory Visit (INDEPENDENT_AMBULATORY_CARE_PROVIDER_SITE_OTHER): Payer: Managed Care, Other (non HMO) | Admitting: Family

## 2021-06-29 VITALS — BP 160/92 | HR 67 | Temp 98.3°F | Resp 18 | Ht 65.75 in | Wt 170.0 lb

## 2021-06-29 DIAGNOSIS — Z8639 Personal history of other endocrine, nutritional and metabolic disease: Secondary | ICD-10-CM

## 2021-06-29 DIAGNOSIS — E119 Type 2 diabetes mellitus without complications: Secondary | ICD-10-CM

## 2021-06-29 DIAGNOSIS — M545 Low back pain, unspecified: Secondary | ICD-10-CM

## 2021-06-29 DIAGNOSIS — R0683 Snoring: Secondary | ICD-10-CM

## 2021-06-29 DIAGNOSIS — Z09 Encounter for follow-up examination after completed treatment for conditions other than malignant neoplasm: Secondary | ICD-10-CM

## 2021-06-29 DIAGNOSIS — R8290 Unspecified abnormal findings in urine: Secondary | ICD-10-CM

## 2021-06-29 DIAGNOSIS — Z1329 Encounter for screening for other suspected endocrine disorder: Secondary | ICD-10-CM

## 2021-06-29 DIAGNOSIS — B379 Candidiasis, unspecified: Secondary | ICD-10-CM

## 2021-06-29 DIAGNOSIS — B351 Tinea unguium: Secondary | ICD-10-CM

## 2021-06-29 DIAGNOSIS — I10 Essential (primary) hypertension: Secondary | ICD-10-CM

## 2021-06-29 DIAGNOSIS — D259 Leiomyoma of uterus, unspecified: Secondary | ICD-10-CM

## 2021-06-29 DIAGNOSIS — D229 Melanocytic nevi, unspecified: Secondary | ICD-10-CM

## 2021-06-29 LAB — POCT GLYCOSYLATED HEMOGLOBIN (HGB A1C): Hemoglobin A1C: 8.5 % — AB (ref 4.0–5.6)

## 2021-06-29 MED ORDER — METOPROLOL SUCCINATE ER 25 MG PO TB24: 25.0000 mg | ORAL_TABLET | Freq: Every day | ORAL | 0 refills | Status: DC

## 2021-06-29 MED ORDER — GLIPIZIDE 10 MG PO TABS: 10.0000 mg | ORAL_TABLET | Freq: Two times a day (BID) | ORAL | 0 refills | Status: DC

## 2021-06-29 MED ORDER — LISINOPRIL 40 MG PO TABS: 40.0000 mg | ORAL_TABLET | Freq: Every day | ORAL | 0 refills | Status: DC

## 2021-06-29 MED ORDER — IBUPROFEN 600 MG PO TABS: 600.0000 mg | ORAL_TABLET | Freq: Three times a day (TID) | ORAL | 0 refills | Status: DC | PRN

## 2021-06-29 MED ORDER — AMLODIPINE BESYLATE 10 MG PO TABS: 10.0000 mg | ORAL_TABLET | Freq: Every day | ORAL | 0 refills | Status: DC

## 2021-06-29 MED ORDER — METFORMIN HCL 1000 MG PO TABS: 1000.0000 mg | ORAL_TABLET | Freq: Two times a day (BID) | ORAL | 0 refills | Status: DC

## 2021-06-29 MED ORDER — HYDROCHLOROTHIAZIDE 25 MG PO TABS: 25.0000 mg | ORAL_TABLET | Freq: Every day | ORAL | 0 refills | Status: DC

## 2021-06-29 NOTE — Progress Notes (Signed)
Diabetes discussed in office.

## 2021-06-29 NOTE — Progress Notes (Signed)
Pt presents for diabetes and hypertension  ?Wants to discuss abnormal thyroid labs  ?Has complaints of lower back pain it bulges when she is in pain  ?Needs referral to dermatology for mole on left side of face  ?

## 2021-06-30 ENCOUNTER — Encounter: Payer: Self-pay | Admitting: Family

## 2021-06-30 ENCOUNTER — Other Ambulatory Visit: Payer: Self-pay | Admitting: Family

## 2021-06-30 ENCOUNTER — Encounter: Payer: Self-pay | Admitting: Internal Medicine

## 2021-06-30 ENCOUNTER — Ambulatory Visit (INDEPENDENT_AMBULATORY_CARE_PROVIDER_SITE_OTHER): Payer: Self-pay | Admitting: Internal Medicine

## 2021-06-30 VITALS — BP 158/88 | HR 64 | Ht 66.0 in | Wt 178.8 lb

## 2021-06-30 DIAGNOSIS — M47816 Spondylosis without myelopathy or radiculopathy, lumbar region: Secondary | ICD-10-CM

## 2021-06-30 DIAGNOSIS — I10 Essential (primary) hypertension: Secondary | ICD-10-CM

## 2021-06-30 LAB — TSH: TSH: 0.134 u[IU]/mL — ABNORMAL LOW (ref 0.450–4.500)

## 2021-06-30 NOTE — Progress Notes (Signed)
Arthritis lower back. Referral to Orthopedics for further evaluation and management. Their office should call within 2 weeks with appointment details.

## 2021-06-30 NOTE — Progress Notes (Signed)
Thyroid remaining lower than normal. Continue with plan discussed in office.

## 2021-06-30 NOTE — Progress Notes (Signed)
?Cardiology Office Note:   ? ?Date:  06/30/2021  ? ?ID:  Crystal Vasquez, DOB 04/16/1970, MRN [id number] ? ?PCP:  Camillia Herter, NP ?  ?[facility] Providers ?Cardiologist:  None    ? ?Referring MD: Camillia Herter, NP  ? ?No chief complaint on file. ?Resistant hypertensoin ? ?History of Present Illness:   ? ?Crystal Vasquez is a 51 y.o. female with a hx of HTN, DM2, grave's disease ? ?She has not checked her blood pressures consistently at home. She notes increased stress. She notes in the clinic she can feel uncomfortable.  ? ?She had palpations in the past. She had a 3 day holter - that was unremarkable. She notes hx of Grave's disease and her TSH is low. She notes that she is planned to see an endocrinologist.  ? ?She notes possible apnea at night and mentioned to her PCP. ? ?Her father has a family hx of htn, heart disease and stroke.  Mother with no heart disease.  ? ?No smoking. No significant etoh. ? ?TSH low 0.134. She is pending an endocrinology visit. ? ?Past Medical History:  ?Diagnosis Date  ? Anemia   ? Asthma   ? due to acid reflux  ? Depression   ? DM2   ? Dyspnea   ? GERD (gastroesophageal reflux disease)   ? Headache   ? Heart murmur   ? benign  ? Hypertension   ? Obesity (BMI 30-39.9)   ? Thyroid disease   ? ? ?Past Surgical History:  ?Procedure Laterality Date  ? BIOPSY THYROID    ? IR ANGIOGRAM PELVIS SELECTIVE OR SUPRASELECTIVE  05/09/2021  ? IR ANGIOGRAM SELECTIVE EACH ADDITIONAL VESSEL  05/09/2021  ? IR AORTAGRAM ABDOMINAL SERIALOGRAM  05/09/2021  ? IR EMBO TUMOR ORGAN ISCHEMIA INFARCT INC GUIDE ROADMAPPING  05/09/2021  ? IR RADIOLOGIST EVAL & MGMT  04/11/2021  ? IR US GUIDE VASC ACCESS LEFT  05/09/2021  ? ROBOTIC ASSISTED LAPAROSCOPIC HYSTERECTOMY AND SALPINGECTOMY Bilateral 05/18/2021  ? Procedure: XI ROBOTIC ASSISTED LAPAROSCOPIC HYSTERECTOMY AND SALPINGECTOMY, UTERUS MORE THAN 250 GRAMS;  Surgeon: Lafonda Mosses, MD;  Location: [facility];  Service: Gynecology;  Laterality:  Bilateral;  ? ? ?Current Medications: ?Current Meds  ?Medication Sig  ? acetaminophen (TYLENOL) 325 MG tablet Take 650 mg by mouth every 6 (six) hours as needed for moderate pain.  ? amLODipine (NORVASC) 10 MG tablet Take 1 tablet (10 mg total) by mouth daily.  ? ferrous sulfate 325 (65 FE) MG tablet Take 325 mg by mouth in the morning.  ? fluticasone (VERAMYST) 27.5 MCG/SPRAY nasal spray Place 1 spray into the nose in the morning.  ? glipiZIDE (GLUCOTROL) 10 MG tablet Take 1 tablet (10 mg total) by mouth 2 (two) times daily before a meal.  ? hydrochlorothiazide (HYDRODIURIL) 25 MG tablet Take 1 tablet (25 mg total) by mouth daily.  ? ibuprofen (ADVIL) 600 MG tablet Take 1 tablet (600 mg total) by mouth every 8 (eight) hours as needed.  ? lisinopril (ZESTRIL) 40 MG tablet Take 1 tablet (40 mg total) by mouth daily.  ? metFORMIN (GLUCOPHAGE) 1000 MG tablet Take 1 tablet (1,000 mg total) by mouth 2 (two) times daily with a meal.  ? metoprolol succinate (TOPROL-XL) 25 MG 24 hr tablet Take 1 tablet (25 mg total) by mouth daily.  ? omeprazole (PRILOSEC OTC) 20 MG tablet Take 20 mg by mouth every morning.  ? oxyCODONE (OXY IR/ROXICODONE) 5 MG immediate release tablet Take 5 mg  by mouth every 6 (six) hours as needed for severe pain.  ? traMADol (ULTRAM) 50 MG tablet Take 1 tablet (50 mg total) by mouth every 6 (six) hours as needed for severe pain. For AFTER surgery only, do not take and drive  ?  ? ?Allergies:   Patient has no known allergies.  ? ?Social History  ? ?Socioeconomic History  ? Marital status: Single  ?  Spouse name: Not on file  ? Number of children: Not on file  ? Years of education: Not on file  ? Highest education level: Not on file  ?Occupational History  ? Not on file  ?Tobacco Use  ? Smoking status: Never  ?  Passive exposure: Never  ? Smokeless tobacco: Never  ?Vaping Use  ? Vaping Use: Never used  ?Substance and Sexual Activity  ? Alcohol use: Not Currently  ? Drug use: Never  ? Sexual activity: Not  Currently  ?Other Topics Concern  ? Not on file  ?Social History Narrative  ? Not on file  ? ?Social Determinants of Health  ? ?Financial Resource Strain: Not on file  ?Food Insecurity: Food Insecurity Present  ? Worried About Charity fundraiser in the Last Year: Often true  ? Ran Out of Food in the Last Year: Sometimes true  ?Transportation Needs: Unmet Transportation Needs  ? Lack of Transportation (Medical): Yes  ? Lack of Transportation (Non-Medical): Yes  ?Physical Activity: Not on file  ?Stress: Not on file  ?Social Connections: Not on file  ?  ? ?Family History: ?The patient's family history includes Diabetes in her father and mother; Endometrial cancer in her maternal aunt; Hypertension in her father and mother. There is no history of Colon cancer, Breast cancer, Ovarian cancer, Pancreatic cancer, or Prostate cancer. ? ?ROS:   ?Please see the history of present illness.    ? All other systems reviewed and are negative. ? ?EKGs/Labs/Other Studies Reviewed:   ? ?The following studies were reviewed today: ? ? ?EKG:  EKG is  ordered today.  The ekg ordered today demonstrates  ? ?NSR, sinus arrhythmia ? ?Recent Labs: ?05/19/2021: ALT 15 ?05/22/2021: Magnesium 1.8 ?05/23/2021: BUN 10; Creatinine, Ser 0.54; Hemoglobin 8.0; Platelets 397; Potassium 4.4; Sodium 137 ?06/29/2021: TSH 0.134  ?Recent Lipid Panel ?No results found for: CHOL, TRIG, HDL, CHOLHDL, VLDL, LDLCALC, LDLDIRECT ? ? ?Risk Assessment/Calculations:   ?  ? ?    ? ?Physical Exam:   ? ?VS:   ? ?Vitals:  ? 06/30/21 0908  ?BP: (!) 158/88  ?Pulse: 64  ?SpO2: 99%  ? ? ? ?BP (!) 158/88   Pulse 64   Ht '5\' 6"'$  (1.676 m)   Wt 178 lb 12.8 oz (81.1 kg)   LMP 04/27/2021 (Exact Date)   SpO2 99%   BMI 28.86 kg/m?    ? ?Wt Readings from Last 3 Encounters:  ?06/30/21 178 lb 12.8 oz (81.1 kg)  ?06/29/21 170 lb (77.1 kg)  ?06/09/21 170 lb 12.8 oz (77.5 kg)  ?  ? ?GEN:  Well nourished, well developed in no acute distress ?HEENT: Normal ?NECK: No JVD; No carotid  bruits ?LYMPHATICS: No lymphadenopathy ?CARDIAC: RRR, no murmurs, rubs, gallops ?RESPIRATORY:  Clear to auscultation without rales, wheezing or rhonchi  ?ABDOMEN: Soft, non-tender, non-distended ?MUSCULOSKELETAL:  No edema; No deformity  ?SKIN: Warm and dry ?NEUROLOGIC:  Alert and oriented x 3 ?PSYCHIATRIC:  Normal affect  ? ?ASSESSMENT:   ? ?HTN: She has not conducted ambulatory blood pressure monitoring. Stress, possible  sleep apnea and hyperthyroid disease can all contribute.  If Bps are still high after everything has been addressed, then will plan for additional w/u. ?- continue norvasc 10 mg daily ?- continue lisinopril 40 mg daily ?- continue metoprolol XL 25 mg daily ?- continue Hctz 25 mg daily ? ? ?PLAN:   ? ?In order of problems listed above: ? ?Recommend sleep study per PCP ?Recommend management for thyroid dx; total T3, free T4; consider therapy if abnormal  ?Follow up in 3 months ? ?   ?   ?Medication Adjustments/Labs and Tests Ordered: ?Current medicines are reviewed at length with the patient today.  Concerns regarding medicines are outlined above.  ?Orders Placed This Encounter  ?Procedures  ? EKG 12-Lead  ? ?No orders of the defined types were placed in this encounter. ? ? ?Patient Instructions  ?Medication Instructions:  ?Your physician recommends that you continue on your current medications as directed. Please refer to the Current Medication list given to you today. ? ?*If you need a refill on your cardiac medications before your next appointment, please call your pharmacy* ? ? ?Follow-Up: ?At [facility], you and your health needs are our priority.  As part of our continuing mission to provide you with exceptional heart care, we have created designated Provider Care Teams.  These Care Teams include your primary Cardiologist (physician) and Advanced Practice Providers (APPs -  Physician Assistants and Nurse Practitioners) who all work together to provide you with the care you need, when you  need it. ? ?We recommend signing up for the patient portal called "MyChart".  Sign up information is provided on this After Visit Summary.  MyChart is used to connect with patients for Virtual Visits (Telemedicine).  Patients

## 2021-06-30 NOTE — Patient Instructions (Signed)
Medication Instructions:  ?Your physician recommends that you continue on your current medications as directed. Please refer to the Current Medication list given to you today. ? ?*If you need a refill on your cardiac medications before your next appointment, please call your pharmacy* ? ? ?Follow-Up: ?At [facility], you and your health needs are our priority.  As part of our continuing mission to provide you with exceptional heart care, we have created designated Provider Care Teams.  These Care Teams include your primary Cardiologist (physician) and Advanced Practice Providers (APPs -  Physician Assistants and Nurse Practitioners) who all work together to provide you with the care you need, when you need it. ? ?We recommend signing up for the patient portal called "MyChart".  Sign up information is provided on this After Visit Summary.  MyChart is used to connect with patients for Virtual Visits (Telemedicine).  Patients are able to view lab/test results, encounter notes, upcoming appointments, etc.  Non-urgent messages can be sent to your provider as well.   ?To learn more about what you can do with MyChart, go to [email or web address].   ? ?Your next appointment:   ?3 month(s) ? ?The format for your next appointment:   ?In Person ? ?Provider:   ?Phineas Inches, MD ? ? ?Other Instructions ?Please check your blood pressure once a day for two weeks. Please let us know your readings. I will see you again in 3 months ?It's important that your low TSH is follow up on, this can contribute.  ? ?Important Information About Sugar ? ? ? ? ? ? ? ? ?

## 2021-07-06 ENCOUNTER — Other Ambulatory Visit: Payer: Self-pay

## 2021-07-06 ENCOUNTER — Encounter: Payer: Self-pay | Admitting: Hematology and Oncology

## 2021-07-06 ENCOUNTER — Ambulatory Visit (INDEPENDENT_AMBULATORY_CARE_PROVIDER_SITE_OTHER): Payer: No Payment, Other | Admitting: Clinical

## 2021-07-06 ENCOUNTER — Inpatient Hospital Stay: Payer: Self-pay | Attending: Gynecologic Oncology | Admitting: Gynecologic Oncology

## 2021-07-06 DIAGNOSIS — E1165 Type 2 diabetes mellitus with hyperglycemia: Secondary | ICD-10-CM

## 2021-07-06 DIAGNOSIS — F411 Generalized anxiety disorder: Secondary | ICD-10-CM

## 2021-07-06 DIAGNOSIS — Z90710 Acquired absence of both cervix and uterus: Secondary | ICD-10-CM

## 2021-07-06 DIAGNOSIS — B379 Candidiasis, unspecified: Secondary | ICD-10-CM

## 2021-07-06 DIAGNOSIS — D259 Leiomyoma of uterus, unspecified: Secondary | ICD-10-CM

## 2021-07-06 DIAGNOSIS — Z9079 Acquired absence of other genital organ(s): Secondary | ICD-10-CM

## 2021-07-06 NOTE — Progress Notes (Signed)
Gynecologic Oncology Telehealth Consult Note: Gyn-Onc ? ?I connected with Sheridan on 07/06/21 at  4:00 PM EDT by telephone and verified that I am speaking with the correct person using two identifiers. ? ?I discussed the limitations, risks, security and privacy concerns of performing an evaluation and management service by telemedicine and the availability of in-person appointments. I also discussed with the patient that there may be a patient responsible charge related to this service. The patient expressed understanding and agreed to proceed. ? ?Other persons participating in the visit and their role in the encounter: none. ? ?Patient's location: home ?Provider's location: [facility] ? ?Reason for Visit: follow-up after surgery ? ?Treatment History: ?05/18/2021: Robotic total hysterectomy with bilateral salpingectomy, contained morcellation.  This was done in the setting of a prolapsing endometrial versus endocervical fibroid after uterine artery embolization.  Final pathology revealed multiple leiomyoma. ?Patient was readmitted on postoperative day #1 in the setting of fever and malaise.  Infectious work-up was negative including blood culture, chest x-ray, urinalysis and culture and CT scan.  Patient was treated for presumed infection present at the time of surgery secondary to necrotic prolapsing fibroid.  She was treated with Zosyn and ultimately transition to Augmentin which she was discharged home on. ? ?Interval History: ?Little pain intermittently on left side under larger incision. Occasional, no very bothersome. ?Bowel function back to normal. ?Denies urinary symptoms. ?Denies vaginal discharge or bleeding. ?Still working on glycemic control. ?Denies F/C. ? ?Past Medical/Surgical History: ?Past Medical History:  ?Diagnosis Date  ? Anemia   ? Asthma   ? due to acid reflux  ? Depression   ? DM2   ? Dyspnea   ? GERD (gastroesophageal reflux disease)   ? Headache   ? Heart murmur   ? benign  ?  Hypertension   ? Obesity (BMI 30-39.9)   ? Thyroid disease   ? ? ?Past Surgical History:  ?Procedure Laterality Date  ? BIOPSY THYROID    ? IR ANGIOGRAM PELVIS SELECTIVE OR SUPRASELECTIVE  05/09/2021  ? IR ANGIOGRAM SELECTIVE EACH ADDITIONAL VESSEL  05/09/2021  ? IR AORTAGRAM ABDOMINAL SERIALOGRAM  05/09/2021  ? IR EMBO TUMOR ORGAN ISCHEMIA INFARCT INC GUIDE ROADMAPPING  05/09/2021  ? IR RADIOLOGIST EVAL & MGMT  04/11/2021  ? IR US GUIDE VASC ACCESS LEFT  05/09/2021  ? ROBOTIC ASSISTED LAPAROSCOPIC HYSTERECTOMY AND SALPINGECTOMY Bilateral 05/18/2021  ? Procedure: XI ROBOTIC ASSISTED LAPAROSCOPIC HYSTERECTOMY AND SALPINGECTOMY, UTERUS MORE THAN 250 GRAMS;  Surgeon: Lafonda Mosses, MD;  Location: [facility];  Service: Gynecology;  Laterality: Bilateral;  ? ? ?Family History  ?Problem Relation Age of Onset  ? Diabetes Mother   ? Hypertension Mother   ? Diabetes Father   ? Hypertension Father   ? Endometrial cancer Maternal Aunt   ? Colon cancer Neg Hx   ? Breast cancer Neg Hx   ? Ovarian cancer Neg Hx   ? Pancreatic cancer Neg Hx   ? Prostate cancer Neg Hx   ? ? ?Social History  ? ?Socioeconomic History  ? Marital status: Single  ?  Spouse name: Not on file  ? Number of children: Not on file  ? Years of education: Not on file  ? Highest education level: Not on file  ?Occupational History  ? Not on file  ?Tobacco Use  ? Smoking status: Never  ?  Passive exposure: Never  ? Smokeless tobacco: Never  ?Vaping Use  ? Vaping Use: Never used  ?Substance and Sexual Activity  ?  Alcohol use: Not Currently  ? Drug use: Never  ? Sexual activity: Not Currently  ?Other Topics Concern  ? Not on file  ?Social History Narrative  ? Not on file  ? ?Social Determinants of Health  ? ?Financial Resource Strain: Not on file  ?Food Insecurity: Food Insecurity Present  ? Worried About Charity fundraiser in the Last Year: Often true  ? Ran Out of Food in the Last Year: Sometimes true  ?Transportation Needs: Unmet Transportation Needs  ? Lack of  Transportation (Medical): Yes  ? Lack of Transportation (Non-Medical): Yes  ?Physical Activity: Not on file  ?Stress: Not on file  ?Social Connections: Not on file  ? ? ?Current Medications: ? ?Current Outpatient Medications:  ?  acetaminophen (TYLENOL) 325 MG tablet, Take 650 mg by mouth every 6 (six) hours as needed for moderate pain., Disp: , Rfl:  ?  amLODipine (NORVASC) 10 MG tablet, Take 1 tablet (10 mg total) by mouth daily., Disp: 90 tablet, Rfl: 0 ?  ferrous sulfate 325 (65 FE) MG tablet, Take 325 mg by mouth in the morning., Disp: , Rfl:  ?  fluticasone (VERAMYST) 27.5 MCG/SPRAY nasal spray, Place 1 spray into the nose in the morning., Disp: , Rfl:  ?  glipiZIDE (GLUCOTROL) 10 MG tablet, Take 1 tablet (10 mg total) by mouth 2 (two) times daily before a meal., Disp: 180 tablet, Rfl: 0 ?  hydrochlorothiazide (HYDRODIURIL) 25 MG tablet, Take 1 tablet (25 mg total) by mouth daily., Disp: 90 tablet, Rfl: 0 ?  ibuprofen (ADVIL) 600 MG tablet, Take 1 tablet (600 mg total) by mouth every 8 (eight) hours as needed., Disp: 30 tablet, Rfl: 0 ?  lisinopril (ZESTRIL) 40 MG tablet, Take 1 tablet (40 mg total) by mouth daily., Disp: 90 tablet, Rfl: 0 ?  metFORMIN (GLUCOPHAGE) 1000 MG tablet, Take 1 tablet (1,000 mg total) by mouth 2 (two) times daily with a meal., Disp: 180 tablet, Rfl: 0 ?  metoprolol succinate (TOPROL-XL) 25 MG 24 hr tablet, Take 1 tablet (25 mg total) by mouth daily., Disp: 90 tablet, Rfl: 0 ?  omeprazole (PRILOSEC OTC) 20 MG tablet, Take 20 mg by mouth every morning., Disp: , Rfl:  ?  oxyCODONE (OXY IR/ROXICODONE) 5 MG immediate release tablet, Take 5 mg by mouth every 6 (six) hours as needed for severe pain., Disp: , Rfl:  ?  traMADol (ULTRAM) 50 MG tablet, Take 1 tablet (50 mg total) by mouth every 6 (six) hours as needed for severe pain. For AFTER surgery only, do not take and drive, Disp: 10 tablet, Rfl: 0 ? ?Review of Symptoms: ?Pertinent positives as per HPI. ? ?Physical Exam: ?LMP 04/27/2021  (Exact Date)  ?Deferred given limitations of phone visit. ? ?Laboratory & Radiologic Studies: ?Hgb A1c on 4/12 was 8.5% ? ?Assessment & Plan: ?Crystal Vasquez is a 51 y.o. woman who is s/p robotic hysterectomy and bilateral salpingectomy on 3/1 in the setting of a large prolapsing fibroid treated with uterine artery embolization preoperatively.  Postoperative course was complicated by fever, increased lactic acid, and work-up negative for systemic infection.  Infection presumed to be related to uterine artery embolization and necrotic/infected uterine fibroid. ? ?Doing well, meeting milestones. She's started increasing activity without any issues.  ? ?Vulvar symptoms have improved although given elevated glucose, I would expect this will continue to be an issue intermittently.  ? ?Discussed routine health care with PCP and gyn. ? ?I discussed the assessment and treatment plan with the  patient. The patient was provided with an opportunity to ask questions and all were answered. The patient agreed with the plan and demonstrated an understanding of the instructions.  ? ?The patient was advised to call back or see an in-person evaluation if the symptoms worsen or if the condition fails to improve as anticipated.  ? ?12 minutes of total time was spent for this patient encounter, including preparation, phone counseling with the patient and coordination of care, and documentation of the encounter. ? ? ?Jeral Pinch, MD  ?Division of Gynecologic Oncology  ?Department of Obstetrics and Gynecology  ?[facility]  ? ?

## 2021-07-08 ENCOUNTER — Encounter: Payer: Self-pay | Admitting: Hematology and Oncology
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
## 2021-07-20 ENCOUNTER — Ambulatory Visit: Payer: Self-pay | Admitting: Orthopaedic Surgery

## 2021-07-28 ENCOUNTER — Ambulatory Visit (HOSPITAL_COMMUNITY): Payer: Self-pay | Admitting: Licensed Clinical Social Worker

## 2021-08-12 ENCOUNTER — Telehealth: Payer: Self-pay | Admitting: Hematology and Oncology

## 2021-08-12 NOTE — Telephone Encounter (Signed)
Per provider request called pt to let her know about reschedule.  Pt friend confirmed appointment

## 2021-08-23 ENCOUNTER — Encounter: Payer: Self-pay | Admitting: Family

## 2021-08-23 ENCOUNTER — Telehealth: Payer: Self-pay | Admitting: Family

## 2021-08-23 NOTE — Telephone Encounter (Signed)
Copied from [place] [id number]. Topic: Referral - Status >> Aug 23, 2021 11:03 AM Alanda Slim E wrote: Reason for CRM: [facility] medical needs most recent labs for pts appt / please fax to [phone]/Attention [facility]

## 2021-08-24 ENCOUNTER — Other Ambulatory Visit: Payer: Self-pay | Admitting: Family

## 2021-08-24 DIAGNOSIS — E119 Type 2 diabetes mellitus without complications: Secondary | ICD-10-CM

## 2021-08-24 DIAGNOSIS — Z8639 Personal history of other endocrine, nutritional and metabolic disease: Secondary | ICD-10-CM

## 2021-08-24 NOTE — Telephone Encounter (Signed)
I'm not sure if [facility] accepts the orange card but the pt sent message that she would like referral to Endocrinologist that does. So you may can disregard resending labs if they don't accept her CAFA

## 2021-08-24 NOTE — Telephone Encounter (Signed)
Call patient with update from referral coordinator, Maren Reamer.

## 2021-08-24 NOTE — Telephone Encounter (Signed)
Endocrinology referral ordered

## 2021-08-26 ENCOUNTER — Telehealth: Payer: Self-pay | Admitting: Family

## 2021-08-29 NOTE — Telephone Encounter (Signed)
Thank you Alinda Sierras. Eboney please make patient aware. Any further concerns from patient notify me.

## 2021-09-22 ENCOUNTER — Ambulatory Visit (INDEPENDENT_AMBULATORY_CARE_PROVIDER_SITE_OTHER): Payer: No Payment, Other | Admitting: Clinical

## 2021-09-22 DIAGNOSIS — F411 Generalized anxiety disorder: Secondary | ICD-10-CM | POA: Diagnosis not present
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
## 2021-09-23 NOTE — Plan of Care (Signed)
  Problem: Anxiety Disorder CCP Problem  1  Goal: LTG: Patient will score less than 5 on the Generalized Anxiety Disorder 7 Scale (GAD-7) Outcome: Progressing Goal: STG: Patient will practice problem solving skills 3 times per week for the next 4 weeks Outcome: Progressing   

## 2021-09-28 NOTE — Progress Notes (Unsigned)
Cardiology Office Note:    Date:  09/28/2021   ID:  Crystal Vasquez, DOB 22-Feb-1971, MRN [id number]  PCP:  Camillia Herter, NP   [facility] Providers Cardiologist:  None     Referring MD: Camillia Herter, NP   No chief complaint on file. Resistant hypertensoin  History of Present Illness:    Crystal Vasquez is a 51 y.o. female with a hx of HTN, DM2, grave's disease  She has not checked her blood pressures consistently at home. She notes increased stress. She notes in the clinic she can feel uncomfortable.   She had palpations in the past. She had a 3 day holter - that was unremarkable. She notes hx of Grave's disease and her TSH is low. She notes that she is planned to see an endocrinologist.   She notes possible apnea at night and mentioned to her PCP.  Her father has a family hx of htn, heart disease and stroke.  Mother with no heart disease.   No smoking. No significant etoh.  TSH low 0.134. She is pending an endocrinology visit.  Interim Hx: Blood pressures at home are still elevated. She has not seen endocrinology yet with low TSH and concern for hyperthyroid. She has logged her Bps and all1 50-170s  Past Medical History:  Diagnosis Date   Anemia    Asthma    due to acid reflux   Depression    DM2    Dyspnea    GERD (gastroesophageal reflux disease)    Headache    Heart murmur    benign   Hypertension    Obesity (BMI 30-39.9)    Thyroid disease     Past Surgical History:  Procedure Laterality Date   BIOPSY THYROID     IR ANGIOGRAM PELVIS SELECTIVE OR SUPRASELECTIVE  05/09/2021   IR ANGIOGRAM SELECTIVE EACH ADDITIONAL VESSEL  05/09/2021   IR AORTAGRAM ABDOMINAL SERIALOGRAM  05/09/2021   IR EMBO TUMOR ORGAN ISCHEMIA INFARCT INC GUIDE ROADMAPPING  05/09/2021   IR RADIOLOGIST EVAL & MGMT  04/11/2021   IR US GUIDE VASC ACCESS LEFT  05/09/2021   ROBOTIC ASSISTED LAPAROSCOPIC HYSTERECTOMY AND SALPINGECTOMY Bilateral 05/18/2021   Procedure: XI ROBOTIC  ASSISTED LAPAROSCOPIC HYSTERECTOMY AND SALPINGECTOMY, UTERUS MORE THAN 250 GRAMS;  Surgeon: Lafonda Mosses, MD;  Location: [facility];  Service: Gynecology;  Laterality: Bilateral;    Current Medications: No outpatient medications have been marked as taking for the 09/29/21 encounter (Appointment) with Janina Mayo, MD.     Allergies:   Patient has no known allergies.   Social History   Socioeconomic History   Marital status: Single    Spouse name: Not on file   Number of children: Not on file   Years of education: Not on file   Highest education level: Not on file  Occupational History   Not on file  Tobacco Use   Smoking status: Never    Passive exposure: Never   Smokeless tobacco: Never  Vaping Use   Vaping Use: Never used  Substance and Sexual Activity   Alcohol use: Not Currently   Drug use: Never   Sexual activity: Not Currently  Other Topics Concern   Not on file  Social History Narrative   Not on file   Social Determinants of Health   Financial Resource Strain: Not on file  Food Insecurity: Food Insecurity Present (11/11/2020)   Hunger Vital Sign    Worried About Running Out of Food in the Last  Year: Often true    Ran Out of Food in the Last Year: Sometimes true  Transportation Needs: Unmet Transportation Needs (11/11/2020)   PRAPARE - Hydrologist (Medical): Yes    Lack of Transportation (Non-Medical): Yes  Physical Activity: Not on file  Stress: Not on file  Social Connections: Not on file     Family History: The patient's family history includes Diabetes in her father and mother; Endometrial cancer in her maternal aunt; Hypertension in her father and mother. There is no history of Colon cancer, Breast cancer, Ovarian cancer, Pancreatic cancer, or Prostate cancer.  ROS:   Please see the history of present illness.     All other systems reviewed and are negative.  EKGs/Labs/Other Studies Reviewed:    The following studies  were reviewed today:   EKG:  EKG is  ordered today.  The ekg ordered today demonstrates   NSR, sinus arrhythmia  Recent Labs: 05/19/2021: ALT 15 05/22/2021: Magnesium 1.8 05/23/2021: BUN 10; Creatinine, Ser 0.54; Hemoglobin 8.0; Platelets 397; Potassium 4.4; Sodium 137 06/29/2021: TSH 0.134  Recent Lipid Panel No results found for: "CHOL", "TRIG", "HDL", "CHOLHDL", "VLDL", "LDLCALC", "LDLDIRECT"   Risk Assessment/Calculations:           Physical Exam:    VS:    There were no vitals filed for this visit.    LMP 04/27/2021 (Exact Date)     Wt Readings from Last 3 Encounters:  06/30/21 178 lb 12.8 oz (81.1 kg)  06/29/21 170 lb (77.1 kg)  06/09/21 170 lb 12.8 oz (77.5 kg)     GEN:  Well nourished, well developed in no acute distress HEENT: Normal NECK: No JVD; No carotid bruits LYMPHATICS: No lymphadenopathy CARDIAC: RRR, no murmurs, rubs, gallops RESPIRATORY:  Clear to auscultation without rales, wheezing or rhonchi  ABDOMEN: Soft, non-tender, non-distended MUSCULOSKELETAL:  No edema; No deformity  SKIN: Warm and dry NEUROLOGIC:  Alert and oriented x 3 PSYCHIATRIC:  Normal affect   ASSESSMENT:    Resistant HTN: Ambulatory blood pressure monitoring demonstrates poorly controlled BP on her medications.Resistant on 3 medications. Stress, possible sleep apnea and hyperthyroid disease can all contribute. Will complete a full work; including additional thyroid studies and manage with therapy if abnormal. No adrenal gland mass on CT abdomen 05/20/2021; low concern for pheochromocytoma.  - continue norvasc 10 mg daily - stop lisinopril 40 mg daily  - start olmesartan 40 mg daily - stop  metoprolol XL 25 mg daily - start bisoprolol 10 mg daily - continue Hctz 25 mg daily PLAN:    In order of problems listed above:  Renal ultrasound Free T4, T3, total T3, total T4 Renin/aldosterine ratio TTE Sleep study Stop lisinopril , change to olmesartan 40 mg daily Stop metoprolol,  start bisoprolol 10 mg daily Pharmacy visit in 3 months Follow up 6 months        Medication Adjustments/Labs and Tests Ordered: Current medicines are reviewed at length with the patient today.  Concerns regarding medicines are outlined above.  No orders of the defined types were placed in this encounter.  No orders of the defined types were placed in this encounter.   There are no Patient Instructions on file for this visit.   Signed, Janina Mayo, MD  09/28/2021 7:58 AM     [facility]

## 2021-09-29 ENCOUNTER — Ambulatory Visit (INDEPENDENT_AMBULATORY_CARE_PROVIDER_SITE_OTHER): Payer: Self-pay | Admitting: Internal Medicine

## 2021-09-29 ENCOUNTER — Encounter: Payer: Self-pay | Admitting: Internal Medicine

## 2021-09-29 VITALS — BP 158/90 | HR 68 | Ht 66.0 in | Wt 176.2 lb

## 2021-09-29 DIAGNOSIS — R0683 Snoring: Secondary | ICD-10-CM

## 2021-09-29 DIAGNOSIS — Q251 Coarctation of aorta: Secondary | ICD-10-CM

## 2021-09-29 DIAGNOSIS — I10 Essential (primary) hypertension: Secondary | ICD-10-CM

## 2021-09-29 LAB — T4: T4, Total: 8 ug/dL (ref 4.5–12.0)

## 2021-09-29 LAB — T4, FREE: Free T4: 1.13 ng/dL (ref 0.82–1.77)

## 2021-09-29 LAB — T3: T3, Total: 103 ng/dL (ref 71–180)

## 2021-09-29 LAB — T3, FREE: T3, Free: 2.8 pg/mL (ref 2.0–4.4)

## 2021-09-29 MED ORDER — BISOPROLOL FUMARATE 10 MG PO TABS: 10.0000 mg | ORAL_TABLET | Freq: Every day | ORAL | 3 refills | Status: DC

## 2021-09-29 MED ORDER — OLMESARTAN MEDOXOMIL 40 MG PO TABS: 40.0000 mg | ORAL_TABLET | Freq: Every day | ORAL | 3 refills | Status: DC

## 2021-09-29 NOTE — Telephone Encounter (Signed)
Pt has appointment in December w/[facility]

## 2021-09-29 NOTE — Patient Instructions (Addendum)
Medication Instructions:   STOP: LISINOPRIL   STOP TOPROL (METOPROLOL SUCCINATE)  START: OLMESARTAN '40mg'$  ONCE DAILY   START: BISOPROLOL '10mg'$  ONCE DAILY   *If you need a refill on your cardiac medications before your next appointment, please call your pharmacy*  Lab Work: BLOOD WORK TODAY If you have labs (blood work) drawn today and your tests are completely normal, you will receive your results only by: [place] (if you have MyChart) OR A paper copy in the mail If you have any lab test that is abnormal or we need to change your treatment, we will call you to review the results.  Testing/Procedures: Your physician has requested that you have a renal artery duplex. During this test, an ultrasound is used to evaluate blood flow to the kidneys. Allow one hour for this exam. Do not eat after midnight the day before and avoid carbonated beverages. Take your medications as you usually do.  Your physician has requested that you have an echocardiogram. Echocardiography is a painless test that uses sound waves to create images of your heart. It provides your doctor with information about the size and shape of your heart and how well your heart's chambers and valves are working. You may receive an ultrasound enhancing agent through an IV if needed to better visualize your heart during the echo.This procedure takes approximately one hour. There are no restrictions for this procedure. This will take place at the [address].   Follow-Up: At [facility], you and your health needs are our priority.  As part of our continuing mission to provide you with exceptional heart care, we have created designated Provider Care Teams.  These Care Teams include your primary Cardiologist (physician) and Advanced Practice Providers (APPs -  Physician Assistants and Nurse Practitioners) who all work together to provide you with the care you need, when you need it.  PLEASE SCHEDULE APPOINTMENT  WITH PHARMD IN 3 MONTHS FOR BLOOD PRESSURE CHECK   Your next appointment:   6 month(s)  The format for your next appointment:   In Person  Provider:   Janina Mayo, MD    Other Instructions  REFERRAL TO PULMONARY FOR SLEEP STUDY- SOMEONE WILL REACH OUT TO GET YOU SCHEDULED FOR THIS

## 2021-10-09 LAB — ALDOSTERONE + RENIN ACTIVITY W/ RATIO
ALDOS/RENIN RATIO: 15 (ref 0.0–30.0)
ALDOSTERONE: 8.9 ng/dL (ref 0.0–30.0)
Renin: 0.592 ng/mL/hr (ref 0.167–5.380)

## 2021-10-10 ENCOUNTER — Encounter (HOSPITAL_COMMUNITY): Payer: Self-pay | Admitting: Student in an Organized Health Care Education/Training Program

## 2021-10-10 ENCOUNTER — Other Ambulatory Visit: Payer: Self-pay

## 2021-10-10 ENCOUNTER — Encounter: Payer: Self-pay | Admitting: Hematology and Oncology

## 2021-10-10 ENCOUNTER — Encounter (HOSPITAL_BASED_OUTPATIENT_CLINIC_OR_DEPARTMENT_OTHER): Payer: Self-pay

## 2021-10-10 ENCOUNTER — Ambulatory Visit (INDEPENDENT_AMBULATORY_CARE_PROVIDER_SITE_OTHER): Payer: No Payment, Other | Admitting: Student in an Organized Health Care Education/Training Program

## 2021-10-10 ENCOUNTER — Ambulatory Visit (INDEPENDENT_AMBULATORY_CARE_PROVIDER_SITE_OTHER): Payer: Self-pay

## 2021-10-10 VITALS — BP 142/69 | HR 66 | Ht 66.0 in | Wt 172.2 lb

## 2021-10-10 DIAGNOSIS — F4310 Post-traumatic stress disorder, unspecified: Secondary | ICD-10-CM | POA: Diagnosis not present

## 2021-10-10 DIAGNOSIS — F331 Major depressive disorder, recurrent, moderate: Secondary | ICD-10-CM | POA: Diagnosis not present

## 2021-10-10 DIAGNOSIS — F411 Generalized anxiety disorder: Secondary | ICD-10-CM

## 2021-10-10 DIAGNOSIS — I10 Essential (primary) hypertension: Secondary | ICD-10-CM

## 2021-10-10 LAB — ECHOCARDIOGRAM COMPLETE
AR max vel: 2.66 cm2
AV Area VTI: 2.69 cm2
AV Area mean vel: 2.48 cm2
AV Mean grad: 4 mmHg
AV Peak grad: 6.9 mmHg
Ao pk vel: 1.31 m/s
Area-P 1/2: 3.53 cm2
S' Lateral: 2.14 cm

## 2021-10-10 MED ORDER — SERTRALINE HCL 25 MG PO TABS
25.0000 mg | ORAL_TABLET | Freq: Every day | ORAL | 0 refills | Status: DC
  Filled 2021-10-10: qty 30, 30d supply, fill #0

## 2021-10-10 MED ORDER — QUETIAPINE FUMARATE 50 MG PO TABS
50.0000 mg | ORAL_TABLET | Freq: Every day | ORAL | 1 refills | Status: DC
  Filled 2021-10-10: qty 30, 30d supply, fill #0
  Filled 2021-11-09: qty 30, 30d supply, fill #1
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
## 2021-10-11 ENCOUNTER — Ambulatory Visit (HOSPITAL_COMMUNITY)
Admission: RE | Admit: 2021-10-11 | Discharge: 2021-10-11 | Disposition: A | Discharge status: Home / Self Care | Payer: Self-pay | Source: Ambulatory Visit | Attending: Cardiovascular Disease | Admitting: Cardiovascular Disease

## 2021-10-11 DIAGNOSIS — I10 Essential (primary) hypertension: Secondary | ICD-10-CM | POA: Insufficient documentation

## 2021-10-12 ENCOUNTER — Encounter: Payer: Self-pay | Admitting: Hematology and Oncology

## 2021-10-12 ENCOUNTER — Other Ambulatory Visit: Payer: Self-pay

## 2021-10-12 ENCOUNTER — Ambulatory Visit (INDEPENDENT_AMBULATORY_CARE_PROVIDER_SITE_OTHER): Payer: Self-pay | Admitting: Pharmacist

## 2021-10-12 ENCOUNTER — Encounter: Payer: Self-pay | Admitting: Pharmacist

## 2021-10-12 VITALS — BP 149/81 | HR 60 | Wt 177.4 lb

## 2021-10-12 DIAGNOSIS — I10 Essential (primary) hypertension: Secondary | ICD-10-CM

## 2021-10-12 MED ORDER — HYDRALAZINE HCL 25 MG PO TABS
25.0000 mg | ORAL_TABLET | Freq: Three times a day (TID) | ORAL | 2 refills | Status: DC
  Filled 2021-10-12: qty 90, 30d supply, fill #0
  Filled 2021-11-09: qty 90, 30d supply, fill #1

## 2021-10-12 NOTE — Patient Instructions (Addendum)
It was nice meeting you today  Please continue your current medications and start hydralazine '25mg'$  up to three times a day  Please contact with any questions   Karren Cobble, PharmD, [place], [place], [place], [place], [place], [address] Phone: [phone], Fax: [phone]

## 2021-10-12 NOTE — Progress Notes (Signed)
Patient ID: Crystal Vasquez                 DOB: [birth year]                      MRN: [id number]     HPI: Crystal Vasquez is a 51 y.o. female referred by Dr. Harl Bowie to HTN clinic. PMH is significant for HTN, T2DM, and Graves Disease.  Patient's situation is currently complicated by homelessness. Has been living in her car but her cr recently broke down. Has been staying with friend's houses but does not feel safe.  Was recently hired by [employer] and is going through training.  Seen by behavioral health yesterday and diagnosed with PTSD.  Started on sertraline and quetiapine..  Reports no adverse effects to new medications. Has been checking BP and reports it is consistently elevated. However she believes this is due to stress from her living situation and inability to use her car. Would rather live in her car than at her friend's house.  Have brought LCSW in for assistance.  Current HTN meds:  Amlodipine '10mg'$  daily Bisoprolol '10mg'$  daily HCTZ '25mg'$  daily Olmesartan '40mg'$  daily Furosemide '20mg'$  daily  BP goal: <130/80   Wt Readings from Last 3 Encounters:  10/10/21 172 lb 3.2 oz (78.1 kg)  09/29/21 176 lb 3.2 oz (79.9 kg)  06/30/21 178 lb 12.8 oz (81.1 kg)   BP Readings from Last 3 Encounters:  10/10/21 (!) 142/69  09/29/21 (!) 158/90  06/30/21 (!) 158/88   Pulse Readings from Last 3 Encounters:  10/10/21 66  09/29/21 68  06/30/21 64    Renal function: CrCl cannot be calculated (Patient's most recent lab result is older than the maximum 21 days allowed.).  Past Medical History:  Diagnosis Date   Anemia    Asthma    due to acid reflux   Depression    DM2    Dyspnea    GERD (gastroesophageal reflux disease)    Headache    Heart murmur    benign   Hypertension    Obesity (BMI 30-39.9)    Thyroid disease     Current Outpatient Medications on File Prior to Visit  Medication Sig Dispense Refill   ACETAMINOPHEN EXTRA STRENGTH 500 MG capsule SMARTSIG:2 Capsule(s)  By Mouth Daily PRN     amLODipine (NORVASC) 10 MG tablet Take 1 tablet (10 mg total) by mouth daily. 90 tablet 0   bisoprolol (ZEBETA) 10 MG tablet Take 1 tablet (10 mg total) by mouth daily. 90 tablet 3   ferrous sulfate 325 (65 FE) MG tablet Take 325 mg by mouth in the morning.     fluticasone (VERAMYST) 27.5 MCG/SPRAY nasal spray Place 1 spray into the nose in the morning.     glipiZIDE (GLUCOTROL) 10 MG tablet Take 1 tablet (10 mg total) by mouth 2 (two) times daily before a meal. 180 tablet 0   hydrochlorothiazide (HYDRODIURIL) 25 MG tablet Take 1 tablet (25 mg total) by mouth daily. 90 tablet 0   LASIX 20 MG tablet Take 20 mg by mouth daily as needed.     meloxicam (MOBIC) 15 MG tablet Take 15 mg by mouth daily as needed.     metFORMIN (GLUCOPHAGE) 1000 MG tablet Take 1 tablet (1,000 mg total) by mouth 2 (two) times daily with a meal. 180 tablet 0   olmesartan (BENICAR) 40 MG tablet Take 1 tablet (40 mg total) by mouth daily. 90 tablet 3  omeprazole (PRILOSEC) 20 MG capsule Take 20 mg by mouth daily.     QUEtiapine (SEROQUEL) 50 MG tablet Take 1 tablet (50 mg total) by mouth at bedtime. 30 tablet 1   sertraline (ZOLOFT) 25 MG tablet Take 1 tablet (25 mg total) by mouth daily. 30 tablet 0   simvastatin (ZOCOR) 20 MG tablet Take 20 mg by mouth at bedtime.     No current facility-administered medications on file prior to visit.    No Known Allergies   Assessment/Plan:  1. Hypertension -  Patient BP in room 149/81 which is above goal of <130/80 and consistent with previous readings.  Stress and worry are likely contributing factors since patient currently on 4 antihypertensives.  Hopeful once patient starts employment and has more stable housing that blood pressure will decrease and may not need as many medications.   Currently on max dose CCB, BB, ARB and thiazide. Will add hydralazine at this point. Advised patient to continue monitor BP at home and gave signs/symptoms of hypotension.  May not need three times daily.  Recheck in 4 weeks.  Continue: Amlodipine '10mg'$  daily Bisoprolol '10mg'$  daily HCTZ '25mg'$  daily Olmesartan '40mg'$  daily Furosemide '20mg'$  daily Start hydralazine '25mg'$  TID  Karren Cobble, PharmD, BCACP, [place], [place], [place], [place], [address] Phone: [phone], Fax: [phone]

## 2021-10-12 NOTE — Progress Notes (Signed)
Heart and Vascular Care Navigation  10/12/2021  Crystal Vasquez 12/08/1970 [id number]  Reason for Referral:  Patient is participating in a Managed Medicaid Plan: No, self pay only ([place] Card/CAFA has been approved)  Engaged with patient face to face for initial visit for Heart and Vascular Care Coordination.                                                                                                   Assessment:    LCSW met with pt at [facility] clinic today prior to her pharmacy visit. Introduced self and role. We discussed pt current housing- listed address is [facility] but she also can receive mail at [address], [place], [place]. Pt requests I update her contact to her friend Trudee Kuster [phone]). She currently has no permanent residence- moved here from [place] and has has had multiple medical challenges that made maintaining consistent work difficult. She is staying with friends (multiple reasons why this has not been a stable environment), in friends' vehicles, and in her vehicle (currently not fully operational).  She is training to begin work with [employer]. She has been extremely diligent with connecting with [employer] including, but not limited to, the [facility] and [employer]. Unfortunately, at this time due to no income and community availability it has been difficult for her to find stable housing through any organization. This Probation officer is also limited by these factors, however I am able to reach out to [employer] Coordinated Entry line to see if one of their housing assessors can reach out to her to discuss options/resources further. I did so while pt was in front of me. She has been receiving primary care at [facility]) and obtaining medications through them. I discussed eligibility for [employer]ist and she could have medications mailed to her [place] or friends home. If I have any additional resources that may be of  assistance I will provide those to pt.   Pt open to staying in touch with this Probation officer. No additional questions at this time. Pt understands she can call/text/e-mail during business hours.                                    HRT/VAS Care Coordination     Patients Home Cardiology Office [place] Team Social Worker   Social Worker Name: Valeda Malm, [facility] [phone]   Living arrangements for the past 2 months No permanent address   Lives with: Friends; Self   Patient Current Insurance Coverage [place] Card; [facility] Assistance  through 10/2021   Does Patient Have Prescription Coverage? No   Patient Prescription Assistance Programs [place] [facility]; Other   [place] [facility] Medications provided pt with application   Other Assistance Programs Medications currently getting medications through [employer] None       Social History:  SDOH Screenings   Alcohol Screen: Not on file  Depression (PHQ2-9): Low Risk  (06/29/2021)   Depression (PHQ2-9)    PHQ-2 Score: 4  Financial Resource Strain: High Risk (10/12/2021)   Overall Financial Resource Strain (CARDIA)    Difficulty of Paying Living Expenses: Very hard  Food Insecurity: Food Insecurity Present (10/12/2021)   Hunger Vital Sign    Worried About Running Out of Food in the Last Year: Often true    Ran Out of Food in the Last Year: Often true  Housing: High Risk (10/12/2021)   Housing    Last Housing Risk Score: 3  Physical Activity: Not on file  Social Connections: Not on file  Stress: Not on file  Tobacco Use: Low Risk  (10/10/2021)   Patient History    Smoking Tobacco Use: Never    Smokeless Tobacco Use: Never    Passive Exposure: Never  Transportation Needs: Unmet Transportation Needs (10/12/2021)   PRAPARE - Hydrologist (Medical): Yes    Lack of Transportation  (Non-Medical): Yes    SDOH Interventions: Financial Resources:  Financial Strain Interventions: Other (Comment) (Has [employer], provided [employer] application, Heritage manager, Referral to Chiropractor) Occupational hygienist for [place] Insecurity:  Food Insecurity Interventions: Other (Comment) (SNAP FNS [place])  Housing Insecurity:  Housing Interventions: Other (Comment) (referral to Coordinated Entry)  Transportation:   Transportation Interventions: [employer] Given     Other Care Navigation Interventions:     Provided Pharmacy assistance resources [employer], Other   Follow-up plan:   LCSW provided pt with my card, bus passes, and [employer]ed[employer]ist application. I have sent referral email to [facility] and provided pt with their information. I will f/u with pt to provide check in and connection to any additional resources that I am made aware of. I am also available to assist pt when she needs to renew CAFA and [employer].

## 2021-10-14 ENCOUNTER — Ambulatory Visit (INDEPENDENT_AMBULATORY_CARE_PROVIDER_SITE_OTHER): Payer: No Payment, Other | Admitting: Clinical

## 2021-10-14 DIAGNOSIS — F411 Generalized anxiety disorder: Secondary | ICD-10-CM | POA: Diagnosis not present

## 2021-10-14 NOTE — Plan of Care (Signed)
  Problem: Anxiety Disorder CCP Problem  1  Goal: STG: Patient will practice problem solving skills 3 times per week for the next 4 weeks Outcome: Progressing   Problem: Anxiety Disorder CCP Problem  1  Goal: LTG: Patient will score less than 5 on the Generalized Anxiety Disorder 7 Scale (GAD-7) Outcome: Not Progressing
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
## 2021-10-20 ENCOUNTER — Telehealth: Payer: Self-pay | Admitting: Licensed Clinical Social Worker

## 2021-10-20 NOTE — Telephone Encounter (Signed)
H&V Care Navigation CSW Progress Note  Clinical Social Worker contacted patient by phone to f/u after appt with pharmacy team last week. Pt answered at [phone], attempted to speak with pt but unfortunately impeded by the phone line. Pt couldn't hear me but I could hear her clearly. She was able to communicate that she had spoken with [facility]. However she then continued speaking saying I was going in and out and she was going to start singing bc she was frustrated. Attempted to ask if I could text her. Unable to ascertain answer- I have sent her a text message acknowledging that I could hear her state that she had connected with [facility] and apologizing for her frustration.    Patient is participating in a Managed Medicaid Plan:  No, self pay only (has been approved for [employer] and [employer])  SDOH Screenings   Alcohol Screen: Not on file  Depression (PHQ2-9): Low Risk  (06/29/2021)   Depression (PHQ2-9)    PHQ-2 Score: 4  Financial Resource Strain: High Risk (10/12/2021)   Overall Financial Resource Strain (CARDIA)    Difficulty of Paying Living Expenses: Very hard  Food Insecurity: Food Insecurity Present (10/12/2021)   Hunger Vital Sign    Worried About Running Out of Food in the Last Year: Often true    Ran Out of Food in the Last Year: Often true  Housing: High Risk (10/12/2021)   Housing    Last Housing Risk Score: 3  Physical Activity: Not on file  Social Connections: Not on file  Stress: Not on file  Tobacco Use: Low Risk  (10/12/2021)   Patient History    Smoking Tobacco Use: Never    Smokeless Tobacco Use: Never    Passive Exposure: Never  Transportation Needs: Unmet Transportation Needs (10/12/2021)   PRAPARE - Hydrologist (Medical): Yes    Lack of Transportation (Non-Medical): Yes   Westley Hummer, MSW, [place]  [phone]- work cell phone  (preferred) [phone]- desk phone

## 2021-10-27 ENCOUNTER — Inpatient Hospital Stay (HOSPITAL_BASED_OUTPATIENT_CLINIC_OR_DEPARTMENT_OTHER): Payer: Self-pay | Admitting: Hematology and Oncology

## 2021-10-27 ENCOUNTER — Inpatient Hospital Stay: Payer: Self-pay | Attending: Hematology and Oncology

## 2021-10-27 ENCOUNTER — Other Ambulatory Visit: Payer: Self-pay

## 2021-10-27 VITALS — BP 175/89 | HR 65 | Temp 97.8°F | Resp 14 | Wt 175.8 lb

## 2021-10-27 DIAGNOSIS — N920 Excessive and frequent menstruation with regular cycle: Secondary | ICD-10-CM | POA: Insufficient documentation

## 2021-10-27 DIAGNOSIS — D500 Iron deficiency anemia secondary to blood loss (chronic): Secondary | ICD-10-CM | POA: Insufficient documentation

## 2021-10-27 DIAGNOSIS — Z79899 Other long term (current) drug therapy: Secondary | ICD-10-CM | POA: Insufficient documentation

## 2021-10-27 DIAGNOSIS — R21 Rash and other nonspecific skin eruption: Secondary | ICD-10-CM | POA: Insufficient documentation

## 2021-10-27 LAB — CBC WITH DIFFERENTIAL (CANCER CENTER ONLY)
Abs Immature Granulocytes: 0.01 10*3/uL (ref 0.00–0.07)
Basophils Absolute: 0 10*3/uL (ref 0.0–0.1)
Basophils Relative: 1 %
Eosinophils Absolute: 0.1 10*3/uL (ref 0.0–0.5)
Eosinophils Relative: 2 %
HCT: 37.7 % (ref 36.0–46.0)
Hemoglobin: 13.1 g/dL (ref 12.0–15.0)
Immature Granulocytes: 0 %
Lymphocytes Relative: 38 %
Lymphs Abs: 1.7 10*3/uL (ref 0.7–4.0)
MCH: 30.6 pg (ref 26.0–34.0)
MCHC: 34.7 g/dL (ref 30.0–36.0)
MCV: 88.1 fL (ref 80.0–100.0)
Monocytes Absolute: 0.4 10*3/uL (ref 0.1–1.0)
Monocytes Relative: 9 %
Neutro Abs: 2.3 10*3/uL (ref 1.7–7.7)
Neutrophils Relative %: 50 %
Platelet Count: 243 10*3/uL (ref 150–400)
RBC: 4.28 MIL/uL (ref 3.87–5.11)
RDW: 12.9 % (ref 11.5–15.5)
WBC Count: 4.5 10*3/uL (ref 4.0–10.5)
nRBC: 0 % (ref 0.0–0.2)

## 2021-10-27 LAB — CMP (CANCER CENTER ONLY)
ALT: 19 U/L (ref 0–44)
AST: 16 U/L (ref 15–41)
Albumin: 4.4 g/dL (ref 3.5–5.0)
Alkaline Phosphatase: 60 U/L (ref 38–126)
Anion gap: 7 (ref 5–15)
BUN: 16 mg/dL (ref 6–20)
CO2: 31 mmol/L (ref 22–32)
Calcium: 9.5 mg/dL (ref 8.9–10.3)
Chloride: 101 mmol/L (ref 98–111)
Creatinine: 0.72 mg/dL (ref 0.44–1.00)
GFR, Estimated: >60 mL/min (ref >60)
Glucose, Bld: 178 mg/dL — ABNORMAL HIGH (ref 70–99)
Potassium: 3.3 mmol/L — ABNORMAL LOW (ref 3.5–5.1)
Sodium: 139 mmol/L (ref 135–145)
Total Bilirubin: 0.2 mg/dL — ABNORMAL LOW (ref 0.3–1.2)
Total Protein: 7.7 g/dL (ref 6.5–8.1)

## 2021-10-27 LAB — RETIC PANEL
Immature Retic Fract: 10 % (ref 2.3–15.9)
RBC.: 4.27 MIL/uL (ref 3.87–5.11)
Retic Count, Absolute: 55.9 10*3/uL (ref 19.0–186.0)
Retic Ct Pct: 1.3 % (ref 0.4–3.1)
Reticulocyte Hemoglobin: 34.5 pg (ref >27.9)

## 2021-10-27 LAB — IRON AND IRON BINDING CAPACITY (CC-WL,HP ONLY)
Iron: 63 ug/dL (ref 28–170)
Saturation Ratios: 18 % (ref 10.4–31.8)
TIBC: 346 ug/dL (ref 250–450)
UIBC: 283 ug/dL (ref 148–442)

## 2021-10-27 LAB — FERRITIN: Ferritin: 34 ng/mL (ref 11–307)

## 2021-10-27 MED ORDER — HYDROCORTISONE 1 % EX OINT
1.0000 | TOPICAL_OINTMENT | Freq: Two times a day (BID) | CUTANEOUS | 0 refills | Status: DC
  Filled 2021-10-27: qty 30, 10d supply, fill #0
  Filled 2022-02-21: qty 28, 9d supply, fill #0

## 2021-10-27 NOTE — Progress Notes (Signed)
[place] [phone]) [phone]   [phone]) [phone]  PROGRESS NOTE  Patient Care Team: Camillia Herter, NP as PCP - General (Nurse Practitioner) Janina Mayo, MD as PCP - Cardiology (Cardiology)  Hematological/Oncological History # Iron Deficiency Anemia 2/2 to GYN Bleeding 10/20/2020: WBC 4.0, Hgb 12.0, MCV 86.4, Plt 250 03/25/2021: WBC 9.4, Hgb 9.7, MCV 86.3, Plt 430 03/29/2021: establish care with Dr. Lorenso Courier  04/04/2021-04/11/2021: IV feraheme 510 mg x 2 doses  05/18/2021: Laparoscopic hysterectomy  10/27/2021: WBC 4.5, Hgb 13.1, MCV 88.1, Plt 243  Interval History:  Crystal Vasquez 51 y.o. female with medical history significant for iron deficiency anemia secondary to heavy menstrual bleeding who presents for a follow up visit. The patient's last visit was on 05/06/2021. In the interim since the last visit she underwent laparoscopic hysterectomy on 05/18/2021.   On exam today Crystal Vasquez reports she feels much better after having undergone hysterectomy.  She unfortunately did have complications and was septic after the procedure.  Her temperature went up to 104 F.  She reports that since then she has been all good.  She does not have any residual symptoms.  She reports that she continues taking iron pills and that do not cause any constipation or stomach upset.  She reports her energy today is a 10 out of 10.  She is eating red meat approximate 3 times a week and eats spinach and greens on a regular basis.  She is overall delighted by the improvement in her health.  She reports no fevers, chills, sweats, nausea, ming or diarrhea.  A full 10 point ROS is listed below.  MEDICAL HISTORY:  Past Medical History:  Diagnosis Date   Anemia    Asthma    due to acid reflux   Depression    DM2    Dyspnea    GERD (gastroesophageal reflux disease)    Headache    Heart murmur    benign   Hypertension    Obesity (BMI 30-39.9)    Thyroid disease     SURGICAL  HISTORY: Past Surgical History:  Procedure Laterality Date   BIOPSY THYROID     IR ANGIOGRAM PELVIS SELECTIVE OR SUPRASELECTIVE  05/09/2021   IR ANGIOGRAM SELECTIVE EACH ADDITIONAL VESSEL  05/09/2021   IR AORTAGRAM ABDOMINAL SERIALOGRAM  05/09/2021   IR EMBO TUMOR ORGAN ISCHEMIA INFARCT INC GUIDE ROADMAPPING  05/09/2021   IR RADIOLOGIST EVAL & MGMT  04/11/2021   IR US GUIDE VASC ACCESS LEFT  05/09/2021   ROBOTIC ASSISTED LAPAROSCOPIC HYSTERECTOMY AND SALPINGECTOMY Bilateral 05/18/2021   Procedure: XI ROBOTIC ASSISTED LAPAROSCOPIC HYSTERECTOMY AND SALPINGECTOMY, UTERUS MORE THAN 250 GRAMS;  Surgeon: Lafonda Mosses, MD;  Location: [facility];  Service: Gynecology;  Laterality: Bilateral;    SOCIAL HISTORY: Social History   Socioeconomic History   Marital status: Single    Spouse name: Not on file   Number of children: Not on file   Years of education: Not on file   Highest education level: Not on file  Occupational History   Not on file  Tobacco Use   Smoking status: Never    Passive exposure: Never   Smokeless tobacco: Never  Vaping Use   Vaping Use: Never used  Substance and Sexual Activity   Alcohol use: Not Currently   Drug use: Never   Sexual activity: Not Currently  Other Topics Concern   Not on file  Social History Narrative   Not on file   Social Determinants of  Health   Financial Resource Strain: High Risk (10/12/2021)   Overall Financial Resource Strain (CARDIA)    Difficulty of Paying Living Expenses: Very hard  Food Insecurity: Food Insecurity Present (10/12/2021)   Hunger Vital Sign    Worried About Running Out of Food in the Last Year: Often true    Ran Out of Food in the Last Year: Often true  Transportation Needs: Unmet Transportation Needs (10/12/2021)   PRAPARE - Hydrologist (Medical): Yes    Lack of Transportation (Non-Medical): Yes  Physical Activity: Not on file  Stress: Not on file  Social Connections: Not on file   Intimate Partner Violence: Not on file    FAMILY HISTORY: Family History  Problem Relation Age of Onset   Diabetes Mother    Hypertension Mother    Diabetes Father    Hypertension Father    Endometrial cancer Maternal Aunt    Colon cancer Neg Hx    Breast cancer Neg Hx    Ovarian cancer Neg Hx    Pancreatic cancer Neg Hx    Prostate cancer Neg Hx     ALLERGIES:  has No Known Allergies.  MEDICATIONS:  Current Outpatient Medications  Medication Sig Dispense Refill   hydrocortisone 1 % ointment Apply 1 Application topically 2 (two) times daily. 30 g 0   ACETAMINOPHEN EXTRA STRENGTH 500 MG capsule SMARTSIG:2 Capsule(s) By Mouth Daily PRN     amLODipine (NORVASC) 10 MG tablet Take 1 tablet (10 mg total) by mouth daily. 90 tablet 0   bisoprolol (ZEBETA) 10 MG tablet Take 1 tablet (10 mg total) by mouth daily. 90 tablet 3   ferrous sulfate 325 (65 FE) MG tablet Take 325 mg by mouth in the morning.     fluticasone (VERAMYST) 27.5 MCG/SPRAY nasal spray Place 1 spray into the nose in the morning.     glipiZIDE (GLUCOTROL) 10 MG tablet Take 1 tablet (10 mg total) by mouth 2 (two) times daily before a meal. 180 tablet 0   hydrALAZINE (APRESOLINE) 25 MG tablet Take 1 tablet (25 mg total) by mouth 3 (three) times daily. 90 tablet 2   hydrochlorothiazide (HYDRODIURIL) 25 MG tablet Take 1 tablet (25 mg total) by mouth daily. 90 tablet 0   LASIX 20 MG tablet Take 20 mg by mouth daily as needed.     meloxicam (MOBIC) 15 MG tablet Take 15 mg by mouth daily as needed.     metFORMIN (GLUCOPHAGE) 1000 MG tablet Take 1 tablet (1,000 mg total) by mouth 2 (two) times daily with a meal. 180 tablet 0   olmesartan (BENICAR) 40 MG tablet Take 1 tablet (40 mg total) by mouth daily. 90 tablet 3   omeprazole (PRILOSEC) 20 MG capsule Take 20 mg by mouth daily.     QUEtiapine (SEROQUEL) 50 MG tablet Take 1 tablet (50 mg total) by mouth at bedtime. 30 tablet 1   sertraline (ZOLOFT) 25 MG tablet Take 1 tablet  (25 mg total) by mouth daily. 30 tablet 0   simvastatin (ZOCOR) 20 MG tablet Take 20 mg by mouth at bedtime.     No current facility-administered medications for this visit.    REVIEW OF SYSTEMS:   Constitutional: ( - ) fevers, ( - )  chills , ( - ) night sweats Eyes: ( - ) blurriness of vision, ( - ) double vision, ( - ) watery eyes Ears, nose, mouth, throat, and face: ( - ) mucositis, ( - )  sore throat Respiratory: ( - ) cough, ( - ) dyspnea, ( - ) wheezes Cardiovascular: ( - ) palpitation, ( - ) chest discomfort, ( - ) lower extremity swelling Gastrointestinal:  ( - ) nausea, ( - ) heartburn, ( - ) change in bowel habits Skin: ( - ) abnormal skin rashes Lymphatics: ( - ) new lymphadenopathy, ( - ) easy bruising Neurological: ( - ) numbness, ( - ) tingling, ( - ) new weaknesses Behavioral/Psych: ( - ) mood change, ( - ) new changes  All other systems were reviewed with the patient and are negative.  PHYSICAL EXAMINATION:  Vitals:   10/27/21 1010  BP: (!) 175/89  Pulse: 65  Resp: 14  Temp: 97.8 F (36.6 C)  SpO2: 97%    Filed Weights   10/27/21 1010  Weight: 175 lb 12.8 oz (79.7 kg)     GENERAL: Well-appearing middle-age Caucasian female, alert, no distress and comfortable SKIN: 3 areas of erythema and raised lesions on the left leg.  Otherwise skin is within normal limits. EYES: conjunctiva are pink and non-injected, sclera clear LUNGS: clear to auscultation and percussion with normal breathing effort HEART: regular rate & rhythm and no murmurs and no lower extremity edema Musculoskeletal: no cyanosis of digits and no clubbing  PSYCH: alert & oriented x 3, fluent speech NEURO: no focal motor/sensory deficits  LABORATORY DATA:  I have reviewed the data as listed    Latest Ref Rng & Units 10/27/2021    9:56 AM 05/23/2021    4:09 AM 05/22/2021    4:15 AM  CBC  WBC 4.0 - 10.5 K/uL 4.5  5.4  5.1   Hemoglobin 12.0 - 15.0 g/dL 13.1  8.0  7.6   Hematocrit 36.0 - 46.0 %  37.7  26.5  25.1   Platelets 150 - 400 K/uL 243  397  363        Latest Ref Rng & Units 10/27/2021    9:56 AM 05/23/2021    4:09 AM 05/22/2021    4:15 AM  CMP  Glucose 70 - 99 mg/dL 178  136  168   BUN 6 - 20 mg/dL '16  10  9   '$ Creatinine 0.44 - 1.00 mg/dL 0.72  0.54  0.63   Sodium 135 - 145 mmol/L 139  137  135   Potassium 3.5 - 5.1 mmol/L 3.3  4.4  3.9   Chloride 98 - 111 mmol/L 101  104  105   CO2 22 - 32 mmol/L '31  22  22   '$ Calcium 8.9 - 10.3 mg/dL 9.5  9.0  8.5   Total Protein 6.5 - 8.1 g/dL 7.7     Total Bilirubin 0.3 - 1.2 mg/dL 0.2     Alkaline Phos 38 - 126 U/L 60     AST 15 - 41 U/L 16     ALT 0 - 44 U/L 19       RADIOGRAPHIC STUDIES: I have personally reviewed the radiological images as listed and agreed with the findings in the report. VAS US RENAL ARTERY DUPLEX  Result Date: 10/13/2021 ABDOMINAL VISCERAL Patient Name:  Crystal Vasquez  Date of Exam:   10/11/2021 Medical Rec #: [id number]             Accession #:    [id number] Date of Birth: November 16, 1970             Patient Gender: F Patient Age:   19 years Exam Location:  [facility] Procedure:  VAS US RENAL ARTERY DUPLEX Referring Phys: [id number] [place] -------------------------------------------------------------------------------- Indications: Patient has had uncontrolled hypertension about 2 years. Patient              denies abdominal and flank pain today. High Risk Factors: Hypertension, no history of smoking. Other Factors: Recent blood work 05/23/2021 BUN 10 Creatinine .54. Comparison Study: None Performing Technologist: Alecia Mackin RVT, RDCS (AE), RDMS  Examination Guidelines: A complete evaluation includes B-mode imaging, spectral Doppler, color Doppler, and power Doppler as needed of all accessible portions of each vessel. Bilateral testing is considered an integral part of a complete examination. Limited examinations for reoccurring indications may be performed as noted.  Duplex Findings:  +--------------------+--------+--------+------+--------+ Mesenteric          PSV cm/sEDV cm/sPlaqueComments +--------------------+--------+--------+------+--------+ Aorta Prox            115                          +--------------------+--------+--------+------+--------+ Aorta Distal          137                          +--------------------+--------+--------+------+--------+ Celiac Artery Origin  106                          +--------------------+--------+--------+------+--------+    +------------------+--------+--------+-------+ Right Renal ArteryPSV cm/sEDV cm/sComment +------------------+--------+--------+-------+ Origin               83      23           +------------------+--------+--------+-------+ Proximal            129      29           +------------------+--------+--------+-------+ Mid                  61      15           +------------------+--------+--------+-------+ Distal               96      29           +------------------+--------+--------+-------+ +-----------------+--------+--------+-------+ Left Renal ArteryPSV cm/sEDV cm/sComment +-----------------+--------+--------+-------+ Origin             100      29           +-----------------+--------+--------+-------+ Proximal           118      33           +-----------------+--------+--------+-------+ Mid                 78      23           +-----------------+--------+--------+-------+ Distal              98      31           +-----------------+--------+--------+-------+ +------------+--------+--------+----+-----------+--------+--------+----+ Right KidneyPSV cm/sEDV cm/sRI  Left KidneyPSV cm/sEDV cm/sRI   +------------+--------+--------+----+-----------+--------+--------+----+ Upper Pole  27      8       0.70Upper Pole 24      9       0.61 +------------+--------+--------+----+-----------+--------+--------+----+ Mid         29      8       0.73Mid         19  7       0.62 +------------+--------+--------+----+-----------+--------+--------+----+ Lower Pole  26      7       0.73Lower Pole 90      28      0.69 +------------+--------+--------+----+-----------+--------+--------+----+ Hilar       37      12      0.68Hilar      44      13      0.70 +------------+--------+--------+----+-----------+--------+--------+----+ +------------------+--------+------------------+--------+ Right Kidney              Left Kidney                +------------------+--------+------------------+--------+ RAR                       RAR                        +------------------+--------+------------------+--------+ RAR (manual)      1.1     RAR (manual)      1.0      +------------------+--------+------------------+--------+ Cortex                    Cortex                     +------------------+--------+------------------+--------+ Cortex thickness  11.00 mmCorex thickness   17.00 mm +------------------+--------+------------------+--------+ Kidney length (cm)11.0    Kidney length (cm)11.3     +------------------+--------+------------------+--------+  Summary: Largest Aortic Diameter: 2.0 cm  Renal:  Right: Normal size right kidney. Abnormal right Resistive Index.        Normal cortical thickness of right kidney. No evidence of        right renal artery stenosis. RRV flow present. Left:  Normal size of left kidney. Normal left Resistive Index.        Normal cortical thickness of the left kidney. No evidence of        left renal artery stenosis. LRV flow present. Mesenteric: Normal Celiac artery and Superior Mesenteric artery findings.  *See table(s) above for measurements and observations.  Diagnosing physician: Jenkins Rouge MD  Electronically signed by Jenkins Rouge MD on 10/13/2021 at 11:05:10 AM.    Final    ECHOCARDIOGRAM COMPLETE  Result Date: 10/10/2021    ECHOCARDIOGRAM REPORT   Patient Name:   Crystal Vasquez Date of Exam:  10/10/2021 Medical Rec #:  [id number]            Height:       66.0 in Accession #:    [id number]           Weight:       176.2 lb Date of Birth:  15-Nov-1970            BSA:          1.895 m Patient Age:    31 years             BP:           140/85 mmHg Patient Gender: F                    HR:           63 bpm. Exam Location:  Outpatient Procedure: 2D Echo, 3D Echo, Cardiac Doppler and Color Doppler Indications:    Hypertension, unspecified type  History:        Patient has no prior history of Echocardiogram examinations.  Risk Factors:Hypertension, Non-Smoker and Diabetes. Grave's                 disease.  Sonographer:    Leavy Cella RDCS Referring Phys: [place]  1. Left ventricular ejection fraction, by estimation, is 60 to 65%. The left ventricle has normal function. The left ventricle has no regional wall motion abnormalities. Left ventricular diastolic parameters were normal. The average left ventricular global longitudinal strain is -19.1 %. The global longitudinal strain is normal.  2. Right ventricular systolic function is normal. The right ventricular size is normal.  3. The mitral valve is normal in structure. Trivial mitral valve regurgitation. No evidence of mitral stenosis.  4. The aortic valve is tricuspid. Aortic valve regurgitation is not visualized. No aortic stenosis is present.  5. The inferior vena cava is normal in size with greater than 50% respiratory variability, suggesting right atrial pressure of 3 mmHg. Comparison(s): No prior Echocardiogram. FINDINGS  Left Ventricle: Left ventricular ejection fraction, by estimation, is 60 to 65%. The left ventricle has normal function. The left ventricle has no regional wall motion abnormalities. The average left ventricular global longitudinal strain is -19.1 %. The global longitudinal strain is normal. The left ventricular internal cavity size was normal in size. There is borderline left ventricular  hypertrophy. Left ventricular diastolic parameters were normal. Right Ventricle: The right ventricular size is normal. Right ventricular systolic function is normal. Left Atrium: Left atrial size was normal in size. Right Atrium: Right atrial size was normal in size. Pericardium: There is no evidence of pericardial effusion. Mitral Valve: The mitral valve is normal in structure. Trivial mitral valve regurgitation. No evidence of mitral valve stenosis. Tricuspid Valve: The tricuspid valve is normal in structure. Tricuspid valve regurgitation is trivial. No evidence of tricuspid stenosis. Aortic Valve: The aortic valve is tricuspid. Aortic valve regurgitation is not visualized. No aortic stenosis is present. Aortic valve mean gradient measures 4.0 mmHg. Aortic valve peak gradient measures 6.9 mmHg. Aortic valve area, by VTI measures 2.69 cm. Pulmonic Valve: The pulmonic valve was normal in structure. Pulmonic valve regurgitation is not visualized. No evidence of pulmonic stenosis. Aorta: The aortic root is normal in size and structure. Venous: The inferior vena cava is normal in size with greater than 50% respiratory variability, suggesting right atrial pressure of 3 mmHg. IAS/Shunts: No atrial level shunt detected by color flow Doppler.  LEFT VENTRICLE PLAX 2D LVIDd:         3.72 cm   Diastology LVIDs:         2.14 cm   LV e' medial:    7.18 cm/s LV PW:         1.34 cm   LV E/e' medial:  9.9 LV IVS:        1.18 cm   LV e' lateral:   7.62 cm/s LVOT diam:     2.00 cm   LV E/e' lateral: 9.4 LV SV:         76 LV SV Index:   40        2D Longitudinal Strain LVOT Area:     3.14 cm  2D Strain GLS Avg:     -19.1 %                           3D Volume EF:  3D EF:        65 %                          LV EDV:       104 ml                          LV ESV:       36 ml                          LV SV:        68 ml RIGHT VENTRICLE RV Basal diam:  3.07 cm RV Mid diam:    2.40 cm RV S prime:     12.20 cm/s  TAPSE (M-mode): 2.0 cm LEFT ATRIUM             Index        RIGHT ATRIUM           Index LA diam:        2.70 cm 1.42 cm/m   RA Area:     13.40 cm LA Vol (A2C):   39.6 ml 20.90 ml/m  RA Volume:   30.80 ml  16.25 ml/m LA Vol (A4C):   29.9 ml 15.78 ml/m LA Biplane Vol: 34.9 ml 18.42 ml/m  AORTIC VALVE AV Area (Vmax):    2.66 cm AV Area (Vmean):   2.48 cm AV Area (VTI):     2.69 cm AV Vmax:           131.00 cm/s AV Vmean:          86.200 cm/s AV VTI:            0.281 m AV Peak Grad:      6.9 mmHg AV Mean Grad:      4.0 mmHg LVOT Vmax:         111.00 cm/s LVOT Vmean:        68.100 cm/s LVOT VTI:          0.241 m LVOT/AV VTI ratio: 0.86  AORTA Ao Root diam: 2.60 cm Ao Asc diam:  2.50 cm MITRAL VALVE MV Area (PHT): 3.53 cm    SHUNTS MV Decel Time: 215 msec    Systemic VTI:  0.24 m MV E velocity: 71.30 cm/s  Systemic Diam: 2.00 cm MV A velocity: 77.60 cm/s MV E/A ratio:  0.92 Kirk Ruths MD Electronically signed by Kirk Ruths MD Signature Date/Time: 10/10/2021/12:58:33 PM    Final     ASSESSMENT & PLAN Crystal Vasquez 51 y.o. female with medical history significant for iron deficiency anemia secondary to heavy menstrual bleeding who presents for a follow up visit.   After review of the labs, review of the records, and discussion with the patient the patients findings are most consistent with iron deficiency anemia secondary to GYN bleeding.  The patient has a known prolapsing fibroid for which she is being followed by Dr. Berline Lopes.  She underwent surgery on 05/18/2021 for successfully removed the prolapsing fibroid and removed the uterus.  She has had no bleeding since that time.  Her energy levels are excellent and her hemoglobin has rebounded to normal levels.  Overall she feels quite well.   # Iron Deficiency Anemia 2/2 to GYN Bleeding -- Findings are consistent with iron deficiency anemia secondary to patient's heavy menstrual bleeding due to prolapsing fibroid. --She underwent laparoscopic  hysterectomy on 05/18/2021.  She has had no subsequent bleeding since that time. --today will repeat iron panel and ferritin as well as CBC, and CMP --Continue ferrous sulfate 325 mg daily with a source of vitamin C.  Pending results of today's iron labs will determine if further p.o. iron therapy is required. --Labs today show a blood cell count 4.5, hemoglobin 13.1, MCV 88.1, and platelets of 243 --RTC PRN   #Left Lower Extremity Rash --apply hydrocortisone ointment 1% as prescribed to affected area.  --If failure to resolve would recommend evaluation with PCP.    No orders of the defined types were placed in this encounter.   All questions were answered. The patient knows to call the clinic with any problems, questions or concerns.  A total of more than 30 minutes were spent on this encounter with face-to-face time and non-face-to-face time, including preparing to see the patient, ordering tests and/or medications, counseling the patient and coordination of care as outlined above.   Ledell Peoples, MD Department of Hematology/Oncology [facility] Phone: [phone] Pager: [phone] Email: [email or web address]  10/27/2021 10:52 AM

## 2021-10-31 ENCOUNTER — Other Ambulatory Visit: Payer: Self-pay

## 2021-10-31 ENCOUNTER — Ambulatory Visit (INDEPENDENT_AMBULATORY_CARE_PROVIDER_SITE_OTHER): Payer: Self-pay | Admitting: Nurse Practitioner

## 2021-10-31 ENCOUNTER — Encounter: Payer: Self-pay | Admitting: Nurse Practitioner

## 2021-10-31 VITALS — BP 136/90 | HR 79 | Temp 98.4°F | Ht 66.0 in | Wt 176.4 lb

## 2021-10-31 DIAGNOSIS — G4719 Other hypersomnia: Secondary | ICD-10-CM | POA: Insufficient documentation

## 2021-10-31 DIAGNOSIS — R0683 Snoring: Secondary | ICD-10-CM

## 2021-10-31 NOTE — Patient Instructions (Signed)
We discussed how untreated sleep apnea puts an individual at risk for cardiac arrhthymias, pulm HTN, DM, stroke and increases their risk for daytime accidents. We also briefly reviewed treatment options including weight loss, side sleeping position, oral appliance, CPAP therapy or referral to ENT for possible surgical options  Home sleep study ordered for evaluation of sleep apnea - someone will contact you for scheduling.  Please call to schedule your follow up for 2 weeks after your sleep study to review results. If symptoms do not improve or worsen, please contact office for sooner follow up or seek emergency care.

## 2021-10-31 NOTE — Assessment & Plan Note (Signed)
She has snoring, excessive daytime sleepiness, and nocturnal apneic events. Given this, I am concerned she could have sleep disordered breathing with obstructive sleep apnea. She will need home sleep study for further evaluation.   - discussed how weight can impact sleep and risk for sleep disordered breathing - discussed options to assist with weight loss: combination of diet modification, cardiovascular and strength training exercises  - had an extensive discussion regarding the adverse health consequences related to untreated sleep disordered breathing - specifically discussed the risks for hypertension, coronary artery disease, cardiac dysrhythmias, cerebrovascular disease, and diabetes - lifestyle modification discussed  - discussed how sleep disruption can increase risk of accidents, particularly when driving - safe driving practices were discussed  Patient Instructions  We discussed how untreated sleep apnea puts an individual at risk for cardiac arrhthymias, pulm HTN, DM, stroke and increases their risk for daytime accidents. We also briefly reviewed treatment options including weight loss, side sleeping position, oral appliance, CPAP therapy or referral to ENT for possible surgical options  Home sleep study ordered for evaluation of sleep apnea - someone will contact you for scheduling.  Please call to schedule your follow up for 2 weeks after your sleep study to review results. If symptoms do not improve or worsen, please contact office for sooner follow up or seek emergency care.

## 2021-10-31 NOTE — Progress Notes (Signed)
$'@Patient'D$  ID: Crystal Vasquez, female    DOB: [birth year], 51 y.o.   MRN: [id number]  Chief Complaint  Patient presents with   Consult    Referring provider: Janina Mayo, MD  HPI: 51 year old female, never smoker referred for sleep consult by Dr. Harl Bowie. Past medical history significant for HTN, DM, IDA.   TEST/EVENTS:   10/31/2021: Today - sleep consult Patient presents today for sleep consult. She has a longstanding history of snoring and over the past few years, has started having night awakenings where she is gasping for air. She did feel like during one of these episodes, she couldn't move but thinks this is related to air hunger. She wakes in the morning feeling like she slept ok but has daytime fatigue symptoms. She was told in the past that she talks in her sleep. Denies other sleep parasomnias, morning headaches, drowsy driving, narcolepsy or cataplexy. She goes to bed around 10 pm. She takes seroquel for PTSD/anxiety and to help her fall asleep. Despite this, there are nights where it can take her 3-4 hours to fall asleep. Otherwise, can be within an hour. She wakes 2-3 times a night, sometimes to use the restroom and other times for no reason. She finally gets out of the bed in the morning around 5 am. She drinks a cup of coffee in the AM. Works as a Research scientist (life sciences). Does not use heavy machinery in her job. Weight has been stable over the past few years. No previous sleep studies.   Epworth 4  No Known Allergies   There is no immunization history on file for this patient.  Past Medical History:  Diagnosis Date   Anemia    Asthma    due to acid reflux   Depression    DM2    Dyspnea    GERD (gastroesophageal reflux disease)    Headache    Heart murmur    benign   Hypertension    Obesity (BMI 30-39.9)    Thyroid disease     Tobacco History: Social History   Tobacco Use  Smoking Status Never   Passive exposure: Never  Smokeless Tobacco Never    Counseling given: Not Answered   Outpatient Medications Prior to Visit  Medication Sig Dispense Refill   ACETAMINOPHEN EXTRA STRENGTH 500 MG capsule SMARTSIG:2 Capsule(s) By Mouth Daily PRN     amLODipine (NORVASC) 10 MG tablet Take 1 tablet (10 mg total) by mouth daily. 90 tablet 0   bisoprolol (ZEBETA) 10 MG tablet Take 1 tablet (10 mg total) by mouth daily. 90 tablet 3   ferrous sulfate 325 (65 FE) MG tablet Take 325 mg by mouth in the morning.     fluticasone (VERAMYST) 27.5 MCG/SPRAY nasal spray Place 1 spray into the nose in the morning.     glipiZIDE (GLUCOTROL) 10 MG tablet Take 1 tablet (10 mg total) by mouth 2 (two) times daily before a meal. 180 tablet 0   hydrALAZINE (APRESOLINE) 25 MG tablet Take 1 tablet (25 mg total) by mouth 3 (three) times daily. 90 tablet 2   hydrochlorothiazide (HYDRODIURIL) 25 MG tablet Take 1 tablet (25 mg total) by mouth daily. 90 tablet 0   hydrocortisone 1 % ointment Apply 1 Application topically 2 (two) times daily. 30 g 0   LASIX 20 MG tablet Take 20 mg by mouth daily as needed.     meloxicam (MOBIC) 15 MG tablet Take 15 mg by mouth daily as needed.  metFORMIN (GLUCOPHAGE) 1000 MG tablet Take 1 tablet (1,000 mg total) by mouth 2 (two) times daily with a meal. 180 tablet 0   olmesartan (BENICAR) 40 MG tablet Take 1 tablet (40 mg total) by mouth daily. 90 tablet 3   omeprazole (PRILOSEC) 20 MG capsule Take 20 mg by mouth daily.     QUEtiapine (SEROQUEL) 50 MG tablet Take 1 tablet (50 mg total) by mouth at bedtime. 30 tablet 1   sertraline (ZOLOFT) 25 MG tablet Take 1 tablet (25 mg total) by mouth daily. 30 tablet 0   simvastatin (ZOCOR) 20 MG tablet Take 20 mg by mouth at bedtime.     No facility-administered medications prior to visit.     Review of Systems:   Constitutional: No weight loss or gain, night sweats, fevers, chills. +excessive daytime fatigue HEENT: No headaches, difficulty swallowing, tooth/dental problems, or sore throat.  No sneezing, itching, ear ache, nasal congestion, or post nasal drip CV:  No chest pain, orthopnea, PND, swelling in lower extremities, anasarca, dizziness, palpitations, syncope Resp: +snoring, nocturnal apneas/gasping. No shortness of breath with exertion or at rest. No excess mucus or change in color of mucus. No productive or non-productive. No hemoptysis. No wheezing.  No chest wall deformity GU: No dysuria, change in color of urine, urgency or frequency.  No flank pain, no hematuria  Skin: No rash, lesions, ulcerations Neuro: No dizziness or lightheadedness.  Psych: +anxiety, PTSD, sleep disturbance. Mood stable.     Physical Exam:  BP (!) 136/90 (BP Location: Right Arm, Patient Position: Sitting, Cuff Size: Normal)   Pulse 79   Temp 98.4 F (36.9 C) (Oral)   Ht '5\' 6"'$  (1.676 m)   Wt 176 lb 6.4 oz (80 kg)   LMP 04/27/2021 (Exact Date)   SpO2 99%   BMI 28.47 kg/m   GEN: Pleasant, interactive, well-appearing; in no acute distress. HEENT:  Normocephalic and atraumatic. PERRLA. Sclera white. Nasal turbinates pink, moist and patent bilaterally. No rhinorrhea present. Oropharynx pink and moist, without exudate or edema. No lesions, ulcerations, or postnasal drip. Mallampati I NECK:  Supple w/ fair ROM.  CV: RRR, no m/r/g, no peripheral edema. Pulses intact, +2 bilaterally. No cyanosis, pallor or clubbing. PULMONARY:  Unlabored, regular breathing. Clear bilaterally A&P w/o wheezes/rales/rhonchi. No accessory muscle use. No dullness to percussion. GI: BS present and normoactive. Soft, non-tender to palpation. No organomegaly or masses detected. No CVA tenderness. MSK: No erythema, warmth or tenderness. Cap refil <2 sec all extrem. No deformities or joint swelling noted.  Neuro: A/Ox3. No focal deficits noted.   Skin: Warm, no lesions or rashe Psych: Normal affect and behavior. Judgement and thought content appropriate.     Lab Results:  CBC    Component Value Date/Time   WBC 4.5  10/27/2021 0956   WBC 5.4 05/23/2021 0409   RBC 4.28 10/27/2021 0956   RBC 4.27 10/27/2021 0955   HGB 13.1 10/27/2021 0956   HGB 9.3 (L) 11/11/2020 1545   HCT 37.7 10/27/2021 0956   HCT 28.5 (L) 11/11/2020 1545   PLT 243 10/27/2021 0956   PLT 430 11/11/2020 1545   MCV 88.1 10/27/2021 0956   MCV 87 11/11/2020 1545   MCH 30.6 10/27/2021 0956   MCHC 34.7 10/27/2021 0956   RDW 12.9 10/27/2021 0956   RDW 19.1 (H) 11/11/2020 1545   LYMPHSABS 1.7 10/27/2021 0956   LYMPHSABS 1.9 11/11/2020 1545   MONOABS 0.4 10/27/2021 0956   EOSABS 0.1 10/27/2021 0956   EOSABS 0.1  11/11/2020 1545   BASOSABS 0.0 10/27/2021 0956   BASOSABS 0.0 11/11/2020 1545    BMET    Component Value Date/Time   NA 139 10/27/2021 0956   K 3.3 (L) 10/27/2021 0956   CL 101 10/27/2021 0956   CO2 31 10/27/2021 0956   GLUCOSE 178 (H) 10/27/2021 0956   BUN 16 10/27/2021 0956   CREATININE 0.72 10/27/2021 0956   CALCIUM 9.5 10/27/2021 0956   GFRNONAA >60 10/27/2021 0956    BNP No results found for: "BNP"   Imaging:  VAS US RENAL ARTERY DUPLEX  Result Date: 10/13/2021 ABDOMINAL VISCERAL Patient Name:  CRYSTALMARIE YASIN  Date of Exam:   10/11/2021 Medical Rec #: [id number]             Accession #:    [id number] Date of Birth: 1970/11/19             Patient Gender: F Patient Age:   22 years Exam Location:  [facility] Procedure:      VAS US RENAL ARTERY DUPLEX Referring Phys: Bardolph -------------------------------------------------------------------------------- Indications: Patient has had uncontrolled hypertension about 2 years. Patient              denies abdominal and flank pain today. High Risk Factors: Hypertension, no history of smoking. Other Factors: Recent blood work 05/23/2021 BUN 10 Creatinine .54. Comparison Study: None Performing Technologist: Alecia Mackin RVT, RDCS (AE), RDMS  Examination Guidelines: A complete evaluation includes B-mode imaging, spectral Doppler, color Doppler, and power  Doppler as needed of all accessible portions of each vessel. Bilateral testing is considered an integral part of a complete examination. Limited examinations for reoccurring indications may be performed as noted.  Duplex Findings: +--------------------+--------+--------+------+--------+ Mesenteric          PSV cm/sEDV cm/sPlaqueComments +--------------------+--------+--------+------+--------+ Aorta Prox            115                          +--------------------+--------+--------+------+--------+ Aorta Distal          137                          +--------------------+--------+--------+------+--------+ Celiac Artery Origin  106                          +--------------------+--------+--------+------+--------+    +------------------+--------+--------+-------+ Right Renal ArteryPSV cm/sEDV cm/sComment +------------------+--------+--------+-------+ Origin               83      23           +------------------+--------+--------+-------+ Proximal            129      29           +------------------+--------+--------+-------+ Mid                  61      15           +------------------+--------+--------+-------+ Distal               96      29           +------------------+--------+--------+-------+ +-----------------+--------+--------+-------+ Left Renal ArteryPSV cm/sEDV cm/sComment +-----------------+--------+--------+-------+ Origin             100      29           +-----------------+--------+--------+-------+ Proximal  118      33           +-----------------+--------+--------+-------+ Mid                 78      23           +-----------------+--------+--------+-------+ Distal              98      31           +-----------------+--------+--------+-------+ +------------+--------+--------+----+-----------+--------+--------+----+ Right KidneyPSV cm/sEDV cm/sRI  Left KidneyPSV cm/sEDV cm/sRI    +------------+--------+--------+----+-----------+--------+--------+----+ Upper Pole  27      8       0.70Upper Pole 24      9       0.61 +------------+--------+--------+----+-----------+--------+--------+----+ Mid         29      8       0.73Mid        19      7       0.62 +------------+--------+--------+----+-----------+--------+--------+----+ Lower Pole  26      7       0.73Lower Pole 90      28      0.69 +------------+--------+--------+----+-----------+--------+--------+----+ Hilar       37      12      0.68Hilar      44      13      0.70 +------------+--------+--------+----+-----------+--------+--------+----+ +------------------+--------+------------------+--------+ Right Kidney              Left Kidney                +------------------+--------+------------------+--------+ RAR                       RAR                        +------------------+--------+------------------+--------+ RAR (manual)      1.1     RAR (manual)      1.0      +------------------+--------+------------------+--------+ Cortex                    Cortex                     +------------------+--------+------------------+--------+ Cortex thickness  11.00 mmCorex thickness   17.00 mm +------------------+--------+------------------+--------+ Kidney length (cm)11.0    Kidney length (cm)11.3     +------------------+--------+------------------+--------+  Summary: Largest Aortic Diameter: 2.0 cm  Renal:  Right: Normal size right kidney. Abnormal right Resistive Index.        Normal cortical thickness of right kidney. No evidence of        right renal artery stenosis. RRV flow present. Left:  Normal size of left kidney. Normal left Resistive Index.        Normal cortical thickness of the left kidney. No evidence of        left renal artery stenosis. LRV flow present. Mesenteric: Normal Celiac artery and Superior Mesenteric artery findings.  *See table(s) above for measurements and  observations.  Diagnosing physician: Jenkins Rouge MD  Electronically signed by Jenkins Rouge MD on 10/13/2021 at 11:05:10 AM.    Final    ECHOCARDIOGRAM COMPLETE  Result Date: 10/10/2021    ECHOCARDIOGRAM REPORT   Patient Name:   LUCIANN GOSSETT Date of Exam: 10/10/2021 Medical Rec #:  [id number]            Height:  66.0 in Accession #:    [id number]           Weight:       176.2 lb Date of Birth:  [birth year]            BSA:          1.895 m Patient Age:    19 years             BP:           140/85 mmHg Patient Gender: F                    HR:           63 bpm. Exam Location:  Outpatient Procedure: 2D Echo, 3D Echo, Cardiac Doppler and Color Doppler Indications:    Hypertension, unspecified type  History:        Patient has no prior history of Echocardiogram examinations.                 Risk Factors:Hypertension, Non-Smoker and Diabetes. Grave's                 disease.  Sonographer:    Leavy Cella RDCS Referring Phys: [place]  1. Left ventricular ejection fraction, by estimation, is 60 to 65%. The left ventricle has normal function. The left ventricle has no regional wall motion abnormalities. Left ventricular diastolic parameters were normal. The average left ventricular global longitudinal strain is -19.1 %. The global longitudinal strain is normal.  2. Right ventricular systolic function is normal. The right ventricular size is normal.  3. The mitral valve is normal in structure. Trivial mitral valve regurgitation. No evidence of mitral stenosis.  4. The aortic valve is tricuspid. Aortic valve regurgitation is not visualized. No aortic stenosis is present.  5. The inferior vena cava is normal in size with greater than 50% respiratory variability, suggesting right atrial pressure of 3 mmHg. Comparison(s): No prior Echocardiogram. FINDINGS  Left Ventricle: Left ventricular ejection fraction, by estimation, is 60 to 65%. The left ventricle has normal function. The left  ventricle has no regional wall motion abnormalities. The average left ventricular global longitudinal strain is -19.1 %. The global longitudinal strain is normal. The left ventricular internal cavity size was normal in size. There is borderline left ventricular hypertrophy. Left ventricular diastolic parameters were normal. Right Ventricle: The right ventricular size is normal. Right ventricular systolic function is normal. Left Atrium: Left atrial size was normal in size. Right Atrium: Right atrial size was normal in size. Pericardium: There is no evidence of pericardial effusion. Mitral Valve: The mitral valve is normal in structure. Trivial mitral valve regurgitation. No evidence of mitral valve stenosis. Tricuspid Valve: The tricuspid valve is normal in structure. Tricuspid valve regurgitation is trivial. No evidence of tricuspid stenosis. Aortic Valve: The aortic valve is tricuspid. Aortic valve regurgitation is not visualized. No aortic stenosis is present. Aortic valve mean gradient measures 4.0 mmHg. Aortic valve peak gradient measures 6.9 mmHg. Aortic valve area, by VTI measures 2.69 cm. Pulmonic Valve: The pulmonic valve was normal in structure. Pulmonic valve regurgitation is not visualized. No evidence of pulmonic stenosis. Aorta: The aortic root is normal in size and structure. Venous: The inferior vena cava is normal in size with greater than 50% respiratory variability, suggesting right atrial pressure of 3 mmHg. IAS/Shunts: No atrial level shunt detected by color flow Doppler.  LEFT VENTRICLE PLAX 2D LVIDd:  3.72 cm   Diastology LVIDs:         2.14 cm   LV e' medial:    7.18 cm/s LV PW:         1.34 cm   LV E/e' medial:  9.9 LV IVS:        1.18 cm   LV e' lateral:   7.62 cm/s LVOT diam:     2.00 cm   LV E/e' lateral: 9.4 LV SV:         76 LV SV Index:   40        2D Longitudinal Strain LVOT Area:     3.14 cm  2D Strain GLS Avg:     -19.1 %                           3D Volume EF:                           3D EF:        65 %                          LV EDV:       104 ml                          LV ESV:       36 ml                          LV SV:        68 ml RIGHT VENTRICLE RV Basal diam:  3.07 cm RV Mid diam:    2.40 cm RV S prime:     12.20 cm/s TAPSE (M-mode): 2.0 cm LEFT ATRIUM             Index        RIGHT ATRIUM           Index LA diam:        2.70 cm 1.42 cm/m   RA Area:     13.40 cm LA Vol (A2C):   39.6 ml 20.90 ml/m  RA Volume:   30.80 ml  16.25 ml/m LA Vol (A4C):   29.9 ml 15.78 ml/m LA Biplane Vol: 34.9 ml 18.42 ml/m  AORTIC VALVE AV Area (Vmax):    2.66 cm AV Area (Vmean):   2.48 cm AV Area (VTI):     2.69 cm AV Vmax:           131.00 cm/s AV Vmean:          86.200 cm/s AV VTI:            0.281 m AV Peak Grad:      6.9 mmHg AV Mean Grad:      4.0 mmHg LVOT Vmax:         111.00 cm/s LVOT Vmean:        68.100 cm/s LVOT VTI:          0.241 m LVOT/AV VTI ratio: 0.86  AORTA Ao Root diam: 2.60 cm Ao Asc diam:  2.50 cm MITRAL VALVE MV Area (PHT): 3.53 cm    SHUNTS MV Decel Time: 215 msec    Systemic VTI:  0.24 m MV E velocity: 71.30 cm/s  Systemic Diam: 2.00 cm MV A velocity: 77.60 cm/s  MV E/A ratio:  0.92 Kirk Ruths MD Electronically signed by Kirk Ruths MD Signature Date/Time: 10/10/2021/12:58:33 PM    Final           No data to display          No results found for: "NITRICOXIDE"      Assessment & Plan:   Excessive daytime sleepiness She has snoring, excessive daytime sleepiness, and nocturnal apneic events. Given this,  I am concerned she could have sleep disordered breathing with obstructive sleep apnea. She will need home sleep study for further evaluation.    - discussed how weight can impact sleep and risk for sleep disordered breathing - discussed options to assist with weight loss: combination of diet modification, cardiovascular and strength training exercises   - had an extensive discussion regarding the adverse health consequences related to  untreated sleep disordered breathing - specifically discussed the risks for hypertension, coronary artery disease, cardiac dysrhythmias, cerebrovascular disease, and diabetes - lifestyle modification discussed   - discussed how sleep disruption can increase risk of accidents, particularly when driving - safe driving practices were discussed  Patient Instructions  We discussed how untreated sleep apnea puts an individual at risk for cardiac arrhthymias, pulm HTN, DM, stroke and increases their risk for daytime accidents. We also briefly reviewed treatment options including weight loss, side sleeping position, oral appliance, CPAP therapy or referral to ENT for possible surgical options  Home sleep study ordered for evaluation of sleep apnea - someone will contact you for scheduling.  Please call to schedule your follow up for 2 weeks after your sleep study to review results. If symptoms do not improve or worsen, please contact office for sooner follow up or seek emergency care.     I spent 35 minutes of dedicated to the care of this patient on the date of this encounter to include pre-visit review of records, face-to-face time with the patient discussing conditions above, post visit ordering of testing, clinical documentation with the electronic health record, making appropriate referrals as documented, and communicating necessary findings to members of the patients care team.  Clayton Bibles, NP 10/31/2021  Pt aware and understands NP's role.

## 2021-11-04 ENCOUNTER — Other Ambulatory Visit: Payer: Self-pay

## 2021-11-04 ENCOUNTER — Ambulatory Visit: Payer: Self-pay | Admitting: Hematology and Oncology

## 2021-11-07 ENCOUNTER — Other Ambulatory Visit (HOSPITAL_COMMUNITY): Payer: Self-pay | Admitting: Student in an Organized Health Care Education/Training Program

## 2021-11-07 DIAGNOSIS — F331 Major depressive disorder, recurrent, moderate: Secondary | ICD-10-CM

## 2021-11-07 DIAGNOSIS — F411 Generalized anxiety disorder: Secondary | ICD-10-CM

## 2021-11-07 DIAGNOSIS — F4310 Post-traumatic stress disorder, unspecified: Secondary | ICD-10-CM

## 2021-11-08 ENCOUNTER — Other Ambulatory Visit: Payer: Self-pay

## 2021-11-08 ENCOUNTER — Ambulatory Visit (HOSPITAL_COMMUNITY): Payer: No Payment, Other | Admitting: Student in an Organized Health Care Education/Training Program

## 2021-11-08 ENCOUNTER — Encounter: Payer: Self-pay | Admitting: Hematology and Oncology

## 2021-11-08 MED ORDER — SERTRALINE HCL 25 MG PO TABS
25.0000 mg | ORAL_TABLET | Freq: Every day | ORAL | 0 refills | Status: DC
  Filled 2021-11-08: qty 30, 30d supply, fill #0

## 2021-11-08 NOTE — Telephone Encounter (Signed)
Received request the patient needed refill of her Zoloft.  This was sent.   Sent: Zoloft 25 mg daily.  30 tablets with 0 refills.    Fatima Sanger MD Resident

## 2021-11-09 ENCOUNTER — Other Ambulatory Visit: Payer: Self-pay

## 2021-11-09 ENCOUNTER — Encounter: Payer: Self-pay | Admitting: Hematology and Oncology

## 2021-11-10 ENCOUNTER — Other Ambulatory Visit: Payer: Self-pay

## 2021-11-11 ENCOUNTER — Ambulatory Visit (INDEPENDENT_AMBULATORY_CARE_PROVIDER_SITE_OTHER): Payer: Self-pay | Admitting: Pharmacist

## 2021-11-11 ENCOUNTER — Encounter: Payer: Self-pay | Admitting: Pharmacist

## 2021-11-11 VITALS — BP 147/82 | HR 71 | Resp 99

## 2021-11-11 DIAGNOSIS — I10 Essential (primary) hypertension: Secondary | ICD-10-CM

## 2021-11-11 NOTE — Patient Instructions (Addendum)
It was good seeing you again  We would like to have your blood pressure less than 130/80  We will increase your hydralazine to '50mg'$  (2 tablets) in the morning, '25mg'$  in the afternoon, and '50mg'$  (2 tablets) in the evening  Continue your: Amlodipine '10mg'$  daily Bisoprolol '10mg'$  daily Hydrochlorothiazide '25mg'$  daily Olmesartan '40mg'$  daily Furosemide '20mg'$  daily  Continue your exercise routine in the park  Try to start taking your blood pressure once you find your machine    Karren Cobble, PharmD, [place], [place], [place], [place], [place], [address] Phone: [phone], Fax: [phone]

## 2021-11-11 NOTE — Progress Notes (Signed)
Patient ID: Crystal Vasquez                 DOB: 1970-04-17                      MRN: [id number]     HPI: Crystal Vasquez is a 51 y.o. female referred by Dr. Harl Bowie to HTN clinic. PMH is significant for HTN, T2DM, and Graves Disease.  Patient's situation is currently complicated by homelessness. Has been living in her car and in and out of different friend's house but she does not feel safe.  Typically is showering at [employer] and exercising by walking in the park.  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   Seen by behavioral health and diagnosed with PTSD.  Started on sertraline and quetiapine..  At last visit, patient was started on hydralazine Has been receiving medications from [facility].  Has a BP cuff but has not been using frequently.  No adverse effects to hydralazine but reports she will often forget midday dose.  Is able to receive some of her medications from the health department.  Current HTN meds:  Amlodipine '10mg'$  daily Bisoprolol '10mg'$  daily HCTZ '25mg'$  daily Olmesartan '40mg'$  daily Furosemide '20mg'$  daily  BP goal: <130/80   Wt Readings from Last 3 Encounters:  10/31/21 176 lb 6.4 oz (80 kg)  10/27/21 175 lb 12.8 oz (79.7 kg)  10/12/21 177 lb 6.4 oz (80.5 kg)   BP Readings from Last 3 Encounters:  11/11/21 (!) 147/82  10/31/21 (!) 136/90  10/27/21 (!) 175/89   Pulse Readings from Last 3 Encounters:  11/11/21 71  10/31/21 79  10/27/21 65    Renal function: Estimated Creatinine Clearance: 88.8 mL/min (by C-G formula based on SCr of 0.72 mg/dL).  Past Medical History:  Diagnosis Date   Anemia    Asthma    due to acid reflux   Depression    DM2    Dyspnea    GERD (gastroesophageal reflux disease)    Headache    Heart murmur    benign   Hypertension    Obesity (BMI 30-39.9)    Thyroid disease     Current Outpatient Medications on File Prior to Visit  Medication Sig Dispense Refill    ACETAMINOPHEN EXTRA STRENGTH 500 MG capsule SMARTSIG:2 Capsule(s) By Mouth Daily PRN     amLODipine (NORVASC) 10 MG tablet Take 1 tablet (10 mg total) by mouth daily. 90 tablet 0   bisoprolol (ZEBETA) 10 MG tablet Take 1 tablet (10 mg total) by mouth daily. 90 tablet 3   ferrous sulfate 325 (65 FE) MG tablet Take 325 mg by mouth in the morning.     fluticasone (VERAMYST) 27.5 MCG/SPRAY nasal spray Place 1 spray into the nose in the morning.     glipiZIDE (GLUCOTROL) 10 MG tablet Take 1 tablet (10 mg total) by mouth 2 (two) times daily before a meal. 180 tablet 0   hydrALAZINE (APRESOLINE) 25 MG tablet Take 1 tablet (25 mg total) by mouth 3 (three) times daily. 90 tablet 2   hydrochlorothiazide (HYDRODIURIL) 25 MG tablet Take 1 tablet (25 mg total) by mouth daily. 90 tablet 0   hydrocortisone 1 % ointment Apply 1 Application topically 2 (two) times daily. 30 g 0   LASIX 20 MG tablet Take 20 mg by mouth daily as needed.     meloxicam (MOBIC) 15 MG tablet  Take 15 mg by mouth daily as needed.     metFORMIN (GLUCOPHAGE) 1000 MG tablet Take 1 tablet (1,000 mg total) by mouth 2 (two) times daily with a meal. 180 tablet 0   olmesartan (BENICAR) 40 MG tablet Take 1 tablet (40 mg total) by mouth daily. 90 tablet 3   omeprazole (PRILOSEC) 20 MG capsule Take 20 mg by mouth daily.     QUEtiapine (SEROQUEL) 50 MG tablet Take 1 tablet (50 mg total) by mouth at bedtime. 30 tablet 1   sertraline (ZOLOFT) 25 MG tablet Take 1 tablet (25 mg total) by mouth daily. 30 tablet 0   simvastatin (ZOCOR) 20 MG tablet Take 20 mg by mouth at bedtime.     No current facility-administered medications on file prior to visit.    No Known Allergies   Assessment/Plan:  1. Hypertension -   HYPERTENSION CONTROL Vitals:   11/11/21 1454 11/11/21 1504  BP: (!) 142/87 (!) 147/82    The patient's blood pressure is elevated above target today.  In order to address the patient's elevated BP: A current anti-hypertensive  medication was adjusted today.     Patient BP in room 147/82 which is above goal of <130/80 despite multiple antihypertensive medications. Stress likely still contributing.  Since she is tolerating hydralazine, will increase morning and evening dose to '50mg'$ . Keep midday dose at '25mg'$  since she often forgets it.  Recheck in 4 weeks.   Continue: Amlodipine '10mg'$  daily Bisoprolol '10mg'$  daily HCTZ '25mg'$  daily Olmesartan '40mg'$  daily Furosemide '20mg'$  daily Start hydralazine '50mg'$  in morning and evening, '25mg'$  midday  Karren Cobble, PharmD, [place], [place], [place], [place], [place], [address] Phone: [phone], Fax: [phone]

## 2021-11-13 NOTE — Progress Notes (Signed)
Reviewed and agree with assessment/plan.   Jahad Old, MD  Pulmonary/Critical Care 11/13/2021, 4:14 PM Pager:  [phone]  

## 2021-11-22 ENCOUNTER — Other Ambulatory Visit: Payer: Self-pay

## 2021-11-22 ENCOUNTER — Encounter: Payer: Self-pay | Admitting: Hematology and Oncology

## 2021-11-22 ENCOUNTER — Encounter (HOSPITAL_COMMUNITY): Payer: Self-pay | Admitting: Student in an Organized Health Care Education/Training Program

## 2021-11-22 ENCOUNTER — Ambulatory Visit (INDEPENDENT_AMBULATORY_CARE_PROVIDER_SITE_OTHER): Payer: No Payment, Other | Admitting: Student in an Organized Health Care Education/Training Program

## 2021-11-22 ENCOUNTER — Ambulatory Visit (HOSPITAL_COMMUNITY): Payer: No Payment, Other | Admitting: Student in an Organized Health Care Education/Training Program

## 2021-11-22 DIAGNOSIS — F411 Generalized anxiety disorder: Secondary | ICD-10-CM | POA: Diagnosis not present

## 2021-11-22 DIAGNOSIS — F4310 Post-traumatic stress disorder, unspecified: Secondary | ICD-10-CM

## 2021-11-22 DIAGNOSIS — F331 Major depressive disorder, recurrent, moderate: Secondary | ICD-10-CM

## 2021-11-22 MED ORDER — SERTRALINE HCL 50 MG PO TABS
50.0000 mg | ORAL_TABLET | Freq: Every day | ORAL | 1 refills | Status: DC
  Filled 2021-11-22: qty 30, 30d supply, fill #0
  Filled 2021-12-18 – 2021-12-19 (×2): qty 30, 30d supply, fill #1

## 2021-11-22 MED ORDER — QUETIAPINE FUMARATE 50 MG PO TABS
50.0000 mg | ORAL_TABLET | Freq: Every day | ORAL | 1 refills | Status: DC
  Filled 2021-11-22 – 2021-12-19 (×3): qty 30, 30d supply, fill #0

## 2021-11-22 NOTE — Progress Notes (Signed)
[facility] MD/PA/NP OP Progress Note  11/22/2021 3:01 PM Crystal Vasquez  MRN:  [id number]  Chief Complaint:  Chief Complaint  Patient presents with   Follow-up   Depression   Anxiety   HPI:  Crystal Vasquez is a 51 yr old female who presents for follow-up and medication management.  PPHx is significant for Depression, Anxiety, PTSD and possible ADHD, no history of Suicide Attempts, Self Injurious Behavior, or hospitalizations.  She reports that when she started her medications she initially she did notice an improvement.  She reports her mood was better and her sleep had improved.  She reports that she is been under acute stressors for the last 2-1/2 weeks.  She reports she is got into arguments with her friends due to car issues.  She reports that she has come to the realization that she might need to part ways with her friends at least for the time being so that she can better focus on herself.  She reports that her sleep has been poor since the car issues and trouble with her friends started.  She reports she has been using the [facility] guarded parking lot to sleep in a car which she does feel is safe there.  She reports she has been working at [employer] and enjoying it so far.  She reports no side effects from medications.  Discussed with her increasing her Zoloft to better manage her depression and anxiety symptoms.  She was agreeable to this.  She also reports she is currently seeing a therapist here and has been doing better with that.  She reports no SI, HI, or AVH.  She reports her appetite is doing fair.  She reports his sleep has been poor the last 3 weeks.  She will return for follow-up in approximately 4 weeks.    Visit Diagnosis:    ICD-10-CM   1. MDD (major depressive disorder), recurrent episode, moderate (HCC)  F33.1 QUEtiapine (SEROQUEL) 50 MG tablet    sertraline (ZOLOFT) 50 MG tablet    2. Generalized anxiety disorder  F41.1 QUEtiapine (SEROQUEL) 50 MG tablet    sertraline  (ZOLOFT) 50 MG tablet    3. PTSD (post-traumatic stress disorder)  F43.10 QUEtiapine (SEROQUEL) 50 MG tablet    sertraline (ZOLOFT) 50 MG tablet      Past Psychiatric History: Depression, Anxiety, PTSD and possible ADHD, no history of Suicide Attempts, Self Injurious Behavior, or hospitalizations.  Past Medical History:  Past Medical History:  Diagnosis Date   Anemia    Asthma    due to acid reflux   Depression    DM2    Dyspnea    GERD (gastroesophageal reflux disease)    Headache    Heart murmur    benign   Hypertension    Obesity (BMI 30-39.9)    Thyroid disease     Past Surgical History:  Procedure Laterality Date   BIOPSY THYROID     IR ANGIOGRAM PELVIS SELECTIVE OR SUPRASELECTIVE  05/09/2021   IR ANGIOGRAM SELECTIVE EACH ADDITIONAL VESSEL  05/09/2021   IR AORTAGRAM ABDOMINAL SERIALOGRAM  05/09/2021   IR EMBO TUMOR ORGAN ISCHEMIA INFARCT INC GUIDE ROADMAPPING  05/09/2021   IR RADIOLOGIST EVAL & MGMT  04/11/2021   IR US GUIDE VASC ACCESS LEFT  05/09/2021   ROBOTIC ASSISTED LAPAROSCOPIC HYSTERECTOMY AND SALPINGECTOMY Bilateral 05/18/2021   Procedure: XI ROBOTIC ASSISTED LAPAROSCOPIC HYSTERECTOMY AND SALPINGECTOMY, UTERUS MORE THAN 250 GRAMS;  Surgeon: Lafonda Mosses, MD;  Location: [facility];  Service: Gynecology;  Laterality: Bilateral;    Family Psychiatric History: Mother- Bipolar Disorder, Suicide 35 (OD) Sister- Schizophrenia, Suicide 2010 (OD) Brother- Bipolar Disorder, EtOH Abuse, Polysubstance Abuse  Family History:  Family History  Problem Relation Age of Onset   Diabetes Mother    Hypertension Mother    Diabetes Father    Hypertension Father    Endometrial cancer Maternal Aunt    Colon cancer Neg Hx    Breast cancer Neg Hx    Ovarian cancer Neg Hx    Pancreatic cancer Neg Hx    Prostate cancer Neg Hx     Social History:  Social History   Socioeconomic History   Marital status: Single    Spouse name: Not on file   Number of children: Not on file    Years of education: Not on file   Highest education level: Not on file  Occupational History   Not on file  Tobacco Use   Smoking status: Never    Passive exposure: Never   Smokeless tobacco: Never  Vaping Use   Vaping Use: Never used  Substance and Sexual Activity   Alcohol use: Not Currently   Drug use: Never   Sexual activity: Not Currently  Other Topics Concern   Not on file  Social History Narrative   Not on file   Social Determinants of Health   Financial Resource Strain: High Risk (10/12/2021)   Overall Financial Resource Strain (CARDIA)    Difficulty of Paying Living Expenses: Very hard  Food Insecurity: Food Insecurity Present (10/12/2021)   Hunger Vital Sign    Worried About Running Out of Food in the Last Year: Often true    Ran Out of Food in the Last Year: Often true  Transportation Needs: Unmet Transportation Needs (10/12/2021)   PRAPARE - Hydrologist (Medical): Yes    Lack of Transportation (Non-Medical): Yes  Physical Activity: Not on file  Stress: Not on file  Social Connections: Not on file    Allergies: No Known Allergies  Metabolic Disorder Labs: Lab Results  Component Value Date   HGBA1C 8.5 (A) 06/29/2021   No results found for: "PROLACTIN" No results found for: "CHOL", "TRIG", "HDL", "CHOLHDL", "VLDL", "LDLCALC" Lab Results  Component Value Date   TSH 0.134 (L) 06/29/2021   TSH 0.313 (L) 05/06/2021    Therapeutic Level Labs: No results found for: "LITHIUM" No results found for: "VALPROATE" No results found for: "CBMZ"  Current Medications: Current Outpatient Medications  Medication Sig Dispense Refill   ACETAMINOPHEN EXTRA STRENGTH 500 MG capsule SMARTSIG:2 Capsule(s) By Mouth Daily PRN     amLODipine (NORVASC) 10 MG tablet Take 1 tablet (10 mg total) by mouth daily. 90 tablet 0   bisoprolol (ZEBETA) 10 MG tablet Take 1 tablet (10 mg total) by mouth daily. 90 tablet 3   ferrous sulfate 325 (65 FE) MG  tablet Take 325 mg by mouth in the morning.     fluticasone (VERAMYST) 27.5 MCG/SPRAY nasal spray Place 1 spray into the nose in the morning.     glipiZIDE (GLUCOTROL) 10 MG tablet Take 1 tablet (10 mg total) by mouth 2 (two) times daily before a meal. 180 tablet 0   hydrALAZINE (APRESOLINE) 25 MG tablet 2 tablets in morning and evening, 1 tablet in afternoon 90 tablet 2   hydrochlorothiazide (HYDRODIURIL) 25 MG tablet Take 1 tablet (25 mg total) by mouth daily. 90 tablet 0   hydrocortisone 1 % ointment Apply 1 Application topically 2 (  two) times daily. 30 g 0   LASIX 20 MG tablet Take 20 mg by mouth daily as needed.     meloxicam (MOBIC) 15 MG tablet Take 15 mg by mouth daily as needed.     metFORMIN (GLUCOPHAGE) 1000 MG tablet Take 1 tablet (1,000 mg total) by mouth 2 (two) times daily with a meal. 180 tablet 0   olmesartan (BENICAR) 40 MG tablet Take 1 tablet (40 mg total) by mouth daily. 90 tablet 3   omeprazole (PRILOSEC) 20 MG capsule Take 20 mg by mouth daily.     simvastatin (ZOCOR) 20 MG tablet Take 20 mg by mouth at bedtime.     QUEtiapine (SEROQUEL) 50 MG tablet Take 1 tablet (50 mg total) by mouth at bedtime. 30 tablet 1   sertraline (ZOLOFT) 50 MG tablet Take 1 tablet (50 mg total) by mouth daily. 30 tablet 1   No current facility-administered medications for this visit.     Musculoskeletal: Strength & Muscle Tone: within normal limits Gait & Station: normal Patient leans: N/A  Psychiatric Specialty Exam: Review of Systems  Respiratory:  Negative for shortness of breath.   Cardiovascular:  Negative for chest pain.  Gastrointestinal:  Negative for abdominal pain, constipation, diarrhea, nausea and vomiting.  Neurological:  Negative for dizziness, weakness and headaches.  Psychiatric/Behavioral:  Positive for dysphoric mood and sleep disturbance. Negative for agitation, hallucinations, self-injury and suicidal ideas. The patient is nervous/anxious.     Last menstrual period  04/27/2021.There is no height or weight on file to calculate BMI.  General Appearance: Casual and Fairly Groomed  Eye Contact:  Good  Speech:  Clear and Coherent and Normal Rate  Volume:  Normal  Mood:  Dysphoric  Affect:  Congruent  Thought Process:  Coherent and Goal Directed  Orientation:  Full (Time, Place, and Person)  Thought Content: Logical   Suicidal Thoughts:  No  Homicidal Thoughts:  No  Memory:  Immediate;   Good Recent;   Good  Judgement:  Fair  Insight:  Fair  Psychomotor Activity:  Normal  Concentration:  Concentration: Good and Attention Span: Good  Recall:  Good  Fund of Knowledge: Good  Language: Good  Akathisia:  Negative  Handed:  Right  AIMS (if indicated): done AIMS= 0  Assets:  Desire for Improvement Resilience Vocational/Educational  ADL's:  Intact  Cognition: WNL  Sleep:  Poor   Screenings: GAD-7    Flowsheet Row Counselor from 07/06/2021 in [facility] Office Visit from 11/11/2020 in [place] for [place] at [facility]  Total GAD-7 Score 14 15      PHQ2-9    [place] Visit from 06/29/2021 in [place] at [facility] Visit from 02/17/2021 in [place] at [facility] Visit from 11/11/2020 in [place] for [place] at [facility]  PHQ-2 Total Score '2 2 2  '$ PHQ-9 Total Score '4 11 10      '$ Flowsheet Row Counselor from 07/06/2021 in [facility] ED to Hosp-Admission (Discharged) from 05/19/2021 in [facility] Admission (Discharged) from 05/18/2021 in [place] No Risk No Risk No Risk        Assessment and Plan:  Crystal Vasquez is a 51 yr old female who presents for follow-up and medication management.  PPHx is significant for Depression, Anxiety, PTSD and possible ADHD, no history of Suicide Attempts, Self Injurious Behavior,  or  hospitalizations.   Crystal Vasquez has had some improvement in her symptoms with starting the medications.  She is currently under acute stressors of her sleep has not been as good lately.  We will increase her Zoloft to better control her depression and anxiety.  We will not make any other medication changes at this time.  She will return for follow-up in approximately 4 weeks.    MDD, Recurrent, Moderate  GAD  PTSD: -Increase Zoloft to 50 mg daily for depression and anxiety. 30 tablets with 1 refill -Continue Seroquel 50 mg QHS for augmentation, mood stability, and insomnia.  30 tablets with 1 refill.   Collaboration of Care:   Patient/Guardian was advised Release of Information must be obtained prior to any record release in order to collaborate their care with an outside provider. Patient/Guardian was advised if they have not already done so to contact the registration department to sign all necessary forms in order for [place] to release information regarding their care.   Consent: Patient/Guardian gives verbal consent for treatment and assignment of benefits for services provided during this visit. Patient/Guardian expressed understanding and agreed to proceed.    Briant Cedar, MD 11/22/2021, 3:01 PM

## 2021-11-25 ENCOUNTER — Other Ambulatory Visit: Payer: Self-pay

## 2021-11-30 ENCOUNTER — Other Ambulatory Visit: Payer: Self-pay

## 2021-11-30 ENCOUNTER — Encounter: Payer: Self-pay | Admitting: Hematology and Oncology

## 2021-11-30 ENCOUNTER — Other Ambulatory Visit (HOSPITAL_COMMUNITY): Payer: Self-pay

## 2021-11-30 MED ORDER — OLMESARTAN MEDOXOMIL 40 MG PO TABS
40.0000 mg | ORAL_TABLET | Freq: Every day | ORAL | 0 refills | Status: DC
  Filled 2021-11-30 (×2): qty 30, 30d supply, fill #0

## 2021-11-30 MED ORDER — BISOPROLOL FUMARATE 10 MG PO TABS
10.0000 mg | ORAL_TABLET | Freq: Every day | ORAL | 0 refills | Status: DC
  Filled 2021-11-30 (×3): qty 30, 30d supply, fill #0

## 2021-12-02 ENCOUNTER — Other Ambulatory Visit: Payer: Self-pay

## 2021-12-09 ENCOUNTER — Encounter: Payer: Self-pay | Admitting: Pharmacist

## 2021-12-09 ENCOUNTER — Ambulatory Visit: Payer: Self-pay | Attending: Cardiology | Admitting: Pharmacist

## 2021-12-09 VITALS — BP 134/82 | HR 56 | Wt 177.4 lb

## 2021-12-09 DIAGNOSIS — I10 Essential (primary) hypertension: Secondary | ICD-10-CM

## 2021-12-09 NOTE — Patient Instructions (Addendum)
It was good seeing you again  Your blood pressure is much improved but still slightly above goal of <130/80  We do not need to make any changes today  Please continue: Amlodipine '10mg'$  daily Bisoprolol '10mg'$  daily Hydrochlorothiazide '25mg'$  daily Olmesartan '40mg'$  daily Furosemide '20mg'$  daily Hydralazine '50mg'$  in the morning and evening and '25mg'$  in the afternoon  Karren Cobble, PharmD, [place], [place], [place], [place], [place], [address] Phone: [phone], Fax: [phone]

## 2021-12-09 NOTE — Progress Notes (Signed)
Patient ID: Crystal Vasquez                 DOB: [birth year]                      MRN: [id number]      HPI: Crystal Vasquez is a 51 y.o. female referred by Dr. Harl Bowie to HTN clinic. PMH is significant for HTN, T2DM, and Graves Disease.  Patient presents today for third follow up with HTN clinic. At last visit, hydralazine was added to regimen. Patient's situation continues to be impacted by living in her car. Typically is showering at [employer] and exercising by walking in the park.  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 Has kept her busy and is helping with stress levels. Psych also recently increased sertraline.  Has not been checking her BP since she does not know where her cuff is in her car.  Often forgets to take midday dose of hydralazine but now it is because she is working. Due to frequent travel and work is not as able to eat as healthy. 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  Current HTN meds:  Amlodipine '10mg'$  daily Bisoprolol '10mg'$  daily HCTZ '25mg'$  daily Olmesartan '40mg'$  daily Furosemide '20mg'$  daily Hydralazine '50mg'$  morning and evening, '25mg'$  in afternoon  BP goal: <130/80   Wt Readings from Last 3 Encounters:  10/31/21 176 lb 6.4 oz (80 kg)  10/27/21 175 lb 12.8 oz (79.7 kg)  10/12/21 177 lb 6.4 oz (80.5 kg)   BP Readings from Last 3 Encounters:  11/11/21 (!) 147/82  10/31/21 (!) 136/90  10/27/21 (!) 175/89   Pulse Readings from Last 3 Encounters:  11/11/21 71  10/31/21 79  10/27/21 65    Renal function: Estimated Creatinine Clearance: 88.8 mL/min (by C-G formula based on SCr of 0.72 mg/dL).  Past Medical History:  Diagnosis Date   Anemia    Asthma    due to acid reflux   Depression    DM2    Dyspnea    GERD (gastroesophageal reflux disease)    Headache    Heart murmur    benign   Hypertension    Obesity (BMI 30-39.9)    Thyroid disease      Current Outpatient Medications on File Prior to Visit  Medication Sig Dispense Refill   ACETAMINOPHEN EXTRA STRENGTH 500 MG capsule SMARTSIG:2 Capsule(s) By Mouth Daily PRN     amLODipine (NORVASC) 10 MG tablet Take 1 tablet (10 mg total) by mouth daily. 90 tablet 0   bisoprolol (ZEBETA) 10 MG tablet Take 1 tablet (10 mg total) by mouth daily. 90 tablet 3   ferrous sulfate 325 (65 FE) MG tablet Take 325 mg by mouth in the morning.     fluticasone (VERAMYST) 27.5 MCG/SPRAY nasal spray Place 1 spray into the nose in the morning.     glipiZIDE (GLUCOTROL) 10 MG tablet Take 1 tablet (10 mg total) by mouth 2 (two) times daily before a meal. 180 tablet 0   hydrALAZINE (APRESOLINE) 25 MG tablet Take 1 tablet (25 mg total) by mouth 3 (three) times daily. 90 tablet 2   hydrochlorothiazide (HYDRODIURIL) 25 MG tablet Take 1 tablet (25 mg total) by mouth daily. 90 tablet 0   hydrocortisone 1 % ointment Apply 1 Application topically 2 (two) times daily. 30 g 0   LASIX 20 MG tablet  Take 20 mg by mouth daily as needed.     meloxicam (MOBIC) 15 MG tablet Take 15 mg by mouth daily as needed.     metFORMIN (GLUCOPHAGE) 1000 MG tablet Take 1 tablet (1,000 mg total) by mouth 2 (two) times daily with a meal. 180 tablet 0   olmesartan (BENICAR) 40 MG tablet Take 1 tablet (40 mg total) by mouth daily. 90 tablet 3   omeprazole (PRILOSEC) 20 MG capsule Take 20 mg by mouth daily.     QUEtiapine (SEROQUEL) 50 MG tablet Take 1 tablet (50 mg total) by mouth at bedtime. 30 tablet 1   sertraline (ZOLOFT) 25 MG tablet Take 1 tablet (25 mg total) by mouth daily. 30 tablet 0   simvastatin (ZOCOR) 20 MG tablet Take 20 mg by mouth at bedtime.     No current facility-administered medications on file prior to visit.    No Known Allergies   Assessment/Plan:  1. Hypertension -    Patient BP today much improved at 134/82 although still slightly above goal. Increase in medications and decrease in stress are both likely  contributory. Predict with continued working and haing established paycheck BP will reach goal. Advised to try to make healthier food choices. Also recommended she use BP cuff at [employer] stores to monitor BP. Patient voiced understanding. Will not change any meds at this time and recheck in one month.  Amlodipine '10mg'$  daily Bisoprolol '10mg'$  daily Hydrochlorothiazide '25mg'$  daily Olmesartan '40mg'$  daily Furosemide '20mg'$  daily Hydralazine '50mg'$  in the morning and evening and '25mg'$  in the afternoon Recheck in 1 month  Karren Cobble, PharmD, [place], [place], [place], [place], [place], [address] Phone: [phone], Fax: [phone]

## 2021-12-19 ENCOUNTER — Encounter: Payer: Self-pay | Admitting: Hematology and Oncology

## 2021-12-19 ENCOUNTER — Ambulatory Visit (INDEPENDENT_AMBULATORY_CARE_PROVIDER_SITE_OTHER): Payer: No Payment, Other | Admitting: Clinical

## 2021-12-19 ENCOUNTER — Other Ambulatory Visit: Payer: Self-pay

## 2021-12-19 DIAGNOSIS — F4310 Post-traumatic stress disorder, unspecified: Secondary | ICD-10-CM | POA: Diagnosis not present

## 2021-12-19 NOTE — Progress Notes (Signed)
THERAPIST PROGRESS NOTE  Session Time: 45 minutes  Participation Level: Active  Behavioral Response: CasualAlertAnxious  Type of Therapy: Individual Therapy  Treatment Goals addressed: client will practice problem solving skills 3 times per week for the next 4 weeks   ProgressTowards Goals: Progressing  Interventions: CBT and Supportive  Summary:  Crystal Vasquez is a 51 y.o. female who presents for the scheduled appointment oriented x5, appropriately dressed, and friendly.  Client denied hallucinations and delusions. Client reported on today she is doing fairly well. Client reported she recently ended a friendship with one of her best friend.  Client reported during a time when her car was not operational she asked this friend to potentially help her temporarily until she started her job at [employer].  Client reported that overall this friend never listens to details that she gives her about her plans to work towards her goal to save money and start working.  Client reported her job at [employer] has been going well but she has times when she shuts down. Client detailed a few instances with lead position persons who made her feel belittled for needing to be corrected on things that she did not do correctly.  Client reported she is fine with taking constructive criticism but there is a more appropriate way to do so.  Client reported one of the issues with the job is that she has not received proper training and others are aware of that. Client reported they noticed her good qualities such as being eager to learn. Client reported she has a mindset of not wanting to get comfortable in a place because when that happens people don't leave. Client reported she never liked the idea of having a home because with living in her car she could always up and leave when she felt like she needed to. Client reported improved concentration with the psych medications. Client reported she is unsure if she wants to  continue with therapy. Evidence of progress towards goal:  client reported she is medication compliant 7 days out of the week. Client was able to identify 2 triggers related to PTSD.   Suicidal/Homicidal: Nowithout intent/plan  Therapist Response:  Therapist began the appointment asking the client how she has been doing since last seen. Therapist used CBT to engage using active listening and positive emotional support. Therapist used CBT to engage and ask the client to discuss changes in her social, home, and occupational settings. Therapist used CBT to normalize the clients thoughts and emotions to triggering situations. Therapist used CBT to discuss identifying triggers, grounding techniques and positive communication skills for triggering situations. Therapist used CBT ask the client to identify her progress with frequency of use with coping skills with continued practice in her daily activity.    Therapist assigned the client homework to practice the phrases to use during triggering conversations and self care.   Plan: Return again in 3 weeks.  Diagnosis: PTSD  Collaboration of Care: Patient refused AEB no other needs requested by the client at this time.  Patient/Guardian was advised Release of Information must be obtained prior to any record release in order to collaborate their care with an outside provider. Patient/Guardian was advised if they have not already done so to contact the registration department to sign all necessary forms in order for [place] to release information regarding their care.   Consent: Patient/Guardian gives verbal consent for treatment and assignment of benefits for services provided during this visit. Patient/Guardian expressed understanding and agreed to  proceed.   [place], LCSW 12/19/2021

## 2021-12-20 ENCOUNTER — Ambulatory Visit (INDEPENDENT_AMBULATORY_CARE_PROVIDER_SITE_OTHER): Payer: No Payment, Other | Admitting: Student in an Organized Health Care Education/Training Program

## 2021-12-20 ENCOUNTER — Encounter: Payer: Self-pay | Admitting: Hematology and Oncology

## 2021-12-20 ENCOUNTER — Other Ambulatory Visit: Payer: Self-pay

## 2021-12-20 DIAGNOSIS — F4310 Post-traumatic stress disorder, unspecified: Secondary | ICD-10-CM

## 2021-12-20 DIAGNOSIS — F411 Generalized anxiety disorder: Secondary | ICD-10-CM

## 2021-12-20 DIAGNOSIS — F331 Major depressive disorder, recurrent, moderate: Secondary | ICD-10-CM | POA: Diagnosis not present

## 2021-12-20 MED ORDER — SERTRALINE HCL 100 MG PO TABS
100.0000 mg | ORAL_TABLET | Freq: Every day | ORAL | 1 refills | Status: DC
  Filled 2021-12-20: qty 30, 30d supply, fill #0

## 2021-12-20 MED ORDER — QUETIAPINE FUMARATE 50 MG PO TABS: 50.0000 mg | ORAL_TABLET | Freq: Every day | ORAL | 1 refills | Status: DC

## 2021-12-20 NOTE — Plan of Care (Signed)
  Problem: Anxiety Disorder CCP Problem  1  Goal: LTG: Patient will score less than 5 on the Generalized Anxiety Disorder 7 Scale (GAD-7) Outcome: Progressing Goal: STG: Patient will practice problem solving skills 3 times per week for the next 4 weeks Outcome: Progressing   

## 2021-12-20 NOTE — Progress Notes (Signed)
[place] MD/PA/NP OP Progress Note  12/20/2021 8:55 AM Crystal Vasquez  MRN:  [id number]  Chief Complaint:  Chief Complaint  Patient presents with   Follow-up   Depression   Anxiety   HPI:  Crystal Vasquez is a 51 yr old female who presents for follow-up and medication management.  PPHx is significant for Depression, Anxiety, PTSD and possible ADHD, no history of Suicide Attempts, Self Injurious Behavior, or hospitalizations.   She reports that she has had improvement since starting her medications.  She reports that she has recently had some interactions at work where instead of getting upset and yelling she was able to not say things that would make the situation worse and instead remove herself from that situation.  She reports her mood has had improvement overall.  She reports some issue with how she approaches problems.  She reports that she always focuses on where she wants to be in the future and often misses the steps to that point.  Discussed with her that one technique that can be helpful is when faced with a large problem breaking it down into smaller steps which are more easy to identify and complete.  She reports that she has had some trouble with swallowing recently.  She reports that she has been seeing the provider at the [facility] but does want to establish with a PCP.  Discussed we would provide her with a list of PCP's because this needs to be further worked up.  She reports she also has an endocrinologist appointment in December.  She reports no SI, HI, or AVH.  She reports no issues with her medications.  She reports her sleep is fair.  She reports her appetite is doing good.  She reports no other concerns at present.   Visit Diagnosis:    ICD-10-CM   1. Generalized anxiety disorder  F41.1 sertraline (ZOLOFT) 100 MG tablet    QUEtiapine (SEROQUEL) 50 MG tablet    2. MDD (major depressive disorder), recurrent episode, moderate (HCC)  F33.1 sertraline (ZOLOFT) 100 MG tablet     QUEtiapine (SEROQUEL) 50 MG tablet    3. PTSD (post-traumatic stress disorder)  F43.10 sertraline (ZOLOFT) 100 MG tablet    QUEtiapine (SEROQUEL) 50 MG tablet      Past Psychiatric History: Depression, Anxiety, PTSD and possible ADHD, no history of Suicide Attempts, Self Injurious Behavior, or hospitalizations.  Past Medical History:  Past Medical History:  Diagnosis Date   Anemia    Asthma    due to acid reflux   Depression    DM2    Dyspnea    GERD (gastroesophageal reflux disease)    Headache    Heart murmur    benign   Hypertension    Obesity (BMI 30-39.9)    Thyroid disease     Past Surgical History:  Procedure Laterality Date   BIOPSY THYROID     IR ANGIOGRAM PELVIS SELECTIVE OR SUPRASELECTIVE  05/09/2021   IR ANGIOGRAM SELECTIVE EACH ADDITIONAL VESSEL  05/09/2021   IR AORTAGRAM ABDOMINAL SERIALOGRAM  05/09/2021   IR EMBO TUMOR ORGAN ISCHEMIA INFARCT INC GUIDE ROADMAPPING  05/09/2021   IR RADIOLOGIST EVAL & MGMT  04/11/2021   IR US GUIDE VASC ACCESS LEFT  05/09/2021   ROBOTIC ASSISTED LAPAROSCOPIC HYSTERECTOMY AND SALPINGECTOMY Bilateral 05/18/2021   Procedure: XI ROBOTIC ASSISTED LAPAROSCOPIC HYSTERECTOMY AND SALPINGECTOMY, UTERUS MORE THAN 250 GRAMS;  Surgeon: Lafonda Mosses, MD;  Location: [facility];  Service: Gynecology;  Laterality: Bilateral;    Family  Psychiatric History: Mother- Bipolar Disorder, Suicide 86 (OD) Sister- Schizophrenia, Suicide 2010 (OD) Brother- Bipolar Disorder, EtOH Abuse, Polysubstance Abuse  Family History:  Family History  Problem Relation Age of Onset   Diabetes Mother    Hypertension Mother    Diabetes Father    Hypertension Father    Endometrial cancer Maternal Aunt    Colon cancer Neg Hx    Breast cancer Neg Hx    Ovarian cancer Neg Hx    Pancreatic cancer Neg Hx    Prostate cancer Neg Hx     Social History:  Social History   Socioeconomic History   Marital status: Single    Spouse name: Not on file   Number of  children: Not on file   Years of education: Not on file   Highest education level: Not on file  Occupational History   Not on file  Tobacco Use   Smoking status: Never    Passive exposure: Never   Smokeless tobacco: Never  Vaping Use   Vaping Use: Never used  Substance and Sexual Activity   Alcohol use: Not Currently   Drug use: Never   Sexual activity: Not Currently  Other Topics Concern   Not on file  Social History Narrative   Not on file   Social Determinants of Health   Financial Resource Strain: High Risk (10/12/2021)   Overall Financial Resource Strain (CARDIA)    Difficulty of Paying Living Expenses: Very hard  Food Insecurity: Food Insecurity Present (10/12/2021)   Hunger Vital Sign    Worried About Running Out of Food in the Last Year: Often true    Ran Out of Food in the Last Year: Often true  Transportation Needs: Unmet Transportation Needs (10/12/2021)   PRAPARE - Hydrologist (Medical): Yes    Lack of Transportation (Non-Medical): Yes  Physical Activity: Not on file  Stress: Not on file  Social Connections: Not on file    Allergies: No Known Allergies  Metabolic Disorder Labs: Lab Results  Component Value Date   HGBA1C 8.5 (A) 06/29/2021   No results found for: "PROLACTIN" No results found for: "CHOL", "TRIG", "HDL", "CHOLHDL", "VLDL", "LDLCALC" Lab Results  Component Value Date   TSH 0.134 (L) 06/29/2021   TSH 0.313 (L) 05/06/2021    Therapeutic Level Labs: No results found for: "LITHIUM" No results found for: "VALPROATE" No results found for: "CBMZ"  Current Medications: Current Outpatient Medications  Medication Sig Dispense Refill   ACETAMINOPHEN EXTRA STRENGTH 500 MG capsule SMARTSIG:2 Capsule(s) By Mouth Daily PRN     amLODipine (NORVASC) 10 MG tablet Take 1 tablet (10 mg total) by mouth daily. 90 tablet 0   bisoprolol (ZEBETA) 10 MG tablet Take 1 tablet (10 mg total) by mouth daily. 90 tablet 3   bisoprolol  (ZEBETA) 10 MG tablet Take 1 tablet by mouth daily 30 tablet 0   ferrous sulfate 325 (65 FE) MG tablet Take 325 mg by mouth in the morning.     fluticasone (VERAMYST) 27.5 MCG/SPRAY nasal spray Place 1 spray into the nose in the morning.     glipiZIDE (GLUCOTROL) 10 MG tablet Take 1 tablet (10 mg total) by mouth 2 (two) times daily before a meal. 180 tablet 0   hydrALAZINE (APRESOLINE) 25 MG tablet 2 tablets in morning and evening, 1 tablet in afternoon 90 tablet 2   hydrochlorothiazide (HYDRODIURIL) 25 MG tablet Take 1 tablet (25 mg total) by mouth daily. 90 tablet 0  hydrocortisone 1 % ointment Apply 1 Application topically 2 (two) times daily. 30 g 0   LASIX 20 MG tablet Take 20 mg by mouth daily as needed.     meloxicam (MOBIC) 15 MG tablet Take 15 mg by mouth daily as needed.     metFORMIN (GLUCOPHAGE) 1000 MG tablet Take 1 tablet (1,000 mg total) by mouth 2 (two) times daily with a meal. 180 tablet 0   olmesartan (BENICAR) 40 MG tablet Take 1 tablet (40 mg total) by mouth daily. 90 tablet 3   olmesartan (BENICAR) 40 MG tablet Take 1 tablet (40 mg total) by mouth daily. 30 tablet 0   omeprazole (PRILOSEC) 20 MG capsule Take 20 mg by mouth daily.     QUEtiapine (SEROQUEL) 50 MG tablet Take 1 tablet (50 mg total) by mouth at bedtime. 30 tablet 1   sertraline (ZOLOFT) 100 MG tablet Take 1 tablet (100 mg total) by mouth daily. 30 tablet 1   simvastatin (ZOCOR) 20 MG tablet Take 20 mg by mouth at bedtime.     No current facility-administered medications for this visit.     Musculoskeletal: Strength & Muscle Tone: within normal limits Gait & Station: normal Patient leans: N/A  Psychiatric Specialty Exam: Review of Systems  Respiratory:  Negative for shortness of breath.   Cardiovascular:  Negative for chest pain.  Gastrointestinal:  Negative for abdominal pain, constipation, diarrhea, nausea and vomiting.  Neurological:  Negative for dizziness, weakness and headaches.   Psychiatric/Behavioral:  Negative for dysphoric mood, hallucinations, self-injury, sleep disturbance and suicidal ideas. The patient is not nervous/anxious.     Blood pressure 135/79, pulse 60, height '5\' 6"'$  (1.676 m), weight 170 lb 6.4 oz (77.3 kg), last menstrual period 04/27/2021, SpO2 100 %.Body mass index is 27.5 kg/m.  General Appearance: Casual and Fairly Groomed  Eye Contact:  Good  Speech:  Clear and Coherent and Normal Rate  Volume:  Normal  Mood:  Anxious  Affect:  Congruent  Thought Process:  Coherent and Goal Directed  Orientation:  Full (Time, Place, and Person)  Thought Content: WDL and Logical   Suicidal Thoughts:  No  Homicidal Thoughts:  No  Memory:  Immediate;   Good Recent;   Good  Judgement:  Good  Insight:  Good  Psychomotor Activity:  Normal  Concentration:  Concentration: Good and Attention Span: Good  Recall:  Good  Fund of Knowledge: Good  Language: Good  Akathisia:  Negative  Handed:  Right  AIMS (if indicated): done AIMS=0  Assets:  Communication Skills Desire for Improvement Resilience Transportation Vocational/Educational  ADL's:  Intact  Cognition: WNL  Sleep:  Fair   Screenings: GAD-7    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 [facility] Visit from 11/11/2020 in [place] for [place] at [facility]  Total GAD-7 Score 14 15      PHQ2-9    [place] Visit from 06/29/2021 in Primary Care at [place] Visit from 02/17/2021 in [facility] at [facility] Visit from 11/11/2020 in [place] for [place] at [facility]  PHQ-2 Total Score '2 2 2  '$ PHQ-9 Total Score '4 11 10      '$ Flowsheet Row Counselor from 07/06/2021 in [facility] ED to Hosp-Admission (Discharged) from 05/19/2021 in [facility] Admission (Discharged) from 05/18/2021 in [place] No Risk No Risk No Risk  Assessment and Plan:  Crystal Vasquez is a 51 yr old female who presents for follow-up and medication management.  PPHx is significant for Depression, Anxiety, PTSD and possible ADHD, no history of Suicide Attempts, Self Injurious Behavior, or hospitalizations.     Crystal Vasquez has seen improvements with her medications.  She reports being able to better control her emotions during stressful situations and that her mood is overall improved.  She will work on breaking tasks down to better overcome them.  We will increase her Zoloft at this time to better control her depression/anxiety.  She was provided with a list of PCP's so she can further work up her issues with swallowing and rescheduling her sleep study.  She will return for follow up in approximately 4 weeks.   MDD, Recurrent, Moderate  GAD  PTSD: -Increase Zoloft to 100 mg daily for depression and anxiety. 30 tablets with 1 refill -Continue Seroquel 50 mg QHS for augmentation, mood stability, and insomnia.  30 tablets with 1 refill.    Collaboration of Care: Collaboration of Care: Referral or follow-up with counselor/therapist AEB [place] was advised Release of Information must be obtained prior to any record release in order to collaborate their care with an outside provider. Patient/Guardian was advised if they have not already done so to contact the registration department to sign all necessary forms in order for [place] to release information regarding their care.   Consent: Patient/Guardian gives verbal consent for treatment and assignment of benefits for services provided during this visit. Patient/Guardian expressed understanding and agreed to proceed.    Briant Cedar, MD 12/20/2021, 8:55 AM

## 2021-12-21 ENCOUNTER — Encounter: Payer: Self-pay | Admitting: Nurse Practitioner

## 2021-12-29 ENCOUNTER — Telehealth (HOSPITAL_COMMUNITY): Payer: Self-pay | Admitting: *Deleted

## 2021-12-29 NOTE — Telephone Encounter (Signed)
Patient called stated she longer's would like to continue seeing current therapist . Did LVM to inform that once therapist meets with team members to see who is accepting new patient's  & if they would accept as a patient, we will call to schedule

## 2022-01-02 ENCOUNTER — Ambulatory Visit (HOSPITAL_COMMUNITY): Payer: Self-pay | Admitting: Clinical

## 2022-01-04 ENCOUNTER — Other Ambulatory Visit (HOSPITAL_COMMUNITY): Payer: Self-pay

## 2022-01-04 ENCOUNTER — Encounter: Payer: Self-pay | Admitting: Hematology and Oncology

## 2022-01-04 ENCOUNTER — Other Ambulatory Visit: Payer: Self-pay

## 2022-01-04 MED ORDER — BISOPROLOL FUMARATE 10 MG PO TABS
10.0000 mg | ORAL_TABLET | Freq: Every day | ORAL | 0 refills | Status: DC
  Filled 2022-01-04: qty 30, 30d supply, fill #0

## 2022-01-04 MED ORDER — OLMESARTAN MEDOXOMIL 40 MG PO TABS
40.0000 mg | ORAL_TABLET | Freq: Every day | ORAL | 0 refills | Status: DC
  Filled 2022-01-04: qty 30, 30d supply, fill #0

## 2022-01-05 ENCOUNTER — Other Ambulatory Visit: Payer: Self-pay

## 2022-01-11 ENCOUNTER — Ambulatory Visit: Payer: Self-pay

## 2022-01-17 ENCOUNTER — Other Ambulatory Visit: Payer: Self-pay

## 2022-01-17 ENCOUNTER — Encounter: Payer: Self-pay | Admitting: Hematology and Oncology

## 2022-01-17 ENCOUNTER — Ambulatory Visit (INDEPENDENT_AMBULATORY_CARE_PROVIDER_SITE_OTHER): Payer: No Payment, Other | Admitting: Student in an Organized Health Care Education/Training Program

## 2022-01-17 ENCOUNTER — Encounter (HOSPITAL_COMMUNITY): Payer: Self-pay | Admitting: Student in an Organized Health Care Education/Training Program

## 2022-01-17 DIAGNOSIS — F411 Generalized anxiety disorder: Secondary | ICD-10-CM

## 2022-01-17 DIAGNOSIS — F4310 Post-traumatic stress disorder, unspecified: Secondary | ICD-10-CM

## 2022-01-17 DIAGNOSIS — F331 Major depressive disorder, recurrent, moderate: Secondary | ICD-10-CM | POA: Diagnosis not present

## 2022-01-17 MED ORDER — QUETIAPINE FUMARATE 50 MG PO TABS
50.0000 mg | ORAL_TABLET | Freq: Every day | ORAL | 1 refills | Status: DC
  Filled 2022-01-17: qty 30, 30d supply, fill #0
  Filled 2022-02-21: qty 30, 30d supply, fill #1

## 2022-01-17 MED ORDER — SERTRALINE HCL 100 MG PO TABS
100.0000 mg | ORAL_TABLET | Freq: Every day | ORAL | 1 refills | Status: DC
  Filled 2022-01-17: qty 30, 30d supply, fill #0
  Filled 2022-02-21: qty 30, 30d supply, fill #1

## 2022-01-17 NOTE — Progress Notes (Signed)
BH MD/PA/NP OP Progress Note  01/17/2022 9:48 AM Crystal Vasquez  MRN:  [id number]  Chief Complaint:  Chief Complaint  Patient presents with   Establish Care   Depression   Anxiety   HPI:  Crystal Vasquez is a 51 yr old female who presents for follow-up and medication management.  PPHx is significant for Depression, Anxiety, PTSD and possible ADHD, no history of Suicide Attempts, Self Injurious Behavior, or hospitalizations.  She reports that she has been doing better since the increase in Zoloft.  She reports that she no longer "shuts down" when she gets anxious as she used to.  She reports that stress/drama at work does continue to be high but she is navigating it well.  She reports that she still enjoys work and there is talk of her being offered a full-time position.  She reports that she will be establishing with a PCP on November 7.  She reports that she did have some side effects to the increase in Zoloft of some upset stomach for a few days and that her appetite did decrease for a while due to this, however, she reports that this has resolved and she is no longer having any side effects to her medications.  She reports no SI, HI, or AVH.  She reports her sleep is doing good.  She reports her appetite is improving again.  She reports no other concerns at present.  She will return for follow-up approximately 6 weeks.   Visit Diagnosis:    ICD-10-CM   1. Generalized anxiety disorder  F41.1 QUEtiapine (SEROQUEL) 50 MG tablet    sertraline (ZOLOFT) 100 MG tablet    2. MDD (major depressive disorder), recurrent episode, moderate (HCC)  F33.1 QUEtiapine (SEROQUEL) 50 MG tablet    sertraline (ZOLOFT) 100 MG tablet    3. PTSD (post-traumatic stress disorder)  F43.10 QUEtiapine (SEROQUEL) 50 MG tablet    sertraline (ZOLOFT) 100 MG tablet      Past Psychiatric History: Depression, Anxiety, PTSD and possible ADHD, no history of Suicide Attempts, Self Injurious Behavior, or  hospitalizations.  Past Medical History:  Past Medical History:  Diagnosis Date   Anemia    Asthma    due to acid reflux   Depression    DM2    Dyspnea    GERD (gastroesophageal reflux disease)    Headache    Heart murmur    benign   Hypertension    Obesity (BMI 30-39.9)    Thyroid disease     Past Surgical History:  Procedure Laterality Date   BIOPSY THYROID     IR ANGIOGRAM PELVIS SELECTIVE OR SUPRASELECTIVE  05/09/2021   IR ANGIOGRAM SELECTIVE EACH ADDITIONAL VESSEL  05/09/2021   IR AORTAGRAM ABDOMINAL SERIALOGRAM  05/09/2021   IR EMBO TUMOR ORGAN ISCHEMIA INFARCT INC GUIDE ROADMAPPING  05/09/2021   IR RADIOLOGIST EVAL & MGMT  04/11/2021   IR US GUIDE VASC ACCESS LEFT  05/09/2021   ROBOTIC ASSISTED LAPAROSCOPIC HYSTERECTOMY AND SALPINGECTOMY Bilateral 05/18/2021   Procedure: XI ROBOTIC ASSISTED LAPAROSCOPIC HYSTERECTOMY AND SALPINGECTOMY, UTERUS MORE THAN 250 GRAMS;  Surgeon: Lafonda Mosses, MD;  Location: [facility];  Service: Gynecology;  Laterality: Bilateral;    Family Psychiatric History: Mother- Bipolar Disorder, Suicide 45 (OD) Sister- Schizophrenia, Suicide 2010 (OD) Brother- Bipolar Disorder, EtOH Abuse, Polysubstance Abuse  Family History:  Family History  Problem Relation Age of Onset   Diabetes Mother    Hypertension Mother    Diabetes Father    Hypertension Father  Endometrial cancer Maternal Aunt    Colon cancer Neg Hx    Breast cancer Neg Hx    Ovarian cancer Neg Hx    Pancreatic cancer Neg Hx    Prostate cancer Neg Hx     Social History:  Social History   Socioeconomic History   Marital status: Single    Spouse name: Not on file   Number of children: Not on file   Years of education: Not on file   Highest education level: Not on file  Occupational History   Not on file  Tobacco Use   Smoking status: Never    Passive exposure: Never   Smokeless tobacco: Never  Vaping Use   Vaping Use: Never used  Substance and Sexual Activity    Alcohol use: Not Currently   Drug use: Never   Sexual activity: Not Currently  Other Topics Concern   Not on file  Social History Narrative   Not on file   Social Determinants of Health   Financial Resource Strain: High Risk (10/12/2021)   Overall Financial Resource Strain (CARDIA)    Difficulty of Paying Living Expenses: Very hard  Food Insecurity: Food Insecurity Present (10/12/2021)   Hunger Vital Sign    Worried About Running Out of Food in the Last Year: Often true    Ran Out of Food in the Last Year: Often true  Transportation Needs: Unmet Transportation Needs (10/12/2021)   PRAPARE - Hydrologist (Medical): Yes    Lack of Transportation (Non-Medical): Yes  Physical Activity: Not on file  Stress: Not on file  Social Connections: Not on file    Allergies: No Known Allergies  Metabolic Disorder Labs: Lab Results  Component Value Date   HGBA1C 8.5 (A) 06/29/2021   No results found for: "PROLACTIN" No results found for: "CHOL", "TRIG", "HDL", "CHOLHDL", "VLDL", "LDLCALC" Lab Results  Component Value Date   TSH 0.134 (L) 06/29/2021   TSH 0.313 (L) 05/06/2021    Therapeutic Level Labs: No results found for: "LITHIUM" No results found for: "VALPROATE" No results found for: "CBMZ"  Current Medications: Current Outpatient Medications  Medication Sig Dispense Refill   ACETAMINOPHEN EXTRA STRENGTH 500 MG capsule SMARTSIG:2 Capsule(s) By Mouth Daily PRN     amLODipine (NORVASC) 10 MG tablet Take 1 tablet (10 mg total) by mouth daily. 90 tablet 0   bisoprolol (ZEBETA) 10 MG tablet Take 1 tablet (10 mg total) by mouth daily. 90 tablet 3   bisoprolol (ZEBETA) 10 MG tablet Take 1 tablet (10 mg total) by mouth daily. 30 tablet 0   ferrous sulfate 325 (65 FE) MG tablet Take 325 mg by mouth in the morning.     fluticasone (VERAMYST) 27.5 MCG/SPRAY nasal spray Place 1 spray into the nose in the morning.     glipiZIDE (GLUCOTROL) 10 MG tablet Take 1  tablet (10 mg total) by mouth 2 (two) times daily before a meal. 180 tablet 0   hydrALAZINE (APRESOLINE) 25 MG tablet 2 tablets in morning and evening, 1 tablet in afternoon 90 tablet 2   hydrochlorothiazide (HYDRODIURIL) 25 MG tablet Take 1 tablet (25 mg total) by mouth daily. 90 tablet 0   hydrocortisone 1 % ointment Apply 1 Application topically 2 (two) times daily. 30 g 0   LASIX 20 MG tablet Take 20 mg by mouth daily as needed.     meloxicam (MOBIC) 15 MG tablet Take 15 mg by mouth daily as needed.  metFORMIN (GLUCOPHAGE) 1000 MG tablet Take 1 tablet (1,000 mg total) by mouth 2 (two) times daily with a meal. 180 tablet 0   olmesartan (BENICAR) 40 MG tablet Take 1 tablet (40 mg total) by mouth daily. 90 tablet 3   olmesartan (BENICAR) 40 MG tablet Take 1 tablet (40 mg total) by mouth daily. 30 tablet 0   omeprazole (PRILOSEC) 20 MG capsule Take 20 mg by mouth daily.     QUEtiapine (SEROQUEL) 50 MG tablet Take 1 tablet (50 mg total) by mouth at bedtime. 30 tablet 1   sertraline (ZOLOFT) 100 MG tablet Take 1 tablet (100 mg total) by mouth daily. 30 tablet 1   simvastatin (ZOCOR) 20 MG tablet Take 20 mg by mouth at bedtime.     No current facility-administered medications for this visit.     Musculoskeletal: Strength & Muscle Tone: within normal limits Gait & Station: normal Patient leans: N/A  Psychiatric Specialty Exam: Review of Systems  Respiratory:  Negative for shortness of breath.   Cardiovascular:  Negative for chest pain.  Gastrointestinal:  Negative for abdominal pain, constipation, diarrhea, nausea and vomiting.  Neurological:  Negative for dizziness, weakness and headaches.  Psychiatric/Behavioral:  Negative for dysphoric mood, hallucinations, sleep disturbance and suicidal ideas. The patient is not nervous/anxious.     Blood pressure (!) 158/86, pulse 61, height '5\' 6"'$  (1.676 m), weight 174 lb (78.9 kg), last menstrual period 04/27/2021, SpO2 100 %.Body mass index is  28.08 kg/m.  General Appearance: Casual and Fairly Groomed  Eye Contact:  Good  Speech:  Clear and Coherent and Normal Rate  Volume:  Normal  Mood:  Euthymic  Affect:  Appropriate and Congruent  Thought Process:  Coherent and Goal Directed  Orientation:  Full (Time, Place, and Person)  Thought Content: WDL and Logical   Suicidal Thoughts:  No  Homicidal Thoughts:  No  Memory:  Immediate;   Good Recent;   Good  Judgement:  Good  Insight:  Good  Psychomotor Activity:  Normal  Concentration:  Concentration: Good and Attention Span: Good  Recall:  Good  Fund of Knowledge: Good  Language: Good  Akathisia:  Negative  Handed:  Right  AIMS (if indicated): done AIMS=0-  Assets:  Communication Skills Desire for Improvement Resilience Transportation Vocational/Educational  ADL's:  Intact  Cognition: WNL  Sleep:  Good   Screenings: GAD-7    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 [facility] Office Visit from 11/11/2020 in [place] for [place] [facility]  Total GAD-7 Score 14 15      PHQ2-9    [place] Visit from 06/29/2021 in [place] at [place] Visit from 02/17/2021 in [place] at [facility] Visit from 11/11/2020 in [place] for [place] at [facility]  PHQ-2 Total Score '2 2 2  '$ PHQ-9 Total Score '4 11 10      '$ Flowsheet Row Counselor from 07/06/2021 in [facility] ED to Hosp-Admission (Discharged) from 05/19/2021 in [facility] Admission (Discharged) from 05/18/2021 in [place] No Risk No Risk No Risk        Assessment and Plan:  Crystal Vasquez is a 51 yr old female who presents for follow-up and medication management.  PPHx is significant for Depression, Anxiety, PTSD and possible ADHD, no history of Suicide Attempts, Self Injurious Behavior, or  hospitalizations.   Kym  is doing well with her medications and better able to handle her stressors at work.  She will be establishing with a PCP on November 7.  We will not make any medication changes at this time.  We will continue to monitor.  She will return for follow-up in approximately 6 weeks.   MDD, Recurrent, Moderate  GAD  PTSD: -Continue Zoloft 100 mg daily for depression and anxiety. 30 tablets with 1 refill -Continue Seroquel 50 mg QHS for augmentation, mood stability, and insomnia.  30 tablets with 1 refill.    Collaboration of Care:   Patient/Guardian was advised Release of Information must be obtained prior to any record release in order to collaborate their care with an outside provider. Patient/Guardian was advised if they have not already done so to contact the registration department to sign all necessary forms in order for [place] to release information regarding their care.   Consent: Patient/Guardian gives verbal consent for treatment and assignment of benefits for services provided during this visit. Patient/Guardian expressed understanding and agreed to proceed.    Briant Cedar, MD 01/17/2022, 9:48 AM

## 2022-01-20 ENCOUNTER — Other Ambulatory Visit: Payer: Self-pay

## 2022-01-24 ENCOUNTER — Ambulatory Visit: Payer: Self-pay | Admitting: Critical Care Medicine

## 2022-01-24 ENCOUNTER — Encounter: Payer: Self-pay | Admitting: Hematology and Oncology

## 2022-01-24 ENCOUNTER — Other Ambulatory Visit: Payer: Self-pay

## 2022-01-24 ENCOUNTER — Encounter: Payer: Self-pay | Admitting: Internal Medicine

## 2022-01-24 ENCOUNTER — Ambulatory Visit: Payer: Self-pay | Attending: Internal Medicine | Admitting: Internal Medicine

## 2022-01-24 ENCOUNTER — Other Ambulatory Visit (HOSPITAL_COMMUNITY)
Admission: RE | Admit: 2022-01-24 | Discharge: 2022-01-24 | Disposition: A | Discharge status: Home / Self Care | Payer: Self-pay | Source: Ambulatory Visit | Attending: Internal Medicine | Admitting: Internal Medicine

## 2022-01-24 VITALS — BP 166/79 | HR 53 | Temp 98.0°F | Ht 66.0 in | Wt 175.0 lb

## 2022-01-24 DIAGNOSIS — N898 Other specified noninflammatory disorders of vagina: Secondary | ICD-10-CM

## 2022-01-24 DIAGNOSIS — Z8639 Personal history of other endocrine, nutritional and metabolic disease: Secondary | ICD-10-CM

## 2022-01-24 DIAGNOSIS — E1165 Type 2 diabetes mellitus with hyperglycemia: Secondary | ICD-10-CM

## 2022-01-24 DIAGNOSIS — Z1231 Encounter for screening mammogram for malignant neoplasm of breast: Secondary | ICD-10-CM

## 2022-01-24 DIAGNOSIS — E1159 Type 2 diabetes mellitus with other circulatory complications: Secondary | ICD-10-CM

## 2022-01-24 DIAGNOSIS — E785 Hyperlipidemia, unspecified: Secondary | ICD-10-CM

## 2022-01-24 DIAGNOSIS — M5412 Radiculopathy, cervical region: Secondary | ICD-10-CM

## 2022-01-24 DIAGNOSIS — E1169 Type 2 diabetes mellitus with other specified complication: Secondary | ICD-10-CM

## 2022-01-24 DIAGNOSIS — Z2821 Immunization not carried out because of patient refusal: Secondary | ICD-10-CM

## 2022-01-24 DIAGNOSIS — I152 Hypertension secondary to endocrine disorders: Secondary | ICD-10-CM

## 2022-01-24 DIAGNOSIS — Z5900 Homelessness unspecified: Secondary | ICD-10-CM

## 2022-01-24 DIAGNOSIS — Z1211 Encounter for screening for malignant neoplasm of colon: Secondary | ICD-10-CM

## 2022-01-24 LAB — POCT GLYCOSYLATED HEMOGLOBIN (HGB A1C): HbA1c, POC (controlled diabetic range): 7.8 % — AB (ref 0.0–7.0)

## 2022-01-24 LAB — GLUCOSE, POCT (MANUAL RESULT ENTRY): POC Glucose: 160 mg/dl — AB (ref 70–99)

## 2022-01-24 MED ORDER — SIMVASTATIN 20 MG PO TABS
20.0000 mg | ORAL_TABLET | Freq: Every day | ORAL | 6 refills | Status: DC
  Filled 2022-01-24: qty 30, 30d supply, fill #0
  Filled 2022-02-21: qty 30, 30d supply, fill #1
  Filled 2022-03-27 – 2022-05-05 (×2): qty 30, 30d supply, fill #2
  Filled 2022-06-06: qty 30, 30d supply, fill #3
  Filled 2022-08-04 (×2): qty 30, 30d supply, fill #4

## 2022-01-24 MED ORDER — OLMESARTAN MEDOXOMIL 40 MG PO TABS
40.0000 mg | ORAL_TABLET | Freq: Every day | ORAL | 3 refills | Status: DC
  Filled 2022-01-24: qty 90, 90d supply, fill #0
  Filled 2022-02-21: qty 30, 30d supply, fill #0
  Filled 2022-03-27 – 2022-08-04 (×3): qty 30, 30d supply, fill #1

## 2022-01-24 MED ORDER — GLIPIZIDE 10 MG PO TABS
10.0000 mg | ORAL_TABLET | Freq: Two times a day (BID) | ORAL | 1 refills | Status: DC
  Filled 2022-01-24: qty 60, 30d supply, fill #0

## 2022-01-24 MED ORDER — METFORMIN HCL 1000 MG PO TABS
1000.0000 mg | ORAL_TABLET | Freq: Two times a day (BID) | ORAL | 0 refills | Status: DC
  Filled 2022-01-24: qty 60, 30d supply, fill #0

## 2022-01-24 MED ORDER — HYDRALAZINE HCL 25 MG PO TABS
ORAL_TABLET | ORAL | 6 refills | Status: DC
  Filled 2022-01-24: qty 150, 30d supply, fill #0
  Filled 2022-02-21: qty 150, 30d supply, fill #1
  Filled 2022-03-27 – 2022-08-04 (×3): qty 150, 30d supply, fill #2

## 2022-01-24 MED ORDER — BISOPROLOL FUMARATE 10 MG PO TABS
10.0000 mg | ORAL_TABLET | Freq: Every day | ORAL | 1 refills | Status: DC
  Filled 2022-01-24: qty 90, 90d supply, fill #0
  Filled 2022-02-21: qty 30, 30d supply, fill #0
  Filled 2022-03-27 – 2022-08-04 (×3): qty 30, 30d supply, fill #1
  Filled 2022-09-12: qty 30, 30d supply, fill #2
  Filled 2022-10-30: qty 30, 30d supply, fill #3
  Filled 2022-12-07 (×2): qty 30, 30d supply, fill #4
  Filled 2023-01-04: qty 30, 30d supply, fill #5

## 2022-01-24 MED ORDER — AMLODIPINE BESYLATE 10 MG PO TABS
10.0000 mg | ORAL_TABLET | Freq: Every day | ORAL | 1 refills | Status: DC
  Filled 2022-01-24: qty 30, 30d supply, fill #0
  Filled 2022-02-21: qty 30, 30d supply, fill #1
  Filled 2022-03-27 – 2022-05-05 (×2): qty 30, 30d supply, fill #2
  Filled 2022-06-06: qty 30, 30d supply, fill #3
  Filled 2022-08-04 (×2): qty 30, 30d supply, fill #4

## 2022-01-24 MED ORDER — HYDROCHLOROTHIAZIDE 25 MG PO TABS
25.0000 mg | ORAL_TABLET | Freq: Every day | ORAL | 1 refills | Status: DC
  Filled 2022-01-24: qty 30, 30d supply, fill #0
  Filled 2022-02-21: qty 30, 30d supply, fill #1
  Filled 2022-03-27 – 2022-05-05 (×2): qty 30, 30d supply, fill #2
  Filled 2022-06-06: qty 30, 30d supply, fill #3
  Filled 2022-08-04 (×2): qty 30, 30d supply, fill #4

## 2022-01-24 MED ORDER — GABAPENTIN 300 MG PO CAPS
300.0000 mg | ORAL_CAPSULE | Freq: Every day | ORAL | 1 refills | Status: DC
  Filled 2022-01-24: qty 90, 90d supply, fill #0
  Filled 2022-05-05: qty 90, 90d supply, fill #1

## 2022-01-24 NOTE — Progress Notes (Signed)
Patient ID: Crystal Vasquez, female    DOB: 1970/08/11  MRN: [id number]  CC: Establish Care (Est Care/ New pt./Numbness on both upper arms and radiating down X3-4 mo. /Histerectomy in March, clear vaginal discharge, odor X3-4 weeks. /No flu vax.)   Subjective: Crystal Vasquez is a 51 y.o. female who presents to establish care. Her concerns today include:  Patient with history of HTN, DM type II, HL, MDD/GAD/PTSD/possible ADHD followed by psychiatry,  Thyroid ds  Previous PCP was NP eats Gypsy Balsam at our [place] site.she did not like the service she was getting there.  She then started seeing a nurse practitioner at [place], [facility] but did not like it there either.  pt is homeless living out of her car since April/May of this yr She is not working with anyone to try to get stable housing and does not wish to.  She states that she plans to leave [facility] once things get straight with her health.  She is thinking about moving to [place] or [place]. 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    C/o Vaginal dischg with odor for 3-4 wks.  Not sexually active at this time Had hysterectomy for fibroids earlier this yr Some pain over scar.  DM: Results for orders placed or performed in visit on 01/24/22  POCT glucose (manual entry)  Result Value Ref Range   POC Glucose 160 (A) 70 - 99 mg/dl  POCT glycosylated hemoglobin (Hb A1C)  Result Value Ref Range   Hemoglobin A1C     HbA1c POC (<> result, manual entry)     HbA1c, POC (prediabetic range)     HbA1c, POC (controlled diabetic range) 7.8 (A) 0.0 - 7.0 %  Not checking BS regularly Suppose to be on Metformin 1 gram BID and Glucotrol 10 mg BID but forgets evening dose most evenings.   No numbness in feet Last eye 1 yr ago  C/o Tingling in upper arms, sometimes in lower arms x 3 mths.   Intermittent Had frozen shoulder LT side, they fixed with inj and  P.T Endorses pain in neck x 3 mths.  Intermittent as well Drop thinks a lot from hands but not sure of weakness  HTN: She is supposed to be on amlodipine 10 mg daily, HCTZ 25 mg daily and hydralazine 25 mg 2 tablets in the mornings, 1 tablet in the afternoon and 2 tablets in the evenings.  She takes amlodipine and HCTZ consistently but admits that she often forgets to take her afternoon and evening dose of hydralazine. Difficult to limit salt in the foods that she often eat out since she is living out of her car.  Thyroid: dx with Graves ds 20 yrs ago.  Remission several times.  TSH earlier this year was low but most recent free T3 and T4 nl Has appt with endo end of Dec.  Not on med Feels hot a lot No wgh changes.    HM: declines flu and shingles vaccine.  Last c-scope 10 yrs ago,no polyps.  Due for mammogram. Patient Active Problem List   Diagnosis Date Noted   Excessive daytime sleepiness 10/31/2021   Snoring 10/31/2021   Fever postop 05/19/2021   Uterine polyp 05/09/2021   Hypertension 12/03/2020   Uncontrolled type 2 diabetes mellitus with hyperglycemia ([place]) 12/03/2020   Uterine mass 12/03/2020   Abnormal uterine bleeding (AUB) 11/11/2020   History of uterine fibroid 11/11/2020  Iron deficiency anemia due to chronic blood loss 11/11/2020   History of menorrhagia 11/11/2020     Current Outpatient Medications on File Prior to Visit  Medication Sig Dispense Refill   ACETAMINOPHEN EXTRA STRENGTH 500 MG capsule SMARTSIG:2 Capsule(s) By Mouth Daily PRN     amLODipine (NORVASC) 10 MG tablet Take 1 tablet (10 mg total) by mouth daily. 90 tablet 0   bisoprolol (ZEBETA) 10 MG tablet Take 1 tablet (10 mg total) by mouth daily. 90 tablet 3   bisoprolol (ZEBETA) 10 MG tablet Take 1 tablet (10 mg total) by mouth daily. 30 tablet 0   ferrous sulfate 325 (65 FE) MG tablet Take 325 mg by mouth in the morning.     fluticasone (VERAMYST) 27.5 MCG/SPRAY nasal spray Place 1 spray into the nose  in the morning.     glipiZIDE (GLUCOTROL) 10 MG tablet Take 1 tablet (10 mg total) by mouth 2 (two) times daily before a meal. 180 tablet 0   hydrALAZINE (APRESOLINE) 25 MG tablet 2 tablets in morning and evening, 1 tablet in afternoon 90 tablet 2   hydrochlorothiazide (HYDRODIURIL) 25 MG tablet Take 1 tablet (25 mg total) by mouth daily. 90 tablet 0   hydrocortisone 1 % ointment Apply 1 Application topically 2 (two) times daily. 30 g 0   LASIX 20 MG tablet Take 20 mg by mouth daily as needed.     meloxicam (MOBIC) 15 MG tablet Take 15 mg by mouth daily as needed.     metFORMIN (GLUCOPHAGE) 1000 MG tablet Take 1 tablet (1,000 mg total) by mouth 2 (two) times daily with a meal. 180 tablet 0   olmesartan (BENICAR) 40 MG tablet Take 1 tablet (40 mg total) by mouth daily. 90 tablet 3   olmesartan (BENICAR) 40 MG tablet Take 1 tablet (40 mg total) by mouth daily. 30 tablet 0   omeprazole (PRILOSEC) 20 MG capsule Take 20 mg by mouth daily.     QUEtiapine (SEROQUEL) 50 MG tablet Take 1 tablet (50 mg total) by mouth at bedtime. 30 tablet 1   sertraline (ZOLOFT) 100 MG tablet Take 1 tablet (100 mg total) by mouth daily. 30 tablet 1   simvastatin (ZOCOR) 20 MG tablet Take 20 mg by mouth at bedtime.     No current facility-administered medications on file prior to visit.    No Known Allergies  Social History   Socioeconomic History   Marital status: Single    Spouse name: Not on file   Number of children: Not on file   Years of education: Not on file   Highest education level: Not on file  Occupational History   Not on file  Tobacco Use   Smoking status: Never    Passive exposure: Never   Smokeless tobacco: Never  Vaping Use   Vaping Use: Never used  Substance and Sexual Activity   Alcohol use: Not Currently   Drug use: Never   Sexual activity: Not Currently  Other Topics Concern   Not on file  Social History Narrative   Not on file   Social Determinants of Health   Financial  Resource Strain: High Risk (10/12/2021)   Overall Financial Resource Strain (CARDIA)    Difficulty of Paying Living Expenses: Very hard  Food Insecurity: Food Insecurity Present (10/12/2021)   Hunger Vital Sign    Worried About Running Out of Food in the Last Year: Often true    Ran Out of Food in the Last Year: Often true  Transportation Needs: Unmet Transportation Needs (10/12/2021)   PRAPARE - Hydrologist (Medical): Yes    Lack of Transportation (Non-Medical): Yes  Physical Activity: Not on file  Stress: Not on file  Social Connections: Not on file  Intimate Partner Violence: Not on file    Family History  Problem Relation Age of Onset   Diabetes Mother    Hypertension Mother    Diabetes Father    Hypertension Father    Endometrial cancer Maternal Aunt    Colon cancer Neg Hx    Breast cancer Neg Hx    Ovarian cancer Neg Hx    Pancreatic cancer Neg Hx    Prostate cancer Neg Hx     Past Surgical History:  Procedure Laterality Date   ABDOMINAL HYSTERECTOMY     BIOPSY THYROID     IR ANGIOGRAM PELVIS SELECTIVE OR SUPRASELECTIVE  05/09/2021   IR ANGIOGRAM SELECTIVE EACH ADDITIONAL VESSEL  05/09/2021   IR AORTAGRAM ABDOMINAL SERIALOGRAM  05/09/2021   IR EMBO TUMOR ORGAN ISCHEMIA INFARCT INC GUIDE ROADMAPPING  05/09/2021   IR RADIOLOGIST EVAL & MGMT  04/11/2021   IR US GUIDE VASC ACCESS LEFT  05/09/2021   ROBOTIC ASSISTED LAPAROSCOPIC HYSTERECTOMY AND SALPINGECTOMY Bilateral 05/18/2021   Procedure: XI ROBOTIC ASSISTED LAPAROSCOPIC HYSTERECTOMY AND SALPINGECTOMY, UTERUS MORE THAN 250 GRAMS;  Surgeon: Lafonda Mosses, MD;  Location: [facility];  Service: Gynecology;  Laterality: Bilateral;    ROS: Review of Systems Negative except as stated above  PHYSICAL EXAM: BP (!) 158/91 (BP Location: Left Arm, Patient Position: Sitting, Cuff Size: Normal)   Pulse (!) 53   Temp 98 F (36.7 C) (Oral)   Ht '5\' 6"'$  (1.676 m)   Wt 175 lb (79.4 kg)   LMP  04/27/2021 (Exact Date)   SpO2 99%   BMI 28.25 kg/m   Physical Exam  General appearance - alert, well appearing, middle-age Caucasian female and in no distress Mental status - normal mood, behavior, speech, dress, motor activity, and thought processes Mouth - mucous membranes moist, pharynx normal without lesions Neck -  no significant adenopathy or thyromegaly. Chest - clear to auscultation, no wheezes, rales or rhonchi, symmetric air entry Heart - normal rate, regular rhythm, normal S1, S2, no murmurs, rubs, clicks or gallops Extremities - peripheral pulses normal, no pedal edema, no clubbing or cyanosis MSK: No tenderness on palpation of the cervical spine.  Mild discomfort with passive range of motion of the neck. Neuro: Power in both upper extremities 5/5 proximally and distally.  Gross sensation intact in the upper extremities.     Latest Ref Rng & Units 10/27/2021    9:56 AM 05/23/2021    4:09 AM 05/22/2021    4:15 AM  CMP  Glucose 70 - 99 mg/dL 178  136  168   BUN 6 - 20 mg/dL '16  10  9   '$ Creatinine 0.44 - 1.00 mg/dL 0.72  0.54  0.63   Sodium 135 - 145 mmol/L 139  137  135   Potassium 3.5 - 5.1 mmol/L 3.3  4.4  3.9   Chloride 98 - 111 mmol/L 101  104  105   CO2 22 - 32 mmol/L '31  22  22   '$ Calcium 8.9 - 10.3 mg/dL 9.5  9.0  8.5   Total Protein 6.5 - 8.1 g/dL 7.7     Total Bilirubin 0.3 - 1.2 mg/dL 0.2     Alkaline Phos 38 - 126 U/L 60  AST 15 - 41 U/L 16     ALT 0 - 44 U/L 19      Lipid Panel  No results found for: "CHOL", "TRIG", "HDL", "CHOLHDL", "VLDL", "LDLCALC", "LDLDIRECT"  CBC    Component Value Date/Time   WBC 4.5 10/27/2021 0956   WBC 5.4 05/23/2021 0409   RBC 4.28 10/27/2021 0956   RBC 4.27 10/27/2021 0955   HGB 13.1 10/27/2021 0956   HGB 9.3 (L) 11/11/2020 1545   HCT 37.7 10/27/2021 0956   HCT 28.5 (L) 11/11/2020 1545   PLT 243 10/27/2021 0956   PLT 430 11/11/2020 1545   MCV 88.1 10/27/2021 0956   MCV 87 11/11/2020 1545   MCH 30.6 10/27/2021 0956    MCHC 34.7 10/27/2021 0956   RDW 12.9 10/27/2021 0956   RDW 19.1 (H) 11/11/2020 1545   LYMPHSABS 1.7 10/27/2021 0956   LYMPHSABS 1.9 11/11/2020 1545   MONOABS 0.4 10/27/2021 0956   EOSABS 0.1 10/27/2021 0956   EOSABS 0.1 11/11/2020 1545   BASOSABS 0.0 10/27/2021 0956   BASOSABS 0.0 11/11/2020 1545    ASSESSMENT AND PLAN:  1. Uncontrolled type 2 diabetes mellitus with hyperglycemia ([place]) Encourage healthy eating habits and try to move as much as she can.  Right now she eats out a lot that she is currently homeless. Encouraged her to set a timer on her phone that reminds her to take the evening dose of metformin and glipizide.  She is agreeable to doing that. - POCT glucose (manual entry) - POCT glycosylated hemoglobin (Hb A1C) - Microalbumin / creatinine urine ratio - glipiZIDE (GLUCOTROL) 10 MG tablet; Take 1 tablet (10 mg total) by mouth 2 (two) times daily before a meal.  Dispense: 180 tablet; Refill: 1 - metFORMIN (GLUCOPHAGE) 1000 MG tablet; Take 1 tablet (1,000 mg total) by mouth 2 (two) times daily with a meal.  Dispense: 180 tablet; Refill: 0  2. Hypertension associated with type 2 diabetes mellitus ([place]) Not at goal.  She has not been consistent with taking the hydralazine 3 times a day.  Again I have encouraged her to set a timer on her phone to remind her to take the afternoon and evening dose.  She will continue amlodipine, bisoprolol, Benicar and HCTZ - amLODipine (NORVASC) 10 MG tablet; Take 1 tablet (10 mg total) by mouth daily.  Dispense: 90 tablet; Refill: 1 - bisoprolol (ZEBETA) 10 MG tablet; Take 1 tablet (10 mg total) by mouth daily.  Dispense: 90 tablet; Refill: 1 - hydrALAZINE (APRESOLINE) 25 MG tablet; Take 2 tablets by mouth in morning and evening and 1 tablet in afternoon  Dispense: 150 tablet; Refill: 6 - hydrochlorothiazide (HYDRODIURIL) 25 MG tablet; Take 1 tablet (25 mg total) by mouth daily.  Dispense: 90 tablet; Refill: 1 - olmesartan (BENICAR) 40 MG  tablet; Take 1 tablet (40 mg total) by mouth daily.  Dispense: 90 tablet; Refill: 3  3. Hyperlipidemia associated with type 2 diabetes mellitus (HCC) - simvastatin (ZOCOR) 20 MG tablet; Take 1 tablet (20 mg total) by mouth at bedtime.  Dispense: 30 tablet; Refill: 6  4. Vaginal discharge - Cervicovaginal ancillary only  5. History of thyroid disease Keep upcoming appointment with endocrinology.  6. Cervical radiculopathy We will get a baseline x-ray of the cervical spine.  She is agreeable to trying gabapentin at bedtime to help decrease the tingling in the arms. - DG Cervical Spine Complete; Future - gabapentin (NEURONTIN) 300 MG capsule; Take 1 capsule (300 mg total) by mouth at bedtime.  Dispense: 90 capsule; Refill: 1  7. Homeless Declines referral to case worker.  She is currently working and plans to leave the [place] area at some point.  8. Screening for colon cancer - Fecal occult blood, imunochemical([facility])  9. Encounter for screening mammogram for malignant neoplasm of breast - MM Digital Screening; Future  10. Influenza vaccination declined     Patient was given the opportunity to ask questions.  Patient verbalized understanding of the plan and was able to repeat key elements of the plan.   This documentation was completed using Radio producer.  Any transcriptional errors are unintentional.  No orders of the defined types were placed in this encounter.    Requested Prescriptions    No prescriptions requested or ordered in this encounter    No follow-ups on file.  Karle Plumber, MD, FACP

## 2022-01-25 LAB — CERVICOVAGINAL ANCILLARY ONLY
Bacterial Vaginitis (gardnerella): NEGATIVE
Candida Glabrata: NEGATIVE
Candida Vaginitis: NEGATIVE
Chlamydia: NEGATIVE
Comment: NEGATIVE
Comment: NEGATIVE
Comment: NEGATIVE
Comment: NEGATIVE
Comment: NEGATIVE
Comment: NORMAL
Neisseria Gonorrhea: NEGATIVE
Trichomonas: NEGATIVE

## 2022-01-26 LAB — MICROALBUMIN / CREATININE URINE RATIO
Creatinine, Urine: 25.9 mg/dL
Microalb/Creat Ratio: 15 mg/g creat (ref 0–29)
Microalbumin, Urine: 3.8 ug/mL

## 2022-02-06 ENCOUNTER — Ambulatory Visit (HOSPITAL_COMMUNITY)
Admission: RE | Admit: 2022-02-06 | Discharge: 2022-02-06 | Disposition: A | Discharge status: Home / Self Care | Payer: Self-pay | Source: Ambulatory Visit | Attending: Internal Medicine | Admitting: Internal Medicine

## 2022-02-06 DIAGNOSIS — M5412 Radiculopathy, cervical region: Secondary | ICD-10-CM | POA: Insufficient documentation

## 2022-02-20 ENCOUNTER — Encounter: Payer: Self-pay | Admitting: Hematology and Oncology

## 2022-02-20 ENCOUNTER — Other Ambulatory Visit: Payer: Self-pay

## 2022-02-20 ENCOUNTER — Encounter: Payer: Self-pay | Admitting: Internal Medicine

## 2022-02-20 ENCOUNTER — Ambulatory Visit (INDEPENDENT_AMBULATORY_CARE_PROVIDER_SITE_OTHER): Payer: Self-pay | Admitting: Internal Medicine

## 2022-02-20 VITALS — BP 132/80 | HR 60 | Ht 66.0 in | Wt 172.0 lb

## 2022-02-20 DIAGNOSIS — E1165 Type 2 diabetes mellitus with hyperglycemia: Secondary | ICD-10-CM

## 2022-02-20 DIAGNOSIS — E0590 Thyrotoxicosis, unspecified without thyrotoxic crisis or storm: Secondary | ICD-10-CM

## 2022-02-20 LAB — T4, FREE: Free T4: 0.61 ng/dL (ref 0.60–1.60)

## 2022-02-20 LAB — POCT GLUCOSE (DEVICE FOR HOME USE): Glucose Fasting, POC: 205 mg/dL — AB (ref 70–99)

## 2022-02-20 LAB — TSH: TSH: 0.21 u[IU]/mL — ABNORMAL LOW (ref 0.35–5.50)

## 2022-02-20 MED ORDER — METFORMIN HCL 1000 MG PO TABS
1000.0000 mg | ORAL_TABLET | Freq: Two times a day (BID) | ORAL | 2 refills | Status: DC
  Filled 2022-02-20: qty 180, 90d supply, fill #0
  Filled 2022-03-27 – 2022-08-04 (×2): qty 180, 90d supply, fill #1
  Filled 2022-08-04: qty 60, 30d supply, fill #1
  Filled 2022-09-12: qty 60, 30d supply, fill #2
  Filled 2022-10-30: qty 60, 30d supply, fill #3
  Filled 2023-01-04: qty 60, 30d supply, fill #4

## 2022-02-20 MED ORDER — GLIPIZIDE 10 MG PO TABS
10.0000 mg | ORAL_TABLET | Freq: Two times a day (BID) | ORAL | 2 refills | Status: DC
  Filled 2022-02-20: qty 180, 90d supply, fill #0
  Filled 2022-03-27 – 2022-06-06 (×2): qty 180, 90d supply, fill #1
  Filled 2022-08-04: qty 180, 90d supply, fill #2

## 2022-02-20 NOTE — Progress Notes (Unsigned)
Name: Crystal Vasquez  MRN/ DOB: [id number], 03/13/1971   Age/ Sex: 51 y.o., female    PCP: Ladell Pier, MD   Reason for Endocrinology Evaluation: Type 2 Diabetes Mellitus     Date of Initial Endocrinology Visit: 02/20/2022     PATIENT IDENTIFIER: Crystal Vasquez is a 51 y.o. female with a past medical history of HTN, T2DM, history of thyroid disease. The patient presented for initial endocrinology clinic visit on 02/20/2022 for consultative assistance with her diabetes management.    HPI: Crystal Vasquez was    Diagnosed with DM 10-15 yrs ago  Prior Medications tried/Intolerance: Glipizide was started ~ 4 yrs ago  Currently checking blood sugars occasionally Hypoglycemia episodes : yes  not recently      , last episode was 2.5 yrs ago        Symptoms: yes                  Hemoglobin A1c has ranged from 6.7% in 2022, peaking at 9.0% in 2022.  In terms of diet, the patient  is working on avoiding sugar-sweetened beverages   She lives in her car which makes it difficult to eat healthy    Patient follows with oncology for iron deficiency anemia secondary to heavy menstruation   She had an FNA ~ 4 yrs ago of the thyroid while living in [place] Denies local neck swelling   Has Maternal grandmother and cousin and maternal aunt with  Hx of MEN  Has occasional palpitations  Denies loose stools    HOME DIABETES REGIMEN: Glipizide 10 mg twice daily Metformin 1000 mg twice daily     Statin: Yes ACE-I/ARB: Yes    METER DOWNLOAD SUMMARY: Did not bring   DIABETIC COMPLICATIONS: Microvascular complications:   Denies: CKD, neuropathy, retinopathy  Last eye exam: Completed 2022  Macrovascular complications:   Denies: CAD, PVD, CVA   PAST HISTORY: Past Medical History:  Past Medical History:  Diagnosis Date   Anemia    Asthma    due to acid reflux   Depression    DM2    Dyspnea    GERD (gastroesophageal reflux disease)    Headache    Heart murmur     benign   Hypertension    Obesity (BMI 30-39.9)    Thyroid disease    Past Surgical History:  Past Surgical History:  Procedure Laterality Date   ABDOMINAL HYSTERECTOMY     BIOPSY THYROID     IR ANGIOGRAM PELVIS SELECTIVE OR SUPRASELECTIVE  05/09/2021   IR ANGIOGRAM SELECTIVE EACH ADDITIONAL VESSEL  05/09/2021   IR AORTAGRAM ABDOMINAL SERIALOGRAM  05/09/2021   IR EMBO TUMOR ORGAN ISCHEMIA INFARCT INC GUIDE ROADMAPPING  05/09/2021   IR RADIOLOGIST EVAL & MGMT  04/11/2021   IR US GUIDE VASC ACCESS LEFT  05/09/2021   ROBOTIC ASSISTED LAPAROSCOPIC HYSTERECTOMY AND SALPINGECTOMY Bilateral 05/18/2021   Procedure: XI ROBOTIC ASSISTED LAPAROSCOPIC HYSTERECTOMY AND SALPINGECTOMY, UTERUS MORE THAN 250 GRAMS;  Surgeon: Lafonda Mosses, MD;  Location: [facility];  Service: Gynecology;  Laterality: Bilateral;    Social History:  reports that she has never smoked. She has never been exposed to tobacco smoke. She has never used smokeless tobacco. She reports that she does not currently use alcohol. She reports that she does not use drugs. Family History:  Family History  Problem Relation Age of Onset   Diabetes Mother    Hypertension Mother    Diabetes Father    Hypertension Father  Endometrial cancer Maternal Aunt    Colon cancer Neg Hx    Breast cancer Neg Hx    Ovarian cancer Neg Hx    Pancreatic cancer Neg Hx    Prostate cancer Neg Hx      HOME MEDICATIONS: Allergies as of 02/20/2022   No Known Allergies      Medication List        Accurate as of February 20, 2022  3:48 PM. If you have any questions, ask your nurse or doctor.          Acetaminophen Extra Strength 500 MG Caps SMARTSIG:2 Capsule(s) By Mouth Daily PRN   amLODipine 10 MG tablet Commonly known as: NORVASC Take 1 tablet (10 mg total) by mouth daily.   bisoprolol 10 MG tablet Commonly known as: ZEBETA Take 1 tablet (10 mg total) by mouth daily. What changed: Another medication with the same name was  removed. Continue taking this medication, and follow the directions you see here. Changed by: Dorita Sciara, MD   ferrous sulfate 325 (65 FE) MG tablet Take 325 mg by mouth in the morning.   fluticasone 27.5 MCG/SPRAY nasal spray Commonly known as: VERAMYST Place 1 spray into the nose in the morning.   gabapentin 300 MG capsule Commonly known as: NEURONTIN Take 1 capsule (300 mg total) by mouth at bedtime.   glipiZIDE 10 MG tablet Commonly known as: GLUCOTROL Take 1 tablet (10 mg total) by mouth 2 (two) times daily before a meal.   hydrALAZINE 25 MG tablet Commonly known as: APRESOLINE Take 2 tablets by mouth in morning and evening and 1 tablet in afternoon   hydrochlorothiazide 25 MG tablet Commonly known as: HYDRODIURIL Take 1 tablet (25 mg total) by mouth daily.   hydrocortisone 1 % ointment Apply 1 Application topically 2 (two) times daily.   Lasix 20 MG tablet Generic drug: furosemide Take 20 mg by mouth daily as needed.   meloxicam 15 MG tablet Commonly known as: MOBIC Take 15 mg by mouth daily as needed.   metFORMIN 1000 MG tablet Commonly known as: GLUCOPHAGE Take 1 tablet (1,000 mg total) by mouth 2 (two) times daily with a meal.   olmesartan 40 MG tablet Commonly known as: BENICAR Take 1 tablet (40 mg total) by mouth daily.   omeprazole 20 MG capsule Commonly known as: PRILOSEC Take 20 mg by mouth daily.   QUEtiapine 50 MG tablet Commonly known as: SEROquel Take 1 tablet (50 mg total) by mouth at bedtime.   sertraline 100 MG tablet Commonly known as: Zoloft Take 1 tablet (100 mg total) by mouth daily.   simvastatin 20 MG tablet Commonly known as: ZOCOR Take 1 tablet (20 mg total) by mouth at bedtime.         ALLERGIES: No Known Allergies   REVIEW OF SYSTEMS: A comprehensive ROS was conducted with the patient and is negative except as per HPI and below:  ROS    OBJECTIVE:   VITAL SIGNS: BP 132/80 (BP Location: Left Arm,  Patient Position: Sitting, Cuff Size: Large)   Pulse 60   Ht '5\' 6"'$  (1.676 m)   Wt 172 lb (78 kg)   LMP 04/27/2021 (Exact Date)   SpO2 95%   BMI 27.76 kg/m    PHYSICAL EXAM:  General: Pt appears well and is in NAD  Neck: General: Supple without adenopathy or carotid bruits. Thyroid: Thyroid size normal.  No goiter or nodules appreciated.   Lungs: Clear with good BS bilat with  no rales, rhonchi, or wheezes  Heart: RRR   Abdomen:  soft, nontender  Extremities:  Lower extremities - No pretibial edema. No lesions.  Neuro: MS is good with appropriate affect, pt is alert and Ox3    DM foot exam: 02/20/2022  The skin of the feet is intact without sores or ulcerations. The pedal pulses are 2+ on right and 2+ on left. The sensation is intact to a screening 5.07, 10 gram monofilament bilaterally   DATA REVIEWED:  Lab Results  Component Value Date   HGBA1C 7.8 (A) 01/24/2022   HGBA1C 8.5 (A) 06/29/2021   HGBA1C 7.2 (A) 03/30/2021   Lab Results  Component Value Date   CREATININE 0.72 10/27/2021   Lab Results  Component Value Date   MICRALBCREAT 15 01/24/2022     ASSESSMENT / PLAN / RECOMMENDATIONS:   1) Type 2 Diabetes Mellitus, Sub-Optimally controlled, Without complications - Most recent A1c of 7.8 %. Goal A1c < 7.0 %.    -A1c is trending down but remains above goal -The patient does admit to imperfect adherence to the second dose of glipizide, we discussed the pharmacokinetics of sulfonylurea and I have encouraged her to possibly use reminders to take glipizide 15-20 minutes before the first and the last meal of the day -Patient was social determinants, currently living in her car, this makes it very difficult to get access to healthy food, but despite that her glucose control is improving -I would not make any changes to her glycemic agents but compliance was encouraged  MEDICATIONS: Take glipizide 10 mg, 1 tablet before breakfast and 1 tablet before supper Continue  metformin 1000 mg twice daily  EDUCATION / INSTRUCTIONS: BG monitoring instructions: Patient is instructed to check her blood sugars 2-3 times a week. Call [facility] if: BG persistently < 70  I reviewed the Rule of 15 for the treatment of hypoglycemia in detail with the patient. Literature supplied.   2) Diabetic complications:  Eye: Does not have known diabetic retinopathy.  Neuro/ Feet: Does not have known diabetic peripheral neuropathy. Renal: Patient does not have known baseline CKD. She is  on an ACEI/ARB at present.  3) Multinodular goiter:  -Patient is s/p benign FNA of a thyroid nodule approximately 4 years ago while living in [place] -No local neck symptoms -We discussed the importance of having a follow-up ultrasound, she is in agreement of this -Of note the patient has a 2 nd and 3rd  degree relatives with multiple endocrine neoplasia  4) Subclinical Hyperthyroidism:  -Patient with occasional palpitations -Possibly due to autonomous thyroid nodule    Follow-up in 6 months  Signed electronically by: Mack Guise, MD  [facility]  [address] [place], [place] [address] Phone: [phone] FAX: [phone]   CC: Ladell Pier, MD [place] [address] Phone: [phone]  Fax: [phone]    Return to Endocrinology clinic as below: Future Appointments  Date Time Provider [place]  03/03/2022  8:00 AM Kai Levins Redgie Grayer, MD [facility] None  05/25/2022 11:10 AM Ladell Pier, MD [facility] None  08/21/2022 10:30 AM Jashanti Clinkscale, Melanie Crazier, MD [facility] None

## 2022-02-20 NOTE — Patient Instructions (Signed)
Take Glipizide 10 mg, 1 tablet Before Breakfast and 1 tablet before Supper  Continue Metformin 1000 mg twice daily     HOW TO TREAT LOW BLOOD SUGARS (Blood sugar LESS THAN 70 MG/DL) Please follow the RULE OF 15 for the treatment of hypoglycemia treatment (when your (blood sugars are less than 70 mg/dL)   STEP 1: Take 15 grams of carbohydrates when your blood sugar is low, which includes:  3-4 GLUCOSE TABS  OR 3-4 OZ OF JUICE OR REGULAR SODA OR ONE TUBE OF GLUCOSE GEL    STEP 2: RECHECK blood sugar in 15 MINUTES STEP 3: If your blood sugar is still low at the 15 minute recheck --> then, go back to STEP 1 and treat AGAIN with another 15 grams of carbohydrates.

## 2022-02-21 ENCOUNTER — Other Ambulatory Visit: Payer: Self-pay

## 2022-02-21 ENCOUNTER — Encounter: Payer: Self-pay | Admitting: Pharmacist

## 2022-02-21 ENCOUNTER — Other Ambulatory Visit: Payer: Self-pay | Admitting: Internal Medicine

## 2022-02-22 ENCOUNTER — Other Ambulatory Visit: Payer: Self-pay

## 2022-02-22 ENCOUNTER — Encounter: Payer: Self-pay | Admitting: Hematology and Oncology

## 2022-02-22 MED ORDER — MELOXICAM 15 MG PO TABS
15.0000 mg | ORAL_TABLET | Freq: Every day | ORAL | 2 refills | Status: DC | PRN
  Filled 2022-02-22: qty 30, 30d supply, fill #0
  Filled 2022-03-27 – 2022-12-07 (×4): qty 30, 30d supply, fill #1

## 2022-02-23 ENCOUNTER — Other Ambulatory Visit: Payer: Self-pay

## 2022-02-24 ENCOUNTER — Other Ambulatory Visit: Payer: Self-pay

## 2022-03-03 ENCOUNTER — Ambulatory Visit (INDEPENDENT_AMBULATORY_CARE_PROVIDER_SITE_OTHER): Payer: No Payment, Other | Admitting: Student in an Organized Health Care Education/Training Program

## 2022-03-03 ENCOUNTER — Other Ambulatory Visit: Payer: Self-pay

## 2022-03-03 VITALS — BP 158/82 | HR 60 | Ht 66.0 in | Wt 171.8 lb

## 2022-03-03 DIAGNOSIS — F331 Major depressive disorder, recurrent, moderate: Secondary | ICD-10-CM

## 2022-03-03 DIAGNOSIS — F4310 Post-traumatic stress disorder, unspecified: Secondary | ICD-10-CM

## 2022-03-03 DIAGNOSIS — F411 Generalized anxiety disorder: Secondary | ICD-10-CM | POA: Diagnosis not present

## 2022-03-03 MED ORDER — QUETIAPINE FUMARATE 50 MG PO TABS
50.0000 mg | ORAL_TABLET | Freq: Every day | ORAL | 1 refills | Status: DC
  Filled 2022-03-03 – 2022-03-27 (×3): qty 30, 30d supply, fill #0

## 2022-03-03 MED ORDER — SERTRALINE HCL 100 MG PO TABS
100.0000 mg | ORAL_TABLET | Freq: Every day | ORAL | 1 refills | Status: DC
  Filled 2022-03-03 – 2022-03-27 (×2): qty 30, 30d supply, fill #0

## 2022-03-03 NOTE — Progress Notes (Signed)
[facility] MD/PA/NP OP Progress Note  03/03/2022 8:30 AM Crystal Vasquez  MRN:  [id number]  Chief Complaint:  Chief Complaint  Patient presents with   Follow-up   Medication Refill   HPI:  Crystal Vasquez is a 51 yr old female who presents for follow-up and medication management.  PPHx is significant for Depression, Anxiety, PTSD and possible ADHD, no history of Suicide Attempts, Self Injurious Behavior, or hospitalizations.   She reports that she is been doing good since her last appointment.  She reports that she is continuing to enjoy work at [employer].  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  She reports that she has been offered a full-time position with benefits.  She reports no side effects with her medications.  She reports that she feels like her old self with her medicines.  Discussed with her that since she is doing well we would not need to make any changes to her medications at this time and she was agreeable with this.  She reports she still sleeping in her car at the [facility] parking lot but reports she does feel safe there.  She reports no SI, HI, or AVH.  She reports her sleep is fair.  She reports her appetite is good.  She reports no other concerns at present.  She will return for follow-up approximately 2 months.   Visit Diagnosis:    ICD-10-CM   1. MDD (major depressive disorder), recurrent episode, moderate (HCC)  F33.1 sertraline (ZOLOFT) 100 MG tablet    QUEtiapine (SEROQUEL) 50 MG tablet    2. Generalized anxiety disorder  F41.1 sertraline (ZOLOFT) 100 MG tablet    QUEtiapine (SEROQUEL) 50 MG tablet    3. PTSD (post-traumatic stress disorder)  F43.10 sertraline (ZOLOFT) 100 MG tablet    QUEtiapine (SEROQUEL) 50 MG tablet      Past Psychiatric History: Depression, Anxiety, PTSD and possible ADHD, no history of Suicide Attempts, Self Injurious  Behavior, or hospitalizations.   Past Medical History:  Past Medical History:  Diagnosis Date   Anemia    Asthma    due to acid reflux   Depression    DM2    Dyspnea    GERD (gastroesophageal reflux disease)    Headache    Heart murmur    benign   Hypertension    Obesity (BMI 30-39.9)    Thyroid disease     Past Surgical History:  Procedure Laterality Date   ABDOMINAL HYSTERECTOMY     BIOPSY THYROID     IR ANGIOGRAM PELVIS SELECTIVE OR SUPRASELECTIVE  05/09/2021   IR ANGIOGRAM SELECTIVE EACH ADDITIONAL VESSEL  05/09/2021   IR AORTAGRAM ABDOMINAL SERIALOGRAM  05/09/2021   IR EMBO TUMOR ORGAN ISCHEMIA INFARCT INC GUIDE ROADMAPPING  05/09/2021   IR RADIOLOGIST EVAL & MGMT  04/11/2021   IR US GUIDE VASC ACCESS LEFT  05/09/2021   ROBOTIC ASSISTED LAPAROSCOPIC HYSTERECTOMY AND SALPINGECTOMY Bilateral 05/18/2021   Procedure: XI ROBOTIC ASSISTED LAPAROSCOPIC HYSTERECTOMY AND SALPINGECTOMY, UTERUS MORE THAN 250 GRAMS;  Surgeon: Lafonda Mosses, MD;  Location: [facility];  Service: Gynecology;  Laterality: Bilateral;    Family Psychiatric History: Mother- Bipolar Disorder, Suicide 102 (OD) Sister- Schizophrenia, Suicide 2010 (OD) Brother- Bipolar Disorder, EtOH Abuse, Polysubstance Abuse  Family History:  Family History  Problem Relation Age of Onset   Diabetes Mother    Hypertension Mother  Diabetes Father    Hypertension Father    Endometrial cancer Maternal Aunt    Colon cancer Neg Hx    Breast cancer Neg Hx    Ovarian cancer Neg Hx    Pancreatic cancer Neg Hx    Prostate cancer Neg Hx     Social History:  Social History   Socioeconomic History   Marital status: Single    Spouse name: Not on file   Number of children: Not on file   Years of education: Not on file   Highest education level: Not on file  Occupational History   Not on file  Tobacco Use   Smoking status: Never    Passive exposure: Never   Smokeless tobacco: Never  Vaping Use   Vaping Use:  Never used  Substance and Sexual Activity   Alcohol use: Not Currently   Drug use: Never   Sexual activity: Not Currently  Other Topics Concern   Not on file  Social History Narrative   Not on file   Social Determinants of Health   Financial Resource Strain: High Risk (10/12/2021)   Overall Financial Resource Strain (CARDIA)    Difficulty of Paying Living Expenses: Very hard  Food Insecurity: Food Insecurity Present (10/12/2021)   Hunger Vital Sign    Worried About Running Out of Food in the Last Year: Often true    Ran Out of Food in the Last Year: Often true  Transportation Needs: Unmet Transportation Needs (10/12/2021)   PRAPARE - Hydrologist (Medical): Yes    Lack of Transportation (Non-Medical): Yes  Physical Activity: Not on file  Stress: Not on file  Social Connections: Not on file    Allergies: No Known Allergies  Metabolic Disorder Labs: Lab Results  Component Value Date   HGBA1C 7.8 (A) 01/24/2022   No results found for: "PROLACTIN" No results found for: "CHOL", "TRIG", "HDL", "CHOLHDL", "VLDL", "LDLCALC" Lab Results  Component Value Date   TSH 0.21 (L) 02/20/2022   TSH 0.134 (L) 06/29/2021    Therapeutic Level Labs: No results found for: "LITHIUM" No results found for: "VALPROATE" No results found for: "CBMZ"  Current Medications: Current Outpatient Medications  Medication Sig Dispense Refill   ACETAMINOPHEN EXTRA STRENGTH 500 MG capsule SMARTSIG:2 Capsule(s) By Mouth Daily PRN     amLODipine (NORVASC) 10 MG tablet Take 1 tablet (10 mg total) by mouth daily. 90 tablet 1   bisoprolol (ZEBETA) 10 MG tablet Take 1 tablet (10 mg total) by mouth daily. 90 tablet 1   ferrous sulfate 325 (65 FE) MG tablet Take 325 mg by mouth in the morning.     fluticasone (VERAMYST) 27.5 MCG/SPRAY nasal spray Place 1 spray into the nose in the morning.     gabapentin (NEURONTIN) 300 MG capsule Take 1 capsule (300 mg total) by mouth at bedtime. 90  capsule 1   glipiZIDE (GLUCOTROL) 10 MG tablet Take 1 tablet (10 mg total) by mouth 2 (two) times daily before a meal. 180 tablet 2   hydrALAZINE (APRESOLINE) 25 MG tablet Take 2 tablets by mouth in morning and evening and 1 tablet in afternoon 150 tablet 6   hydrochlorothiazide (HYDRODIURIL) 25 MG tablet Take 1 tablet (25 mg total) by mouth daily. 90 tablet 1   hydrocortisone 1 % ointment Apply 1 Application topically 2 (two) times daily. 28 g 0   LASIX 20 MG tablet Take 20 mg by mouth daily as needed.     meloxicam (MOBIC)  15 MG tablet Take 1 tablet (15 mg total) by mouth daily as needed. 30 tablet 2   metFORMIN (GLUCOPHAGE) 1000 MG tablet Take 1 tablet (1,000 mg total) by mouth 2 (two) times daily with a meal. 180 tablet 2   olmesartan (BENICAR) 40 MG tablet Take 1 tablet (40 mg total) by mouth daily. 90 tablet 3   omeprazole (PRILOSEC) 20 MG capsule Take 20 mg by mouth daily.     QUEtiapine (SEROQUEL) 50 MG tablet Take 1 tablet (50 mg total) by mouth at bedtime. 30 tablet 1   sertraline (ZOLOFT) 100 MG tablet Take 1 tablet (100 mg total) by mouth daily. 30 tablet 1   simvastatin (ZOCOR) 20 MG tablet Take 1 tablet (20 mg total) by mouth at bedtime. 30 tablet 6   No current facility-administered medications for this visit.     Musculoskeletal: Strength & Muscle Tone: within normal limits Gait & Station: normal Patient leans: N/A  Psychiatric Specialty Exam: Review of Systems  Respiratory:  Negative for shortness of breath.   Cardiovascular:  Negative for chest pain.  Gastrointestinal:  Negative for abdominal pain, constipation, diarrhea, nausea and vomiting.  Musculoskeletal:  Positive for neck pain.  Neurological:  Negative for dizziness, weakness and headaches.  Psychiatric/Behavioral:  Negative for dysphoric mood, hallucinations, sleep disturbance and suicidal ideas. The patient is not nervous/anxious.     Blood pressure (!) 158/82, pulse 60, height '5\' 6"'$  (1.676 m), weight 171 lb  12.8 oz (77.9 kg), last menstrual period 04/27/2021, SpO2 100 %.Body mass index is 27.73 kg/m.  General Appearance: Casual and Fairly Groomed  Eye Contact:  Good  Speech:  Clear and Coherent and Normal Rate  Volume:  Normal  Mood:  Euthymic  Affect:  Appropriate and Congruent  Thought Process:  Coherent and Goal Directed  Orientation:  Full (Time, Place, and Person)  Thought Content: WDL and Logical   Suicidal Thoughts:  No  Homicidal Thoughts:  No  Memory:  Immediate;   Good Recent;   Good  Judgement:  Good  Insight:  Good  Psychomotor Activity:  Normal  Concentration:  Concentration: Good and Attention Span: Good  Recall:  Good  Fund of Knowledge: Good  Language: Good  Akathisia:  Negative  Handed:  Right  AIMS (if indicated): not done  Assets:  Communication Skills Desire for Improvement Resilience Vocational/Educational  ADL's:  Intact  Cognition: WNL  Sleep:  Fair   Screenings: GAD-7    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 [facility] Office Visit from 11/11/2020 in [place] for [place] at [facility]  Total GAD-7 Score 14 15      PHQ2-9    [place] Visit from 06/29/2021 in [place] at [place] Visit from 02/17/2021 in [place] at [facility] Visit from 11/11/2020 in [place] [facility] [place] [facility]  PHQ-2 Total Score '2 2 2  '$ PHQ-9 Total Score '4 11 10      '$ Flowsheet Row Counselor from 07/06/2021 in [facility] ED to Hosp-Admission (Discharged) from 05/19/2021 in [facility] Admission (Discharged) from 05/18/2021 in [place] No Risk No Risk No Risk        Assessment and Plan:   Crystal Vasquez is a 51 yr old female who presents for follow-up and medication management.  PPHx is significant for Depression, Anxiety, PTSD and possible ADHD, no  history of Suicide Attempts, Self Injurious Behavior, or hospitalizations    Delaynee has been doing well with her medications at their current doses.  Therefore, we will not make any changes to her medications at this time.  She will return for follow-up in approximately 2 months.   MDD, Recurrent, Moderate  GAD  PTSD: -Continue Zoloft 100 mg daily for depression and anxiety. 30 tablets with 1 refill -Continue Seroquel 50 mg QHS for augmentation, mood stability, and insomnia.  30 tablets with 1 refill.    Collaboration of Care:   Patient/Guardian was advised Release of Information must be obtained prior to any record release in order to collaborate their care with an outside provider. Patient/Guardian was advised if they have not already done so to contact the registration department to sign all necessary forms in order for [place] to release information regarding their care.   Consent: Patient/Guardian gives verbal consent for treatment and assignment of benefits for services provided during this visit. Patient/Guardian expressed understanding and agreed to proceed.    Briant Cedar, MD 03/03/2022, 8:30 AM

## 2022-03-27 ENCOUNTER — Other Ambulatory Visit: Payer: Self-pay

## 2022-03-27 ENCOUNTER — Encounter: Payer: Self-pay | Admitting: Hematology and Oncology

## 2022-04-03 ENCOUNTER — Other Ambulatory Visit: Payer: Self-pay

## 2022-04-04 ENCOUNTER — Other Ambulatory Visit: Payer: Self-pay

## 2022-04-07 ENCOUNTER — Ambulatory Visit: Payer: Self-pay | Attending: Internal Medicine | Admitting: Internal Medicine

## 2022-04-11 ENCOUNTER — Other Ambulatory Visit: Payer: Self-pay

## 2022-05-05 ENCOUNTER — Ambulatory Visit (INDEPENDENT_AMBULATORY_CARE_PROVIDER_SITE_OTHER): Payer: No Payment, Other | Admitting: Student in an Organized Health Care Education/Training Program

## 2022-05-05 ENCOUNTER — Other Ambulatory Visit: Payer: Self-pay

## 2022-05-05 ENCOUNTER — Encounter (HOSPITAL_COMMUNITY): Payer: Self-pay | Admitting: Student in an Organized Health Care Education/Training Program

## 2022-05-05 ENCOUNTER — Encounter: Payer: Self-pay | Admitting: Hematology and Oncology

## 2022-05-05 VITALS — BP 146/93 | HR 54 | Ht 66.0 in | Wt 172.2 lb

## 2022-05-05 DIAGNOSIS — F4310 Post-traumatic stress disorder, unspecified: Secondary | ICD-10-CM | POA: Diagnosis not present

## 2022-05-05 DIAGNOSIS — F331 Major depressive disorder, recurrent, moderate: Secondary | ICD-10-CM | POA: Diagnosis not present

## 2022-05-05 DIAGNOSIS — F411 Generalized anxiety disorder: Secondary | ICD-10-CM | POA: Diagnosis not present

## 2022-05-05 MED ORDER — QUETIAPINE FUMARATE 50 MG PO TABS
50.0000 mg | ORAL_TABLET | Freq: Every day | ORAL | 2 refills | Status: DC
  Filled 2022-05-05: qty 30, 30d supply, fill #0
  Filled 2022-06-06: qty 30, 30d supply, fill #1

## 2022-05-05 MED ORDER — SERTRALINE HCL 100 MG PO TABS
100.0000 mg | ORAL_TABLET | Freq: Every day | ORAL | 2 refills | Status: DC
  Filled 2022-05-05: qty 30, 30d supply, fill #0
  Filled 2022-06-06: qty 30, 30d supply, fill #1

## 2022-05-05 NOTE — Progress Notes (Signed)
[facility] MD/PA/NP OP Progress Note  05/05/2022 2:17 PM Crystal Vasquez  MRN:  [id number]  Chief Complaint:  Chief Complaint  Patient presents with   Follow-up   Depression   Anxiety   HPI:  Crystal Vasquez is a 52 yr old female who presents for Follow Up and Medication Management.  PPHx is significant for Depression, Anxiety, PTSD and possible ADHD, no history of Suicide Attempts, Self Injurious Behavior, or Psychiatric Hospitalizations.    She reports that things have been going okay.  She reports that her medicines have been doing a good job of controlling her depression and anxiety.  She reports that she continues to have some issues with people getting on her nerves but that she is able to control herself during these times.  She reports she has noticed that her car has become more cluttered over the past few weeks.  She reports that she feels like this is because she had stopped making a list of things to do so she has trouble remembering things.  She reports she will start doing this again and feels like this is a small situational "funk."  Discussed if she felt like any medication changes need to be made she reports she thinks she is doing good.  She reports no SI, HI, or AVH.  She reports her sleep has been ok recently but again believes that this is just temporary.  She reports her appetite has been fair.  She reports no other concerns at present.  Visit Diagnosis:    ICD-10-CM   1. MDD (major depressive disorder), recurrent episode, moderate (HCC)  F33.1 sertraline (ZOLOFT) 100 MG tablet    QUEtiapine (SEROQUEL) 50 MG tablet    2. Generalized anxiety disorder  F41.1 sertraline (ZOLOFT) 100 MG tablet    QUEtiapine (SEROQUEL) 50 MG tablet    3. PTSD (post-traumatic stress disorder)  F43.10 sertraline (ZOLOFT) 100 MG tablet    QUEtiapine (SEROQUEL) 50 MG tablet      Past Psychiatric History: Depression, Anxiety, PTSD and possible ADHD, no history of Suicide Attempts, Self  Injurious Behavior, or Psychiatric Hospitalizations.  Past Medical History:  Past Medical History:  Diagnosis Date   Anemia    Asthma    due to acid reflux   Depression    DM2    Dyspnea    GERD (gastroesophageal reflux disease)    Headache    Heart murmur    benign   Hypertension    Obesity (BMI 30-39.9)    Thyroid disease     Past Surgical History:  Procedure Laterality Date   ABDOMINAL HYSTERECTOMY     BIOPSY THYROID     IR ANGIOGRAM PELVIS SELECTIVE OR SUPRASELECTIVE  05/09/2021   IR ANGIOGRAM SELECTIVE EACH ADDITIONAL VESSEL  05/09/2021   IR AORTAGRAM ABDOMINAL SERIALOGRAM  05/09/2021   IR EMBO TUMOR ORGAN ISCHEMIA INFARCT INC GUIDE ROADMAPPING  05/09/2021   IR RADIOLOGIST EVAL & MGMT  04/11/2021   IR US GUIDE VASC ACCESS LEFT  05/09/2021   ROBOTIC ASSISTED LAPAROSCOPIC HYSTERECTOMY AND SALPINGECTOMY Bilateral 05/18/2021   Procedure: XI ROBOTIC ASSISTED LAPAROSCOPIC HYSTERECTOMY AND SALPINGECTOMY, UTERUS MORE THAN 250 GRAMS;  Surgeon: Lafonda Mosses, MD;  Location: [facility];  Service: Gynecology;  Laterality: Bilateral;    Family Psychiatric History: Mother- Bipolar Disorder, Suicide 34 (OD) Sister- Schizophrenia, Suicide 2010 (OD) Brother- Bipolar Disorder, EtOH Abuse, Polysubstance Abuse  Family History:  Family History  Problem Relation Age of Onset   Diabetes Mother    Hypertension Mother  Diabetes Father    Hypertension Father    Endometrial cancer Maternal Aunt    Colon cancer Neg Hx    Breast cancer Neg Hx    Ovarian cancer Neg Hx    Pancreatic cancer Neg Hx    Prostate cancer Neg Hx     Social History:  Social History   Socioeconomic History   Marital status: Single    Spouse name: Not on file   Number of children: Not on file   Years of education: Not on file   Highest education level: Not on file  Occupational History   Not on file  Tobacco Use   Smoking status: Never    Passive exposure: Never   Smokeless tobacco: Never  Vaping  Use   Vaping Use: Never used  Substance and Sexual Activity   Alcohol use: Not Currently   Drug use: Never   Sexual activity: Not Currently  Other Topics Concern   Not on file  Social History Narrative   Not on file   Social Determinants of Health   Financial Resource Strain: High Risk (10/12/2021)   Overall Financial Resource Strain (CARDIA)    Difficulty of Paying Living Expenses: Very hard  Food Insecurity: Food Insecurity Present (10/12/2021)   Hunger Vital Sign    Worried About Running Out of Food in the Last Year: Often true    Ran Out of Food in the Last Year: Often true  Transportation Needs: Unmet Transportation Needs (10/12/2021)   PRAPARE - Hydrologist (Medical): Yes    Lack of Transportation (Non-Medical): Yes  Physical Activity: Not on file  Stress: Not on file  Social Connections: Not on file    Allergies: No Known Allergies  Metabolic Disorder Labs: Lab Results  Component Value Date   HGBA1C 7.8 (A) 01/24/2022   No results found for: "PROLACTIN" No results found for: "CHOL", "TRIG", "HDL", "CHOLHDL", "VLDL", "LDLCALC" Lab Results  Component Value Date   TSH 0.21 (L) 02/20/2022   TSH 0.134 (L) 06/29/2021    Therapeutic Level Labs: No results found for: "LITHIUM" No results found for: "VALPROATE" No results found for: "CBMZ"  Current Medications: Current Outpatient Medications  Medication Sig Dispense Refill   ACETAMINOPHEN EXTRA STRENGTH 500 MG capsule SMARTSIG:2 Capsule(s) By Mouth Daily PRN     amLODipine (NORVASC) 10 MG tablet Take 1 tablet (10 mg total) by mouth daily. 90 tablet 1   bisoprolol (ZEBETA) 10 MG tablet Take 1 tablet (10 mg total) by mouth daily. 90 tablet 1   ferrous sulfate 325 (65 FE) MG tablet Take 325 mg by mouth in the morning.     fluticasone (VERAMYST) 27.5 MCG/SPRAY nasal spray Place 1 spray into the nose in the morning.     gabapentin (NEURONTIN) 300 MG capsule Take 1 capsule (300 mg total) by  mouth at bedtime. 90 capsule 1   glipiZIDE (GLUCOTROL) 10 MG tablet Take 1 tablet (10 mg total) by mouth 2 (two) times daily before a meal. 180 tablet 2   hydrALAZINE (APRESOLINE) 25 MG tablet Take 2 tablets by mouth in morning and evening and 1 tablet in afternoon 150 tablet 6   hydrochlorothiazide (HYDRODIURIL) 25 MG tablet Take 1 tablet (25 mg total) by mouth daily. 90 tablet 1   hydrocortisone 1 % ointment Apply 1 Application topically 2 (two) times daily. 28 g 0   LASIX 20 MG tablet Take 20 mg by mouth daily as needed.     meloxicam (MOBIC)  15 MG tablet Take 1 tablet (15 mg total) by mouth daily as needed. 30 tablet 2   metFORMIN (GLUCOPHAGE) 1000 MG tablet Take 1 tablet (1,000 mg total) by mouth 2 (two) times daily with a meal. 180 tablet 2   olmesartan (BENICAR) 40 MG tablet Take 1 tablet (40 mg total) by mouth daily. 90 tablet 3   omeprazole (PRILOSEC) 20 MG capsule Take 20 mg by mouth daily.     QUEtiapine (SEROQUEL) 50 MG tablet Take 1 tablet (50 mg total) by mouth at bedtime. 30 tablet 2   sertraline (ZOLOFT) 100 MG tablet Take 1 tablet (100 mg total) by mouth daily. 30 tablet 2   simvastatin (ZOCOR) 20 MG tablet Take 1 tablet (20 mg total) by mouth at bedtime. 30 tablet 6   No current facility-administered medications for this visit.     Musculoskeletal: Strength & Muscle Tone: within normal limits Gait & Station: normal Patient leans: N/A  Psychiatric Specialty Exam: Review of Systems  Respiratory:  Negative for shortness of breath.   Cardiovascular:  Negative for chest pain.  Gastrointestinal:  Negative for abdominal pain, constipation, diarrhea, nausea and vomiting.  Neurological:  Negative for dizziness, weakness and headaches.  Psychiatric/Behavioral:  Positive for sleep disturbance. Negative for dysphoric mood, hallucinations and suicidal ideas. The patient is nervous/anxious.     Blood pressure (!) 146/93, pulse (!) 54, height 5' 6"$  (1.676 m), weight 172 lb 3.2 oz  (78.1 kg), last menstrual period 04/27/2021, SpO2 100 %.Body mass index is 27.79 kg/m.  General Appearance: Casual and Fairly Groomed  Eye Contact:  Good  Speech:  Clear and Coherent and Normal Rate  Volume:  Normal  Mood:   "ok"  Affect:  Appropriate and Congruent  Thought Process:  Coherent and Goal Directed  Orientation:  Full (Time, Place, and Person)  Thought Content: WDL and Logical   Suicidal Thoughts:  No  Homicidal Thoughts:  No  Memory:  Immediate;   Good Recent;   Good  Judgement:  Good  Insight:  Good  Psychomotor Activity:  Normal  Concentration:  Concentration: Good and Attention Span: Good  Recall:  Good  Fund of Knowledge: Good  Language: Good  Akathisia:  Negative  Handed:  Right  AIMS (if indicated): done AIMS= 0  Assets:  Communication Skills Desire for Improvement Resilience Vocational/Educational  ADL's:  Intact  Cognition: WNL  Sleep:  Fair   Screenings: GAD-7    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 [facility] Office Visit from 11/11/2020 in [place] for [place] at [facility]  Total GAD-7 Score 14 15      PHQ2-9    [place] Visit from 06/29/2021 in [place] at [place] Visit from 02/17/2021 in [place] at [place] Visit from 11/11/2020 in [place] for [place] at [facility]  PHQ-2 Total Score 2 2 2  $ PHQ-9 Total Score 4 11 10      $ Flowsheet Row Counselor from 07/06/2021 in [facility] ED to Hosp-Admission (Discharged) from 05/19/2021 in [facility] Admission (Discharged) from 05/18/2021 in [place] No Risk No Risk No Risk        Assessment and Plan:  Crystal Vasquez is a 52 yr old female who presents for Follow Up and Medication Management.  PPHx is significant for Depression, Anxiety, PTSD and  possible  ADHD, no history of Suicide Attempts, Self Injurious Behavior, or Psychiatric Hospitalizations.   Sudiksha has been doing relatively well since her last appointment.  She has had some worsening of her symptoms but thinks it is a transient thing.  We will not make any changes to her medications at this time.  She will return for follow up in approximately 2 months.   MDD, Recurrent, Moderate  GAD  PTSD: -Continue Zoloft 100 mg daily for depression and anxiety. 30 tablets with 1 refill -Continue Seroquel 50 mg QHS for augmentation, mood stability, and insomnia.  30 tablets with 1 refill.    Collaboration of Care:   Patient/Guardian was advised Release of Information must be obtained prior to any record release in order to collaborate their care with an outside provider. Patient/Guardian was advised if they have not already done so to contact the registration department to sign all necessary forms in order for [place] to release information regarding their care.   Consent: Patient/Guardian gives verbal consent for treatment and assignment of benefits for services provided during this visit. Patient/Guardian expressed understanding and agreed to proceed.    Briant Cedar, MD 05/05/2022, 2:17 PM

## 2022-05-09 ENCOUNTER — Encounter: Payer: Self-pay | Admitting: Hematology and Oncology

## 2022-05-25 ENCOUNTER — Ambulatory Visit: Payer: Self-pay | Admitting: Internal Medicine

## 2022-06-04 ENCOUNTER — Telehealth: Payer: Self-pay | Admitting: Physician Assistant

## 2022-06-04 DIAGNOSIS — J208 Acute bronchitis due to other specified organisms: Secondary | ICD-10-CM

## 2022-06-04 DIAGNOSIS — B9689 Other specified bacterial agents as the cause of diseases classified elsewhere: Secondary | ICD-10-CM

## 2022-06-04 MED ORDER — PROMETHAZINE-DM 6.25-15 MG/5ML PO SYRP
5.0000 mL | ORAL_SOLUTION | Freq: Four times a day (QID) | ORAL | 0 refills | Status: DC | PRN
  Filled 2022-06-04: qty 118, 6d supply, fill #0

## 2022-06-04 MED ORDER — AZITHROMYCIN 250 MG PO TABS
ORAL_TABLET | ORAL | 0 refills | Status: AC
  Filled 2022-06-04: qty 6, 5d supply, fill #0

## 2022-06-04 NOTE — Patient Instructions (Signed)
Crystal Vasquez, thank you for joining Mar Daring, PA-C for today's virtual visit.  While this provider is not your primary care provider (PCP), if your PCP is located in our provider database this encounter information will be shared with them immediately following your visit.   [place] account gives you access to today's visit and all your visits, tests, and labs performed at [facility] " click here if you don't have a [place] account or go to [email or web address]  Consent: (Patient) Crystal Vasquez provided verbal consent for this virtual visit at the beginning of the encounter.  Current Medications:  Current Outpatient Medications:    azithromycin (ZITHROMAX) 250 MG tablet, Take 2 tablets on day 1, then 1 tablet daily on days 2 through 5, Disp: 6 tablet, Rfl: 0   promethazine-dextromethorphan (PROMETHAZINE-DM) 6.25-15 MG/5ML syrup, Take 5 mLs by mouth 4 (four) times daily as needed., Disp: 118 mL, Rfl: 0   ACETAMINOPHEN EXTRA STRENGTH 500 MG capsule, SMARTSIG:2 Capsule(s) By Mouth Daily PRN, Disp: , Rfl:    amLODipine (NORVASC) 10 MG tablet, Take 1 tablet (10 mg total) by mouth daily., Disp: 90 tablet, Rfl: 1   bisoprolol (ZEBETA) 10 MG tablet, Take 1 tablet (10 mg total) by mouth daily., Disp: 90 tablet, Rfl: 1   ferrous sulfate 325 (65 FE) MG tablet, Take 325 mg by mouth in the morning., Disp: , Rfl:    fluticasone (VERAMYST) 27.5 MCG/SPRAY nasal spray, Place 1 spray into the nose in the morning., Disp: , Rfl:    gabapentin (NEURONTIN) 300 MG capsule, Take 1 capsule (300 mg total) by mouth at bedtime., Disp: 90 capsule, Rfl: 1   glipiZIDE (GLUCOTROL) 10 MG tablet, Take 1 tablet (10 mg total) by mouth 2 (two) times daily before a meal., Disp: 180 tablet, Rfl: 2   hydrALAZINE (APRESOLINE) 25 MG tablet, Take 2 tablets by mouth in morning and evening and 1 tablet in afternoon, Disp: 150 tablet, Rfl: 6   hydrochlorothiazide  (HYDRODIURIL) 25 MG tablet, Take 1 tablet (25 mg total) by mouth daily., Disp: 90 tablet, Rfl: 1   hydrocortisone 1 % ointment, Apply 1 Application topically 2 (two) times daily., Disp: 28 g, Rfl: 0   LASIX 20 MG tablet, Take 20 mg by mouth daily as needed., Disp: , Rfl:    meloxicam (MOBIC) 15 MG tablet, Take 1 tablet (15 mg total) by mouth daily as needed., Disp: 30 tablet, Rfl: 2   metFORMIN (GLUCOPHAGE) 1000 MG tablet, Take 1 tablet (1,000 mg total) by mouth 2 (two) times daily with a meal., Disp: 180 tablet, Rfl: 2   olmesartan (BENICAR) 40 MG tablet, Take 1 tablet (40 mg total) by mouth daily., Disp: 90 tablet, Rfl: 3   omeprazole (PRILOSEC) 20 MG capsule, Take 20 mg by mouth daily., Disp: , Rfl:    QUEtiapine (SEROQUEL) 50 MG tablet, Take 1 tablet (50 mg total) by mouth at bedtime., Disp: 30 tablet, Rfl: 2   sertraline (ZOLOFT) 100 MG tablet, Take 1 tablet (100 mg total) by mouth daily., Disp: 30 tablet, Rfl: 2   simvastatin (ZOCOR) 20 MG tablet, Take 1 tablet (20 mg total) by mouth at bedtime., Disp: 30 tablet, Rfl: 6   Medications ordered in this encounter:  Meds ordered this encounter  Medications   azithromycin (ZITHROMAX) 250 MG tablet    Sig: Take 2 tablets on day 1, then 1 tablet daily on days 2 through 5    Dispense:  6 tablet  Refill:  0    Order Specific Question:   Supervising Provider    Answer:   Chase Picket [id number]   promethazine-dextromethorphan (PROMETHAZINE-DM) 6.25-15 MG/5ML syrup    Sig: Take 5 mLs by mouth 4 (four) times daily as needed.    Dispense:  118 mL    Refill:  0    Order Specific Question:   Supervising Provider    Answer:   Chase Picket [id number]     *If you need refills on other medications prior to your next appointment, please contact your pharmacy*  Follow-Up: Call back or seek an in-person evaluation if the symptoms worsen or if the condition fails to improve as anticipated.  [place] [phone]  Other  Instructions  Acute Bronchitis, Adult  Acute bronchitis is sudden inflammation of the main airways (bronchi) that come off the windpipe (trachea) in the lungs. The swelling causes the airways to get smaller and make more mucus than normal. This can make it hard to breathe and can cause coughing or noisy breathing (wheezing). Acute bronchitis may last several weeks. The cough may last longer. Allergies, asthma, and exposure to smoke may make the condition worse. What are the causes? This condition can be caused by germs and by substances that irritate the lungs, including: Cold and flu viruses. The most common cause of this condition is the virus that causes the common cold. Bacteria. This is less common. Breathing in substances that irritate the lungs, including: Smoke from cigarettes and other forms of tobacco. Dust and pollen. Fumes from household cleaning products, gases, or burned fuel. Indoor or outdoor air pollution. What increases the risk? The following factors may make you more likely to develop this condition: A weak body's defense system, also called the immune system. A condition that affects your lungs and breathing, such as asthma. What are the signs or symptoms? Common symptoms of this condition include: Coughing. This may bring up clear, yellow, or green mucus from your lungs (sputum). Wheezing. Runny or stuffy nose. Having too much mucus in your lungs (chest congestion). Shortness of breath. Aches and pains, including sore throat or chest. How is this diagnosed? This condition is usually diagnosed based on: Your symptoms and medical history. A physical exam. You may also have other tests, including tests to rule out other conditions, such as pneumonia. These tests include: A test of lung function. Test of a mucus sample to look for the presence of bacteria. Tests to check the oxygen level in your blood. Blood tests. Chest X-ray. How is this treated? Most cases  of acute bronchitis clear up over time without treatment. Your health care provider may recommend: Drinking more fluids to help thin your mucus so it is easier to cough up. Taking inhaled medicine (inhaler) to improve air flow in and out of your lungs. Using a vaporizer or a humidifier. These are machines that add water to the air to help you breathe better. Taking a medicine that thins mucus and clears congestion (expectorant). Taking a medicine that prevents or stops coughing (cough suppressant). It is not common to take an antibiotic medicine for this condition. Follow these instructions at home:  Take over-the-counter and prescription medicines only as told by your health care provider. Use an inhaler, vaporizer, or humidifier as told by your health care provider. Take two teaspoons (10 mL) of honey at bedtime to lessen coughing at night. Drink enough fluid to keep your urine pale yellow. Do not use any  products that contain nicotine or tobacco. These products include cigarettes, chewing tobacco, and vaping devices, such as e-cigarettes. If you need help quitting, ask your health care provider. Get plenty of rest. Return to your normal activities as told by your health care provider. Ask your health care provider what activities are safe for you. Keep all follow-up visits. This is important. How is this prevented? To lower your risk of getting this condition again: Wash your hands often with soap and water for at least 20 seconds. If soap and water are not available, use hand sanitizer. Avoid contact with people who have cold symptoms. Try not to touch your mouth, nose, or eyes with your hands. Avoid breathing in smoke or chemical fumes. Breathing smoke or chemical fumes will make your condition worse. Get the flu shot every year. Contact a health care provider if: Your symptoms do not improve after 2 weeks. You have trouble coughing up the mucus. Your cough keeps you awake at  night. You have a fever. Get help right away if you: Cough up blood. Feel pain in your chest. Have severe shortness of breath. Faint or keep feeling like you are going to faint. Have a severe headache. Have a fever or chills that get worse. These symptoms may represent a serious problem that is an emergency. Do not wait to see if the symptoms will go away. Get medical help right away. Call your local emergency services (911 in the [place] Do not drive yourself to the hospital. Summary Acute bronchitis is inflammation of the main airways (bronchi) that come off the windpipe (trachea) in the lungs. The swelling causes the airways to get smaller and make more mucus than normal. Drinking more fluids can help thin your mucus so it is easier to cough up. Take over-the-counter and prescription medicines only as told by your health care provider. Do not use any products that contain nicotine or tobacco. These products include cigarettes, chewing tobacco, and vaping devices, such as e-cigarettes. If you need help quitting, ask your health care provider. Contact a health care provider if your symptoms do not improve after 2 weeks. This information is not intended to replace advice given to you by your health care provider. Make sure you discuss any questions you have with your health care provider. Document Revised: 06/16/2021 Document Reviewed: 07/07/2020 Elsevier Patient Education  [employer].    If you have been instructed to have an in-person evaluation today at a local Urgent Care facility, please use the link below. It will take you to a list of all of our available [facility] [facility], including address, phone number and hours of operation. Please do not delay care.  [place] [place] [facility]  If you or a family member do not have a primary care provider, use the link below to schedule a visit and establish care. When you choose a [place] primary care physician or advanced  practice provider, you gain a long-term partner in health. Find a Primary Care Provider  Learn more about [facility]'s in-office and virtual care options: [facility]

## 2022-06-04 NOTE — Progress Notes (Signed)
Virtual Visit Consent   [name], you are scheduled for a virtual visit with a [name] today. Just as with appointments in the office, your consent must be obtained to participate. Your consent will be active for this visit and any virtual visit you may have with one of our providers in the next 365 days. If you have a MyChart account, a copy of this consent can be sent to you electronically.  As this is a virtual visit, video technology does not allow for your provider to perform a traditional examination. This may limit your provider's ability to fully assess your condition. If your provider identifies any concerns that need to be evaluated in person or the need to arrange testing (such as labs, EKG, etc.), we will make arrangements to do so. Although advances in technology are sophisticated, we cannot ensure that it will always work on either your end or our end. If the connection with a video visit is poor, the visit may have to be switched to a telephone visit. With either a video or telephone visit, we are not always able to ensure that we have a secure connection.  By engaging in this virtual visit, you consent to the provision of healthcare and authorize for your insurance to be billed (if applicable) for the services provided during this visit. Depending on your insurance coverage, you may receive a charge related to this service.  I need to obtain your verbal consent now. Are you willing to proceed with your visit today? [name] has provided verbal consent on 06/04/2022 for a virtual visit (video or telephone). Mar Daring, PA-C  Date: 06/04/2022 6:20 PM  Virtual Visit via Video Note   I, Mar Daring, connected with  Crystal Vasquez  ([id number], Jan 07, 1971) on 06/04/22 at  6:15 PM EDT by a video-enabled telemedicine application and verified that I am speaking with the correct person using two identifiers.  Location: Patient: Virtual  Visit Location Patient: Mobile Provider: Virtual Visit Location Provider: Home Office   I discussed the limitations of evaluation and management by telemedicine and the availability of in person appointments. The patient expressed understanding and agreed to proceed.    History of Present Illness: Crystal Vasquez is a 52 y.o. who identifies as a female who was assigned female at birth, and is being seen today for cough and URI symptoms.  HPI: Cough This is a new problem. The current episode started in the past 7 days. The problem has been gradually worsening. The problem occurs every few minutes. The cough is Productive of sputum and productive of purulent sputum. Associated symptoms include chills, ear congestion, headaches, myalgias, nasal congestion, postnasal drip, rhinorrhea and a sore throat. Pertinent negatives include no ear pain, fever or wheezing. Associated symptoms comments: Hoarse voice. The symptoms are aggravated by lying down. Treatments tried: advil, mucinex. The treatment provided no relief. Her past medical history is significant for asthma (secondary to GERD).     Problems:  Patient Active Problem List   Diagnosis Date Noted   Subclinical hyperthyroidism 02/20/2022   Hyperlipidemia associated with type 2 diabetes mellitus ([place]) 01/24/2022   Excessive daytime sleepiness 10/31/2021   Snoring 10/31/2021   Fever postop 05/19/2021   Uterine polyp 05/09/2021   Hypertension associated with type 2 diabetes mellitus ([place]) 12/03/2020   Uncontrolled type 2 diabetes mellitus with hyperglycemia ([place]) 12/03/2020   Uterine mass 12/03/2020   Abnormal uterine bleeding (AUB) 11/11/2020   History of uterine  fibroid 11/11/2020   Iron deficiency anemia due to chronic blood loss 11/11/2020   History of menorrhagia 11/11/2020    Allergies: No Known Allergies Medications:  Current Outpatient Medications:    azithromycin (ZITHROMAX) 250 MG tablet, Take 2 tablets on day 1, then 1  tablet daily on days 2 through 5, Disp: 6 tablet, Rfl: 0   promethazine-dextromethorphan (PROMETHAZINE-DM) 6.25-15 MG/5ML syrup, Take 5 mLs by mouth 4 (four) times daily as needed., Disp: 118 mL, Rfl: 0   ACETAMINOPHEN EXTRA STRENGTH 500 MG capsule, SMARTSIG:2 Capsule(s) By Mouth Daily PRN, Disp: , Rfl:    amLODipine (NORVASC) 10 MG tablet, Take 1 tablet (10 mg total) by mouth daily., Disp: 90 tablet, Rfl: 1   bisoprolol (ZEBETA) 10 MG tablet, Take 1 tablet (10 mg total) by mouth daily., Disp: 90 tablet, Rfl: 1   ferrous sulfate 325 (65 FE) MG tablet, Take 325 mg by mouth in the morning., Disp: , Rfl:    fluticasone (VERAMYST) 27.5 MCG/SPRAY nasal spray, Place 1 spray into the nose in the morning., Disp: , Rfl:    gabapentin (NEURONTIN) 300 MG capsule, Take 1 capsule (300 mg total) by mouth at bedtime., Disp: 90 capsule, Rfl: 1   glipiZIDE (GLUCOTROL) 10 MG tablet, Take 1 tablet (10 mg total) by mouth 2 (two) times daily before a meal., Disp: 180 tablet, Rfl: 2   hydrALAZINE (APRESOLINE) 25 MG tablet, Take 2 tablets by mouth in morning and evening and 1 tablet in afternoon, Disp: 150 tablet, Rfl: 6   hydrochlorothiazide (HYDRODIURIL) 25 MG tablet, Take 1 tablet (25 mg total) by mouth daily., Disp: 90 tablet, Rfl: 1   hydrocortisone 1 % ointment, Apply 1 Application topically 2 (two) times daily., Disp: 28 g, Rfl: 0   LASIX 20 MG tablet, Take 20 mg by mouth daily as needed., Disp: , Rfl:    meloxicam (MOBIC) 15 MG tablet, Take 1 tablet (15 mg total) by mouth daily as needed., Disp: 30 tablet, Rfl: 2   metFORMIN (GLUCOPHAGE) 1000 MG tablet, Take 1 tablet (1,000 mg total) by mouth 2 (two) times daily with a meal., Disp: 180 tablet, Rfl: 2   olmesartan (BENICAR) 40 MG tablet, Take 1 tablet (40 mg total) by mouth daily., Disp: 90 tablet, Rfl: 3   omeprazole (PRILOSEC) 20 MG capsule, Take 20 mg by mouth daily., Disp: , Rfl:    QUEtiapine (SEROQUEL) 50 MG tablet, Take 1 tablet (50 mg total) by mouth at  bedtime., Disp: 30 tablet, Rfl: 2   sertraline (ZOLOFT) 100 MG tablet, Take 1 tablet (100 mg total) by mouth daily., Disp: 30 tablet, Rfl: 2   simvastatin (ZOCOR) 20 MG tablet, Take 1 tablet (20 mg total) by mouth at bedtime., Disp: 30 tablet, Rfl: 6  Observations/Objective: Patient is well-developed, well-nourished in no acute distress.  Resting comfortably  Head is normocephalic, atraumatic.  No labored breathing.  Speech is clear and coherent with logical content.  Patient is alert and oriented at baseline.    Assessment and Plan: 1. Acute bacterial bronchitis - azithromycin (ZITHROMAX) 250 MG tablet; Take 2 tablets on day 1, then 1 tablet daily on days 2 through 5  Dispense: 6 tablet; Refill: 0 - promethazine-dextromethorphan (PROMETHAZINE-DM) 6.25-15 MG/5ML syrup; Take 5 mLs by mouth 4 (four) times daily as needed.  Dispense: 118 mL; Refill: 0  - Worsening over a week despite OTC medications - Will treat with Z-pack and tessalon perles - Can continue Mucinex  - Push fluids.  - Rest.  -  Steam and humidifier can help - Seek in person evaluation if worsening or symptoms fail to improve    Follow Up Instructions: I discussed the assessment and treatment plan with the patient. The patient was provided an opportunity to ask questions and all were answered. The patient agreed with the plan and demonstrated an understanding of the instructions.  A copy of instructions were sent to the patient via MyChart unless otherwise noted below.    The patient was advised to call back or seek an in-person evaluation if the symptoms worsen or if the condition fails to improve as anticipated.  Time:  I spent 10 minutes with the patient via telehealth technology discussing the above problems/concerns.    Mar Daring, PA-C

## 2022-06-05 ENCOUNTER — Other Ambulatory Visit: Payer: Self-pay

## 2022-06-05 ENCOUNTER — Encounter: Payer: Self-pay | Admitting: Hematology and Oncology

## 2022-06-06 ENCOUNTER — Encounter: Payer: Self-pay | Admitting: Hematology and Oncology

## 2022-06-06 ENCOUNTER — Other Ambulatory Visit: Payer: Self-pay

## 2022-06-12 ENCOUNTER — Other Ambulatory Visit: Payer: Self-pay

## 2022-07-07 ENCOUNTER — Ambulatory Visit (INDEPENDENT_AMBULATORY_CARE_PROVIDER_SITE_OTHER): Payer: No Payment, Other | Admitting: Student in an Organized Health Care Education/Training Program

## 2022-07-07 ENCOUNTER — Encounter: Payer: Self-pay | Admitting: Hematology and Oncology

## 2022-07-07 ENCOUNTER — Other Ambulatory Visit: Payer: Self-pay

## 2022-07-07 DIAGNOSIS — F331 Major depressive disorder, recurrent, moderate: Secondary | ICD-10-CM | POA: Diagnosis not present

## 2022-07-07 DIAGNOSIS — F4310 Post-traumatic stress disorder, unspecified: Secondary | ICD-10-CM | POA: Diagnosis not present

## 2022-07-07 DIAGNOSIS — F411 Generalized anxiety disorder: Secondary | ICD-10-CM

## 2022-07-07 MED ORDER — QUETIAPINE FUMARATE 50 MG PO TABS
50.0000 mg | ORAL_TABLET | Freq: Every day | ORAL | 2 refills | Status: DC
  Filled 2022-07-07 – 2022-07-14 (×2): qty 30, 30d supply, fill #0
  Filled 2022-08-10 – 2022-08-22 (×2): qty 30, 30d supply, fill #1

## 2022-07-07 MED ORDER — SERTRALINE HCL 100 MG PO TABS
100.0000 mg | ORAL_TABLET | Freq: Every day | ORAL | 2 refills | Status: DC
  Filled 2022-07-07 – 2022-07-14 (×2): qty 30, 30d supply, fill #0
  Filled 2022-08-10 – 2022-08-22 (×2): qty 30, 30d supply, fill #1

## 2022-07-07 NOTE — Progress Notes (Signed)
BH MD/PA/NP OP Progress Note  07/07/2022 8:28 AM Crystal Vasquez  MRN:  [id number]  Chief Complaint:  Chief Complaint  Patient presents with   Follow-up   Depression   Anxiety   HPI:  Crystal Vasquez is a 52 yr old female who presents for Follow Up and Medication Management.  PPHx is significant for Depression, Anxiety, PTSD and possible ADHD, no history of Suicide Attempts, Self Injurious Behavior, or Psychiatric Hospitalizations.    Reports she has been doing okay since her last appointment.  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  She reports that she is working on this by changing her schedule around so that this will not be an issue anymore.  She reports she is started doing more hiking and going for walks which has been helpful.  Discussed with her that since overall she is doing better we would not make any medication changes today and she was agreeable with this.  Discussed her high blood pressure and the importance of addressing this with her PCP.  She reports that she does have an appointment with her PCP in the next few weeks and she will discuss that.  She reports no SI, HI, or AVH.  She reports her sleep is good.  She reports her appetite is doing good.  She reports no other concerns present.  She will return for follow-up in approximately 2 months.   Visit Diagnosis:    ICD-10-CM   1. Generalized anxiety disorder  F41.1 sertraline (ZOLOFT) 100 MG tablet    QUEtiapine (SEROQUEL) 50 MG tablet    2. PTSD (post-traumatic stress disorder)  F43.10 sertraline (ZOLOFT) 100 MG tablet    QUEtiapine (SEROQUEL) 50 MG tablet    3. MDD (major depressive disorder), recurrent episode, moderate  F33.1 sertraline (ZOLOFT) 100 MG tablet    QUEtiapine (SEROQUEL) 50 MG tablet      Past Psychiatric History: Depression, Anxiety, PTSD and possible ADHD, no history of Suicide Attempts, Self Injurious Behavior, or Psychiatric  Hospitalizations.    Past Medical History:  Past Medical History:  Diagnosis Date   Anemia    Asthma    due to acid reflux   Depression    DM2    Dyspnea    GERD (gastroesophageal reflux disease)    Headache    Heart murmur    benign   Hypertension    Obesity (BMI 30-39.9)    Thyroid disease     Past Surgical History:  Procedure Laterality Date   ABDOMINAL HYSTERECTOMY     BIOPSY THYROID     IR ANGIOGRAM PELVIS SELECTIVE OR SUPRASELECTIVE  05/09/2021   IR ANGIOGRAM SELECTIVE EACH ADDITIONAL VESSEL  05/09/2021   IR AORTAGRAM ABDOMINAL SERIALOGRAM  05/09/2021   IR EMBO TUMOR ORGAN ISCHEMIA INFARCT INC GUIDE ROADMAPPING  05/09/2021   IR RADIOLOGIST EVAL & MGMT  04/11/2021   IR US GUIDE VASC ACCESS LEFT  05/09/2021   ROBOTIC ASSISTED LAPAROSCOPIC HYSTERECTOMY AND SALPINGECTOMY Bilateral 05/18/2021   Procedure: XI ROBOTIC ASSISTED LAPAROSCOPIC HYSTERECTOMY AND SALPINGECTOMY, UTERUS MORE THAN 250 GRAMS;  Surgeon: Carver Fila, MD;  Location: [facility];  Service: Gynecology;  Laterality: Bilateral;    Family Psychiatric History: Mother- Bipolar Disorder, Suicide 46 (OD) Sister- Schizophrenia, Suicide 2010 (OD) Brother- Bipolar Disorder, EtOH Abuse, Polysubstance Abuse  Family History:  Family History  Problem Relation Age of Onset   Diabetes Mother    Hypertension Mother  Diabetes Father    Hypertension Father    Endometrial cancer Maternal Aunt    Colon cancer Neg Hx    Breast cancer Neg Hx    Ovarian cancer Neg Hx    Pancreatic cancer Neg Hx    Prostate cancer Neg Hx     Social History:  Social History   Socioeconomic History   Marital status: Single    Spouse name: Not on file   Number of children: Not on file   Years of education: Not on file   Highest education level: Not on file  Occupational History   Not on file  Tobacco Use   Smoking status: Never    Passive exposure: Never   Smokeless tobacco: Never  Vaping Use   Vaping Use: Never used   Substance and Sexual Activity   Alcohol use: Not Currently   Drug use: Never   Sexual activity: Not Currently  Other Topics Concern   Not on file  Social History Narrative   Not on file   Social Determinants of Health   Financial Resource Strain: High Risk (10/12/2021)   Overall Financial Resource Strain (CARDIA)    Difficulty of Paying Living Expenses: Very hard  Food Insecurity: Food Insecurity Present (10/12/2021)   Hunger Vital Sign    Worried About Running Out of Food in the Last Year: Often true    Ran Out of Food in the Last Year: Often true  Transportation Needs: Unmet Transportation Needs (10/12/2021)   PRAPARE - Administrator, Civil Service (Medical): Yes    Lack of Transportation (Non-Medical): Yes  Physical Activity: Not on file  Stress: Not on file  Social Connections: Not on file    Allergies: No Known Allergies  Metabolic Disorder Labs: Lab Results  Component Value Date   HGBA1C 7.8 (A) 01/24/2022   No results found for: "PROLACTIN" No results found for: "CHOL", "TRIG", "HDL", "CHOLHDL", "VLDL", "LDLCALC" Lab Results  Component Value Date   TSH 0.21 (L) 02/20/2022   TSH 0.134 (L) 06/29/2021    Therapeutic Level Labs: No results found for: "LITHIUM" No results found for: "VALPROATE" No results found for: "CBMZ"  Current Medications: Current Outpatient Medications  Medication Sig Dispense Refill   ACETAMINOPHEN EXTRA STRENGTH 500 MG capsule SMARTSIG:2 Capsule(s) By Mouth Daily PRN     amLODipine (NORVASC) 10 MG tablet Take 1 tablet (10 mg total) by mouth daily. 90 tablet 1   bisoprolol (ZEBETA) 10 MG tablet Take 1 tablet (10 mg total) by mouth daily. 90 tablet 1   ferrous sulfate 325 (65 FE) MG tablet Take 325 mg by mouth in the morning.     fluticasone (VERAMYST) 27.5 MCG/SPRAY nasal spray Place 1 spray into the nose in the morning.     gabapentin (NEURONTIN) 300 MG capsule Take 1 capsule (300 mg total) by mouth at bedtime. 90 capsule 1    glipiZIDE (GLUCOTROL) 10 MG tablet Take 1 tablet (10 mg total) by mouth 2 (two) times daily before a meal. 180 tablet 2   hydrALAZINE (APRESOLINE) 25 MG tablet Take 2 tablets by mouth in morning and evening and 1 tablet in afternoon 150 tablet 6   hydrochlorothiazide (HYDRODIURIL) 25 MG tablet Take 1 tablet (25 mg total) by mouth daily. 90 tablet 1   hydrocortisone 1 % ointment Apply 1 Application topically 2 (two) times daily. 28 g 0   LASIX 20 MG tablet Take 20 mg by mouth daily as needed.     meloxicam (MOBIC)  15 MG tablet Take 1 tablet (15 mg total) by mouth daily as needed. 30 tablet 2   metFORMIN (GLUCOPHAGE) 1000 MG tablet Take 1 tablet (1,000 mg total) by mouth 2 (two) times daily with a meal. 180 tablet 2   olmesartan (BENICAR) 40 MG tablet Take 1 tablet (40 mg total) by mouth daily. 90 tablet 3   omeprazole (PRILOSEC) 20 MG capsule Take 20 mg by mouth daily.     promethazine-dextromethorphan (PROMETHAZINE-DM) 6.25-15 MG/5ML syrup Take 5 mLs by mouth 4 (four) times daily as needed. 118 mL 0   QUEtiapine (SEROQUEL) 50 MG tablet Take 1 tablet (50 mg total) by mouth at bedtime. 30 tablet 2   sertraline (ZOLOFT) 100 MG tablet Take 1 tablet (100 mg total) by mouth daily. 30 tablet 2   simvastatin (ZOCOR) 20 MG tablet Take 1 tablet (20 mg total) by mouth at bedtime. 30 tablet 6   No current facility-administered medications for this visit.     Musculoskeletal: Strength & Muscle Tone: within normal limits Gait & Station: normal Patient leans: N/A  Psychiatric Specialty Exam: Review of Systems  Respiratory:  Negative for shortness of breath.   Cardiovascular:  Negative for chest pain.  Gastrointestinal:  Negative for abdominal pain, constipation, diarrhea, nausea and vomiting.  Neurological:  Negative for dizziness, weakness and headaches.  Psychiatric/Behavioral:  Positive for dysphoric mood. Negative for hallucinations, sleep disturbance and suicidal ideas. The patient is  nervous/anxious.     Blood pressure (!) 167/88, pulse 65, height  (1.676 m), weight 176 lb 6.4 oz (80 kg), last menstrual period 04/27/2021, SpO2 100 %.Body mass index is 28.47 kg/m.  General Appearance: Casual and Fairly Groomed  Eye Contact:  Good  Speech:  Clear and Coherent and Normal Rate  Volume:  Normal  Mood:   "ok"  Affect:  Appropriate and Congruent  Thought Process:  Coherent and Goal Directed  Orientation:  Full (Time, Place, and Person)  Thought Content: WDL and Logical   Suicidal Thoughts:  No  Homicidal Thoughts:  No  Memory:  Immediate;   Good Recent;   Good  Judgement:  Good  Insight:  Good  Psychomotor Activity:  Normal  Concentration:  Concentration: Good and Attention Span: Good  Recall:  Good  Fund of Knowledge: Good  Language: Good  Akathisia:  Negative  Handed:  Right  AIMS (if indicated): done AIMS= 0  Assets:  Communication Skills Desire for Improvement Resilience Vocational/Educational  ADL's:  Intact  Cognition: WNL  Sleep:  Good   Screenings: AIMS    Flowsheet Row Clinical Support from 07/07/2022 in [facility]  AIMS Total Score 0      GAD-7    Flowsheet Row Counselor from 07/06/2021 in [facility] Office Visit [facility]  Total GAD-7 Score 14 15      PHQ2-9    Flowsheet Row Office Visit from 06/29/2021 in [facility] Office Visit from 02/17/2021 in [facility] Visit from 11/11/2020 in [facility]  PHQ-2 Total Score PHQ-9 Total Score Flowsheet Row Counselor from 07/06/2021 in [facility] ED to Hosp-Admission (Discharged) from 05/19/2021 in [facility] [place] [facility] Admission (Discharged) from 05/18/2021 in [place] LONG PERIOPERATIVE AREA  C-SSRS RISK CATEGORY No Risk No Risk No Risk        Assessment and Plan:  Crystal Vasquez is a 52 yr old female who presents for Follow Up and Medication Management.  PPHx is significant for Depression, Anxiety, PTSD and possible ADHD, no history of Suicide Attempts, Self Injurious Behavior, or Psychiatric Hospitalizations.     Crystal Vasquez has continued to do well overall despite having some increased stress at work.  She is using coping skills and will be working on changing her work schedule to alleviate this.  We will not make any changes to her medications at the time.  Refills were sent in.  She did have elevated blood pressure and stressed the importance of her keeping her appointment with her PCP in a few weeks and discussing this with her.  She will return follow-up in approximately 2 months.   MDD, Recurrent, Moderate  GAD  PTSD: -Continue Zoloft 100 mg daily for depression and anxiety. 30 tablets with 1 refill -Continue Seroquel 50 mg QHS for augmentation, mood stability, and insomnia.  30 tablets with 1 refill.    Collaboration of Care:   Patient/Guardian was advised Release of Information must be obtained prior to any record release in order to collaborate their care with an outside provider. Patient/Guardian was advised if they have not already done so to contact the registration department to sign all necessary forms in order for [place] to release information regarding their care.   Consent: Patient/Guardian gives verbal consent for treatment and assignment of benefits for services provided during this visit. Patient/Guardian expressed understanding and agreed to proceed.    Lauro Franklin, MD 07/07/2022, 8:28 AM

## 2022-07-13 ENCOUNTER — Other Ambulatory Visit: Payer: Self-pay

## 2022-07-14 ENCOUNTER — Encounter: Payer: Self-pay | Admitting: Hematology and Oncology

## 2022-07-14 ENCOUNTER — Other Ambulatory Visit: Payer: Self-pay

## 2022-08-04 ENCOUNTER — Other Ambulatory Visit: Payer: Self-pay

## 2022-08-04 ENCOUNTER — Other Ambulatory Visit: Payer: Self-pay | Admitting: Internal Medicine

## 2022-08-04 ENCOUNTER — Ambulatory Visit: Payer: Self-pay | Attending: Internal Medicine | Admitting: Internal Medicine

## 2022-08-04 ENCOUNTER — Encounter: Payer: Self-pay | Admitting: Internal Medicine

## 2022-08-04 ENCOUNTER — Encounter: Payer: Self-pay | Admitting: Hematology and Oncology

## 2022-08-04 VITALS — BP 149/71 | HR 68 | Temp 98.6°F | Ht 66.0 in | Wt 172.0 lb

## 2022-08-04 DIAGNOSIS — Z7984 Long term (current) use of oral hypoglycemic drugs: Secondary | ICD-10-CM

## 2022-08-04 DIAGNOSIS — E1169 Type 2 diabetes mellitus with other specified complication: Secondary | ICD-10-CM

## 2022-08-04 DIAGNOSIS — E785 Hyperlipidemia, unspecified: Secondary | ICD-10-CM

## 2022-08-04 DIAGNOSIS — E1165 Type 2 diabetes mellitus with hyperglycemia: Secondary | ICD-10-CM

## 2022-08-04 DIAGNOSIS — Z1211 Encounter for screening for malignant neoplasm of colon: Secondary | ICD-10-CM

## 2022-08-04 DIAGNOSIS — I152 Hypertension secondary to endocrine disorders: Secondary | ICD-10-CM

## 2022-08-04 DIAGNOSIS — Z1231 Encounter for screening mammogram for malignant neoplasm of breast: Secondary | ICD-10-CM

## 2022-08-04 DIAGNOSIS — M5412 Radiculopathy, cervical region: Secondary | ICD-10-CM

## 2022-08-04 DIAGNOSIS — E1159 Type 2 diabetes mellitus with other circulatory complications: Secondary | ICD-10-CM

## 2022-08-04 LAB — GLUCOSE, POCT (MANUAL RESULT ENTRY): POC Glucose: 301 mg/dl — AB (ref 70–99)

## 2022-08-04 LAB — POCT GLYCOSYLATED HEMOGLOBIN (HGB A1C): HbA1c, POC (controlled diabetic range): 8.3 % — AB (ref 0.0–7.0)

## 2022-08-04 MED ORDER — OLMESARTAN MEDOXOMIL 40 MG PO TABS
40.0000 mg | ORAL_TABLET | Freq: Every day | ORAL | 3 refills | Status: DC
  Filled 2022-09-12: qty 30, 30d supply, fill #0
  Filled 2022-10-30: qty 30, 30d supply, fill #1
  Filled 2023-01-04: qty 30, 30d supply, fill #2
  Filled 2023-03-03 – 2023-03-05 (×2): qty 30, 30d supply, fill #3

## 2022-08-04 MED ORDER — AMLODIPINE BESYLATE 10 MG PO TABS
10.0000 mg | ORAL_TABLET | Freq: Every day | ORAL | 1 refills | Status: DC
  Filled 2022-09-12: qty 30, 30d supply, fill #0
  Filled 2022-10-30: qty 30, 30d supply, fill #1
  Filled 2022-12-07 (×2): qty 30, 30d supply, fill #2
  Filled 2023-01-04: qty 30, 30d supply, fill #3
  Filled 2023-03-03 – 2023-03-05 (×2): qty 30, 30d supply, fill #4

## 2022-08-04 MED ORDER — EMPAGLIFLOZIN 10 MG PO TABS
10.0000 mg | ORAL_TABLET | Freq: Every day | ORAL | 4 refills | Status: DC
  Filled 2022-08-04: qty 30, 30d supply, fill #0
  Filled 2022-09-12 – 2022-09-13 (×2): qty 30, 30d supply, fill #1
  Filled 2023-01-04: qty 30, 30d supply, fill #2

## 2022-08-04 MED ORDER — SIMVASTATIN 20 MG PO TABS
20.0000 mg | ORAL_TABLET | Freq: Every day | ORAL | 6 refills | Status: DC
  Filled 2022-09-12: qty 30, 30d supply, fill #0
  Filled 2022-10-30: qty 30, 30d supply, fill #1
  Filled 2023-01-04: qty 30, 30d supply, fill #2

## 2022-08-04 MED ORDER — HYDROCHLOROTHIAZIDE 25 MG PO TABS
25.0000 mg | ORAL_TABLET | Freq: Every day | ORAL | 1 refills | Status: DC
  Filled 2022-09-12: qty 30, 30d supply, fill #0
  Filled 2022-10-30: qty 30, 30d supply, fill #1
  Filled 2022-12-07 (×2): qty 30, 30d supply, fill #2
  Filled 2023-01-04: qty 30, 30d supply, fill #3
  Filled 2023-03-03 – 2023-03-05 (×2): qty 30, 30d supply, fill #4

## 2022-08-04 MED ORDER — GABAPENTIN 300 MG PO CAPS
300.0000 mg | ORAL_CAPSULE | Freq: Every day | ORAL | 1 refills | Status: DC
  Filled 2022-08-04 – 2022-10-30 (×2): qty 90, 90d supply, fill #0

## 2022-08-04 MED ORDER — HYDRALAZINE HCL 50 MG PO TABS
50.0000 mg | ORAL_TABLET | Freq: Three times a day (TID) | ORAL | 4 refills | Status: DC
Start: 2022-08-04 — End: 2023-03-29
  Filled 2022-08-04: qty 90, 30d supply, fill #0
  Filled 2022-10-03 – 2022-10-30 (×2): qty 90, 30d supply, fill #1
  Filled 2023-01-04: qty 90, 30d supply, fill #2

## 2022-08-04 NOTE — Progress Notes (Signed)
Patient ID: Crystal Vasquez, female    DOB: 26-Feb-1971  MRN: [id number]  CC: Diabetes (DM f/u. Med refills. /No questions / concerns/Yes to Tdap vax. )   Subjective: Crystal Vasquez is a 52 y.o. female who presents for chronic ds management Her concerns today include:  Patient with history of HTN, DM type II, HL, MDD/GAD/PTSD/possible ADHD followed by psychiatry,  Thyroid ds   Still living in her car.  Working at [employer].  DM: Results for orders placed or performed in visit on 08/04/22  POCT glucose (manual entry)  Result Value Ref Range   POC Glucose 301 (A) 70 - 99 mg/dl  POCT glycosylated hemoglobin (Hb A1C)  Result Value Ref Range   Hemoglobin A1C     HbA1c POC (<> result, manual entry)     HbA1c, POC (prediabetic range)     HbA1c, POC (controlled diabetic range) 8.3 (A) 0.0 - 7.0 %  -She is on glipizide 10 mg twice a day and metformin 1 g twice a day.  Takes meds consistently Not checking BS consistently Tries to eat healthy; loves salads but too a serving Started speed walking and slow jogging for exercise several days a week.  Plans to get bike.    HTN:  took meds already.  Current meds are hydralazine 50 mg/25 mg / 50 mg, bisoprolol 10 mg daily, amlodipine 10 mg daily, HCTZ 25 mg daily, Benicar 40 mg daily Has BP device checks once a wk.  Range 170/90-130/50 Limits salt No chest pains or shortness of breath. HL: taking and tolerating Zocor 20 mg daily   Patient Active Problem List   Diagnosis Date Noted   Subclinical hyperthyroidism 02/20/2022   Hyperlipidemia associated with type 2 diabetes mellitus (HCC) 01/24/2022   Excessive daytime sleepiness 10/31/2021   Snoring 10/31/2021   Fever postop 05/19/2021   Uterine polyp 05/09/2021   Hypertension associated with type 2 diabetes mellitus (HCC) 12/03/2020   Uncontrolled type 2 diabetes mellitus with hyperglycemia (HCC) 12/03/2020   Uterine mass 12/03/2020   Abnormal uterine bleeding (AUB) 11/11/2020    History of uterine fibroid 11/11/2020   Iron deficiency anemia due to chronic blood loss 11/11/2020   History of menorrhagia 11/11/2020     Current Outpatient Medications on File Prior to Visit  Medication Sig Dispense Refill   ACETAMINOPHEN EXTRA STRENGTH 500 MG capsule SMARTSIG:2 Capsule(s) By Mouth Daily PRN     bisoprolol (ZEBETA) 10 MG tablet Take 1 tablet (10 mg total) by mouth daily. 90 tablet 1   ferrous sulfate 325 (65 FE) MG tablet Take 325 mg by mouth in the morning.     fluticasone (VERAMYST) 27.5 MCG/SPRAY nasal spray Place 1 spray into the nose in the morning.     glipiZIDE (GLUCOTROL) 10 MG tablet Take 1 tablet (10 mg total) by mouth 2 (two) times daily before a meal. 180 tablet 2   metFORMIN (GLUCOPHAGE) 1000 MG tablet Take 1 tablet (1,000 mg total) by mouth 2 (two) times daily with a meal. 180 tablet 2   QUEtiapine (SEROQUEL) 50 MG tablet Take 1 tablet (50 mg total) by mouth at bedtime. 30 tablet 2   gabapentin (NEURONTIN) 300 MG capsule Take 1 capsule (300 mg total) by mouth at bedtime. (Patient not taking: Reported on 08/04/2022) 90 capsule 1   meloxicam (MOBIC) 15 MG tablet Take 1 tablet (15 mg total) by mouth daily as needed. (Patient not taking: Reported on 08/04/2022) 30 tablet 2   sertraline (ZOLOFT) 100 MG tablet  Take 1 tablet (100 mg total) by mouth daily. (Patient not taking: Reported on 08/04/2022) 30 tablet 2   No current facility-administered medications on file prior to visit.    No Known Allergies  Social History   Socioeconomic History   Marital status: Single    Spouse name: Not on file   Number of children: Not on file   Years of education: Not on file   Highest education level: Not on file  Occupational History   Not on file  Tobacco Use   Smoking status: Never    Passive exposure: Never   Smokeless tobacco: Never  Vaping Use   Vaping Use: Never used  Substance and Sexual Activity   Alcohol use: Not Currently   Drug use: Never   Sexual  activity: Not Currently  Other Topics Concern   Not on file  Social History Narrative   Not on file   Social Determinants of Health   Financial Resource Strain: High Risk (10/12/2021)   Overall Financial Resource Strain (CARDIA)    Difficulty of Paying Living Expenses: Very hard  Food Insecurity: Food Insecurity Present (10/12/2021)   Hunger Vital Sign    Worried About Running Out of Food in the Last Year: Often true    Ran Out of Food in the Last Year: Often true  Transportation Needs: Unmet Transportation Needs (10/12/2021)   PRAPARE - Administrator, Civil Service (Medical): Yes    Lack of Transportation (Non-Medical): Yes  Physical Activity: Not on file  Stress: Not on file  Social Connections: Not on file  Intimate Partner Violence: Not on file    Family History  Problem Relation Age of Onset   Diabetes Mother    Hypertension Mother    Diabetes Father    Hypertension Father    Endometrial cancer Maternal Aunt    Colon cancer Neg Hx    Breast cancer Neg Hx    Ovarian cancer Neg Hx    Pancreatic cancer Neg Hx    Prostate cancer Neg Hx     Past Surgical History:  Procedure Laterality Date   ABDOMINAL HYSTERECTOMY     BIOPSY THYROID     IR ANGIOGRAM PELVIS SELECTIVE OR SUPRASELECTIVE  05/09/2021   IR ANGIOGRAM SELECTIVE EACH ADDITIONAL VESSEL  05/09/2021   IR AORTAGRAM ABDOMINAL SERIALOGRAM  05/09/2021   IR EMBO TUMOR ORGAN ISCHEMIA INFARCT INC GUIDE ROADMAPPING  05/09/2021   IR RADIOLOGIST EVAL & MGMT  04/11/2021   IR US GUIDE VASC ACCESS LEFT  05/09/2021   ROBOTIC ASSISTED LAPAROSCOPIC HYSTERECTOMY AND SALPINGECTOMY Bilateral 05/18/2021   Procedure: XI ROBOTIC ASSISTED LAPAROSCOPIC HYSTERECTOMY AND SALPINGECTOMY, UTERUS MORE THAN 250 GRAMS;  Surgeon: Carver Fila, MD;  Location: [facility];  Service: Gynecology;  Laterality: Bilateral;    ROS: Review of Systems Negative except as stated above  PHYSICAL EXAM: BP (!) 149/71 (BP Location: Left Arm,  Patient Position: Sitting, Cuff Size: Normal)   Pulse 68   Temp 98.6 F (37 C) (Oral)   Ht 5\' 6"  (1.676 m)   Wt 172 lb (78 kg)   LMP 04/27/2021 (Exact Date)   SpO2 99%   BMI 27.76 kg/m   Physical Exam  General appearance - alert, well appearing, middle-aged Caucasian female and in no distress Mental status - normal mood, behavior, speech, dress, motor activity, and thought processes Chest - clear to auscultation, no wheezes, rales or rhonchi, symmetric air entry Heart - normal rate, regular rhythm, normal S1, S2, no murmurs,  rubs, clicks or gallops Extremities - peripheral pulses normal, no pedal edema, no clubbing or cyanosis      Latest Ref Rng & Units 10/27/2021    9:56 AM 05/23/2021    4:09 AM 05/22/2021    4:15 AM  CMP  Glucose 70 - 99 mg/dL 782  956  213   BUN 6 - 20 mg/dL 16  10  9    Creatinine 0.44 - 1.00 mg/dL 0.86  5.78  4.69   Sodium 135 - 145 mmol/L 139  137  135   Potassium 3.5 - 5.1 mmol/L 3.3  4.4  3.9   Chloride 98 - 111 mmol/L 101  104  105   CO2 22 - 32 mmol/L 31  22  22    Calcium 8.9 - 10.3 mg/dL 9.5  9.0  8.5   Total Protein 6.5 - 8.1 g/dL 7.7     Total Bilirubin 0.3 - 1.2 mg/dL 0.2     Alkaline Phos 38 - 126 U/L 60     AST 15 - 41 U/L 16     ALT 0 - 44 U/L 19      Lipid Panel  No results found for: "CHOL", "TRIG", "HDL", "CHOLHDL", "VLDL", "LDLCALC", "LDLDIRECT"  CBC    Component Value Date/Time   WBC 4.5 10/27/2021 0956   WBC 5.4 05/23/2021 0409   RBC 4.28 10/27/2021 0956   RBC 4.27 10/27/2021 0955   HGB 13.1 10/27/2021 0956   HGB 9.3 (L) 11/11/2020 1545   HCT 37.7 10/27/2021 0956   HCT 28.5 (L) 11/11/2020 1545   PLT 243 10/27/2021 0956   PLT 430 11/11/2020 1545   MCV 88.1 10/27/2021 0956   MCV 87 11/11/2020 1545   MCH 30.6 10/27/2021 0956   MCHC 34.7 10/27/2021 0956   RDW 12.9 10/27/2021 0956   RDW 19.1 (H) 11/11/2020 1545   LYMPHSABS 1.7 10/27/2021 0956   LYMPHSABS 1.9 11/11/2020 1545   MONOABS 0.4 10/27/2021 0956   EOSABS 0.1  10/27/2021 0956   EOSABS 0.1 11/11/2020 1545   BASOSABS 0.0 10/27/2021 0956   BASOSABS 0.0 11/11/2020 1545    ASSESSMENT AND PLAN: 1. Uncontrolled type 2 diabetes mellitus with hyperglycemia (HCC) Not at goal. Encourage healthy eating habits. Discussed trying her with evening dose of Lantus insulin or Trulicity.  However patient lives out of her car so putting her on a medication that would require refrigeration is not a good idea.  Also she is not interested in Trulicity.  We discussed adding an oral medication Jardiance.  She is agreeable to this.  Advised that the medication will cause frequent urination.  Advised to stop the medicine if she ever develops any illness that causes vomiting or diarrhea; can restart after it resolves.  Continue current dose of glipizide and metformin. Encouraged to get diabetic eye exam done when she is able to afford. - POCT glucose (manual entry) - POCT glycosylated hemoglobin (Hb A1C) - empagliflozin (JARDIANCE) 10 MG TABS tablet; Take 1 tablet (10 mg total) by mouth daily before breakfast.  Dispense: 30 tablet; Refill: 4  2. Hypertension associated with type 2 diabetes mellitus (HCC) Not at goal.  Recommend increase hydralazine to 50 mg 3 times a day.  Continue other medications as listed above. - amLODipine (NORVASC) 10 MG tablet; Take 1 tablet (10 mg total) by mouth daily.  Dispense: 90 tablet; Refill: 1 - hydrochlorothiazide (HYDRODIURIL) 25 MG tablet; Take 1 tablet (25 mg total) by mouth daily.  Dispense: 90 tablet; Refill: 1 -  olmesartan (BENICAR) 40 MG tablet; Take 1 tablet (40 mg total) by mouth daily.  Dispense: 90 tablet; Refill: 3 - hydrALAZINE (APRESOLINE) 50 MG tablet; Take 1 tablet (50 mg total) by mouth 3 (three) times daily.  Dispense: 90 tablet; Refill: 4  3. Hyperlipidemia associated with type 2 diabetes mellitus (HCC) Continue Zocor. - simvastatin (ZOCOR) 20 MG tablet; Take 1 tablet (20 mg total) by mouth at bedtime.  Dispense: 30  tablet; Refill: 6  4. Screening for colon cancer Agreeable to doing fit test. - Fecal occult blood, imunochemical([facility])  5. Encounter for screening mammogram for malignant neoplasm of breast Given mammogram scholarship. - MM Digital Screening; Future    Patient was given the opportunity to ask questions.  Patient verbalized understanding of the plan and was able to repeat key elements of the plan.   This documentation was completed using Paediatric nurse.  Any transcriptional errors are unintentional.  Orders Placed This Encounter  Procedures   Fecal occult blood, imunochemical([facility])   MM Digital Screening   POCT glucose (manual entry)   POCT glycosylated hemoglobin (Hb A1C)     Requested Prescriptions   Signed Prescriptions Disp Refills   amLODipine (NORVASC) 10 MG tablet 90 tablet 1    Sig: Take 1 tablet (10 mg total) by mouth daily.   hydrochlorothiazide (HYDRODIURIL) 25 MG tablet 90 tablet 1    Sig: Take 1 tablet (25 mg total) by mouth daily.   olmesartan (BENICAR) 40 MG tablet 90 tablet 3    Sig: Take 1 tablet (40 mg total) by mouth daily.   simvastatin (ZOCOR) 20 MG tablet 30 tablet 6    Sig: Take 1 tablet (20 mg total) by mouth at bedtime.   empagliflozin (JARDIANCE) 10 MG TABS tablet 30 tablet 4    Sig: Take 1 tablet (10 mg total) by mouth daily before breakfast.   hydrALAZINE (APRESOLINE) 50 MG tablet 90 tablet 4    Sig: Take 1 tablet (50 mg total) by mouth 3 (three) times daily.    Return in about 4 months (around 12/05/2022).  Jonah Blue, MD, FACP

## 2022-08-07 ENCOUNTER — Other Ambulatory Visit: Payer: Self-pay

## 2022-08-10 ENCOUNTER — Other Ambulatory Visit: Payer: Self-pay

## 2022-08-10 ENCOUNTER — Encounter: Payer: Self-pay | Admitting: Hematology and Oncology

## 2022-08-11 ENCOUNTER — Other Ambulatory Visit: Payer: Self-pay

## 2022-08-15 ENCOUNTER — Other Ambulatory Visit: Payer: Self-pay

## 2022-08-17 ENCOUNTER — Other Ambulatory Visit: Payer: Self-pay

## 2022-08-21 ENCOUNTER — Encounter: Payer: Self-pay | Admitting: Internal Medicine

## 2022-08-21 ENCOUNTER — Ambulatory Visit (INDEPENDENT_AMBULATORY_CARE_PROVIDER_SITE_OTHER): Payer: Self-pay | Admitting: Internal Medicine

## 2022-08-21 VITALS — BP 132/80 | HR 66 | Ht 66.0 in | Wt 171.0 lb

## 2022-08-21 DIAGNOSIS — E042 Nontoxic multinodular goiter: Secondary | ICD-10-CM

## 2022-08-21 DIAGNOSIS — E0590 Thyrotoxicosis, unspecified without thyrotoxic crisis or storm: Secondary | ICD-10-CM

## 2022-08-21 DIAGNOSIS — Z7984 Long term (current) use of oral hypoglycemic drugs: Secondary | ICD-10-CM

## 2022-08-21 DIAGNOSIS — E1165 Type 2 diabetes mellitus with hyperglycemia: Secondary | ICD-10-CM

## 2022-08-21 LAB — TSH: TSH: 0.28 u[IU]/mL — ABNORMAL LOW (ref 0.35–5.50)

## 2022-08-21 LAB — T4, FREE: Free T4: 0.84 ng/dL (ref 0.60–1.60)

## 2022-08-21 LAB — POCT GLUCOSE (DEVICE FOR HOME USE): POC Glucose: 233 mg/dl — AB (ref 70–99)

## 2022-08-21 MED ORDER — GLIPIZIDE 10 MG PO TABS: 15.0000 mg | ORAL_TABLET | Freq: Two times a day (BID) | ORAL | 2 refills | Status: DC

## 2022-08-21 NOTE — Progress Notes (Unsigned)
Name: Crystal Vasquez  MRN/ DOB: [id number], 1971/01/11   Age/ Sex: 52 y.o., female    PCP: Marcine Matar, MD   Reason for Endocrinology Evaluation: Type 2 Diabetes Mellitus     Date of Initial Endocrinology Visit: 02/20/2022    PATIENT IDENTIFIER: Crystal Vasquez is a 52 y.o. female with a past medical history of HTN, T2DM, history of thyroid disease. The patient presented for initial endocrinology clinic visit on 02/20/2022 for consultative assistance with her diabetes management.    HPI: [name] was    Diagnosed with DM 10-15 yrs ago  Prior Medications tried/Intolerance: Glipizide was started ~ 4 yrs ago                 Hemoglobin A1c has ranged from 6.7% in 2022, peaking at 9.0% in 2022.   She lives in her car which makes it difficult to eat healthy     THYROID HISTORY She had an FNA ~ 4 yrs ago of the thyroid while living in [place] Denies local neck swelling   Has Maternal grandmother and cousin and maternal aunt with  Hx of MEN   I had ordered thyroid ultrasound 02/2022 but by her visit in 08/2022 has not been done  SUBJECTIVE:   During the last visit (02/20/2022): A1c 7.8%     Today (08/21/22): Crystal Vasquez is here for follow-up on diabetes management.She  checks her blood sugars 0 times daily.   She admits to dietary indiscretions , she continues to live in her car She is having GI issues , craving fruits   She was started on Jardiance  through PCP 's office  Has loose stools with occasional diarrhea , that she attributes to eating certain foods such as dairy Has occasional palpitations  Denies local neck swelling   HOME DIABETES REGIMEN: Glipizide 10 mg twice daily Metformin 1000 mg twice daily Jardiance 10 mg     Statin: Yes ACE-I/ARB: Yes    METER DOWNLOAD SUMMARY: Did not bring   DIABETIC COMPLICATIONS: Microvascular complications:   Denies: CKD, neuropathy, retinopathy  Last eye exam: Completed  2022  Macrovascular complications:   Denies: CAD, PVD, CVA   PAST HISTORY: Past Medical History:  Past Medical History:  Diagnosis Date   Anemia    Asthma    due to acid reflux   Depression    DM2    Dyspnea    GERD (gastroesophageal reflux disease)    Headache    Heart murmur    benign   Hypertension    Obesity (BMI 30-39.9)    Thyroid disease    Past Surgical History:  Past Surgical History:  Procedure Laterality Date   ABDOMINAL HYSTERECTOMY     BIOPSY THYROID     IR ANGIOGRAM PELVIS SELECTIVE OR SUPRASELECTIVE  05/09/2021   IR ANGIOGRAM SELECTIVE EACH ADDITIONAL VESSEL  05/09/2021   IR AORTAGRAM ABDOMINAL SERIALOGRAM  05/09/2021   IR EMBO TUMOR ORGAN ISCHEMIA INFARCT INC GUIDE ROADMAPPING  05/09/2021   IR RADIOLOGIST EVAL & MGMT  04/11/2021   IR US GUIDE VASC ACCESS LEFT  05/09/2021   ROBOTIC ASSISTED LAPAROSCOPIC HYSTERECTOMY AND SALPINGECTOMY Bilateral 05/18/2021   Procedure: XI ROBOTIC ASSISTED LAPAROSCOPIC HYSTERECTOMY AND SALPINGECTOMY, UTERUS MORE THAN 250 GRAMS;  Surgeon: Carver Fila, MD;  Location: [facility];  Service: Gynecology;  Laterality: Bilateral;    Social History:  reports that she has never smoked. She has never been exposed to tobacco smoke. She has never used smokeless tobacco.  She reports that she does not currently use alcohol. She reports that she does not use drugs. Family History:  Family History  Problem Relation Age of Onset   Diabetes Mother    Hypertension Mother    Diabetes Father    Hypertension Father    Endometrial cancer Maternal Aunt    Colon cancer Neg Hx    Breast cancer Neg Hx    Ovarian cancer Neg Hx    Pancreatic cancer Neg Hx    Prostate cancer Neg Hx      HOME MEDICATIONS: Allergies as of 08/21/2022   No Known Allergies      Medication List        Accurate as of August 21, 2022  3:32 PM. If you have any questions, ask your nurse or doctor.          Acetaminophen Extra Strength 500 MG Caps SMARTSIG:2  Capsule(s) By Mouth Daily PRN   amLODipine 10 MG tablet Commonly known as: NORVASC Take 1 tablet (10 mg total) by mouth daily.   bisoprolol 10 MG tablet Commonly known as: ZEBETA Take 1 tablet (10 mg total) by mouth daily.   ferrous sulfate 325 (65 FE) MG tablet Take 325 mg by mouth in the morning.   fluticasone 27.5 MCG/SPRAY nasal spray Commonly known as: VERAMYST Place 1 spray into the nose in the morning.   gabapentin 300 MG capsule Commonly known as: NEURONTIN Take 1 capsule (300 mg total) by mouth at bedtime.   glipiZIDE 10 MG tablet Commonly known as: GLUCOTROL Take 1.5 tablets (15 mg total) by mouth 2 (two) times daily before a meal. What changed: how much to take Changed by: Scarlette Shorts, MD   hydrALAZINE 50 MG tablet Commonly known as: APRESOLINE Take 1 tablet (50 mg total) by mouth 3 (three) times daily.   hydrochlorothiazide 25 MG tablet Commonly known as: HYDRODIURIL Take 1 tablet (25 mg total) by mouth daily.   Jardiance 10 MG Tabs tablet Generic drug: empagliflozin Take 1 tablet (10 mg total) by mouth daily before breakfast.   meloxicam 15 MG tablet Commonly known as: MOBIC Take 1 tablet (15 mg total) by mouth daily as needed.   metFORMIN 1000 MG tablet Commonly known as: GLUCOPHAGE Take 1 tablet (1,000 mg total) by mouth 2 (two) times daily with a meal.   olmesartan 40 MG tablet Commonly known as: BENICAR Take 1 tablet (40 mg total) by mouth daily.   QUEtiapine 50 MG tablet Commonly known as: SEROquel Take 1 tablet (50 mg total) by mouth at bedtime.   sertraline 100 MG tablet Commonly known as: Zoloft Take 1 tablet (100 mg total) by mouth daily.   simvastatin 20 MG tablet Commonly known as: ZOCOR Take 1 tablet (20 mg total) by mouth at bedtime.         ALLERGIES: No Known Allergies   REVIEW OF SYSTEMS: A comprehensive ROS was conducted with the patient and is negative except as per HPI    OBJECTIVE:   VITAL SIGNS: BP  132/80 (BP Location: Left Arm, Patient Position: Sitting, Cuff Size: Large)   Pulse 66   Ht 5\' 6"  (1.676 m)   Wt 171 lb (77.6 kg)   LMP 04/27/2021 (Exact Date)   SpO2 98%   BMI 27.60 kg/m    PHYSICAL EXAM:  General: Pt appears well and is in NAD  Neck: General: Supple without adenopathy or carotid bruits. Thyroid: Prominent thyroid gland  Lungs: Clear with good BS bilat  Heart: RRR   Abdomen:  soft, nontender  Extremities:  Lower extremities - No pretibial edema.   Neuro: MS is good with appropriate affect, pt is alert and Ox3    DM foot exam: 02/20/2022  The skin of the feet is intact without sores or ulcerations. The pedal pulses are 2+ on right and 2+ on left. The sensation is intact to a screening 5.07, 10 gram monofilament bilaterally   DATA REVIEWED:  Lab Results  Component Value Date   HGBA1C 8.3 (A) 08/04/2022   HGBA1C 7.8 (A) 01/24/2022   HGBA1C 8.5 (A) 06/29/2021   ****  ASSESSMENT / PLAN / RECOMMENDATIONS:   1) Type 2 Diabetes Mellitus, poorly controlled, Without complications - Most recent A1c of 8.3 %. Goal A1c < 7.0 %.    -Patient was social determinants, currently living in her car, this makes it very difficult to get access to healthy food, or glucose checks -She was started on Jardiance through her PCPs office, unclear if she is on patient assistance or not but so far has been able to get through the pharmacy downstairs -I will increase glipizide as below -No changes to metformin  MEDICATIONS: Increase glipizide 10 mg, 1.5 tablet before breakfast and 1.5 tablet before supper Continue metformin 1000 mg twice daily Continue Jardiance 10 mg daily  EDUCATION / INSTRUCTIONS: BG monitoring instructions: Patient is instructed to check her blood sugars 2-3 times a week. Call [facility] if: BG persistently < 70  I reviewed the Rule of 15 for the treatment of hypoglycemia in detail with the patient. Literature supplied.   2) Diabetic  complications:  Eye: Does not have known diabetic retinopathy.  Neuro/ Feet: Does not have known diabetic peripheral neuropathy. Renal: Patient does not have known baseline CKD. She is  on an ACEI/ARB at present.  3) Multinodular goiter:  -Patient is s/p benign FNA of a thyroid nodule approximately 4 years ago while living in [place] -No local neck symptoms -Of note the patient has a 2 nd and 3rd  degree relatives with multiple endocrine neoplasia -I had ordered thyroid ultrasound on the last visit, the patient was called to schedule appointment but she has been busy and unable to schedule, new order has been placed today  4) Subclinical Hyperthyroidism:  -Possibly due to autonomous thyroid nodule -TSH ****    Follow-up in 6 months  Signed electronically by: Lyndle Herrlich, MD  [facility]  [facility] [address] [place] [place], [place] [address] Phone: [phone] FAX: [phone]   CC: Marcine Matar, MD [address] [place] [address] [place] [address] Phone: [phone]  Fax: [phone]    Return to Endocrinology clinic as below: Future Appointments  Date Time Provider Department Center  09/11/2022  8:00 AM Renaldo Fiddler, Mardelle Matte, MD [facility] None  01/25/2023  9:50 AM Ward Boissonneault, Konrad Dolores, MD [facility] None

## 2022-08-21 NOTE — Patient Instructions (Addendum)
Take Glipizide 10 mg, 1.5 tablet Before Breakfast and 1.5 tablet before Supper  Continue Metformin 1000 mg twice daily Jardiance 10 mg daily      HOW TO TREAT LOW BLOOD SUGARS (Blood sugar LESS THAN 70 MG/DL) Please follow the RULE OF 15 for the treatment of hypoglycemia treatment (when your (blood sugars are less than 70 mg/dL)   STEP 1: Take 15 grams of carbohydrates when your blood sugar is low, which includes:  3-4 GLUCOSE TABS  OR 3-4 OZ OF JUICE OR REGULAR SODA OR ONE TUBE OF GLUCOSE GEL    STEP 2: RECHECK blood sugar in 15 MINUTES STEP 3: If your blood sugar is still low at the 15 minute recheck --> then, go back to STEP 1 and treat AGAIN with another 15 grams of carbohydrates.

## 2022-08-22 ENCOUNTER — Other Ambulatory Visit: Payer: Self-pay

## 2022-08-22 ENCOUNTER — Encounter: Payer: Self-pay | Admitting: Hematology and Oncology

## 2022-08-23 ENCOUNTER — Telehealth: Payer: Self-pay

## 2022-08-23 NOTE — Telephone Encounter (Signed)
Telephoned patient at mobile number. Left a voice message with [facility] (scholarship) contact information. 

## 2022-08-24 IMAGING — US US PELVIS COMPLETE WITH TRANSVAGINAL
1 series · 15 of 25 positions shown · non-contrast
Comparison: None

CLINICAL DATA: Abnormal uterine bleeding, enlarged uterus on exam,
history fibroids, LMP 822

EXAM:
TRANSABDOMINAL AND TRANSVAGINAL ULTRASOUND OF PELVIS
TECHNIQUE: Both transabdominal and transvaginal ultrasound examinations of the
pelvis were performed. Transabdominal technique was performed for
global imaging of the pelvis including uterus, ovaries, adnexal
regions, and pelvic cul-de-sac. It was necessary to proceed with
endovaginal exam following the transabdominal exam to visualize the
endometrium and LEFT ovary.

[Series 1: us pelvis complete with transvaginal · 15 of 95 slices shown]
[im 1/95]
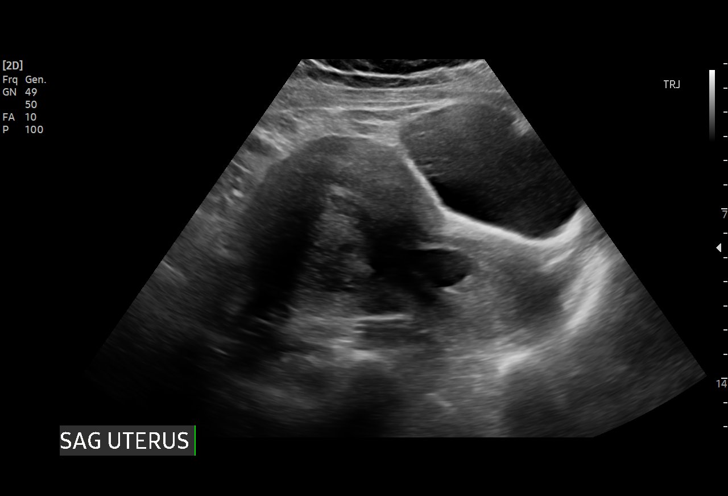
[im 8/95]
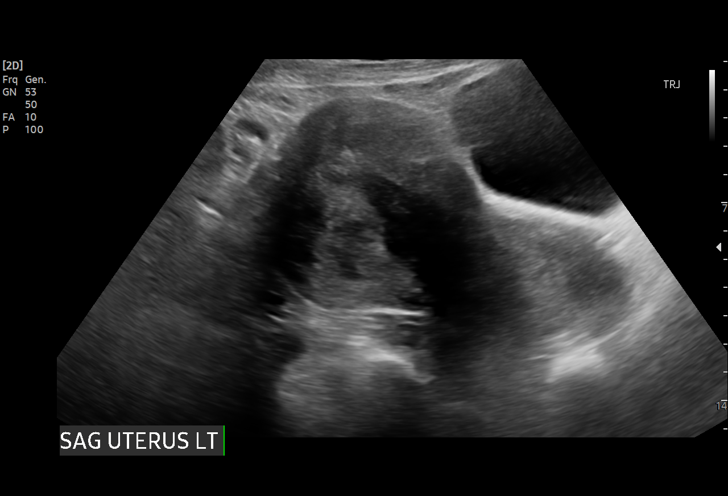
[im 16/95]
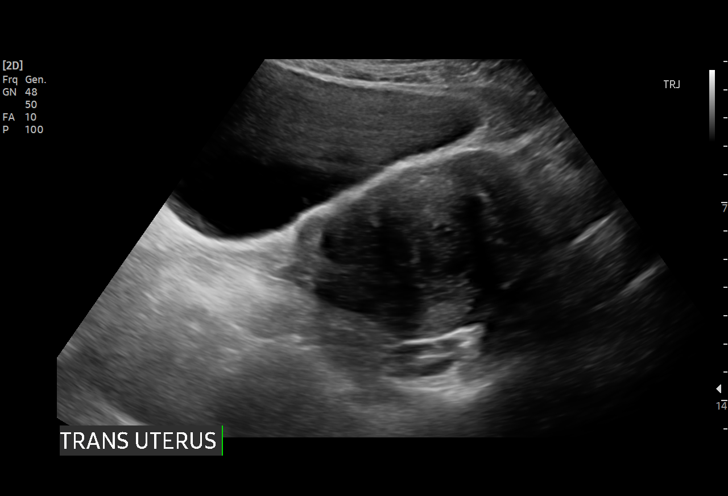
[im 20/95]
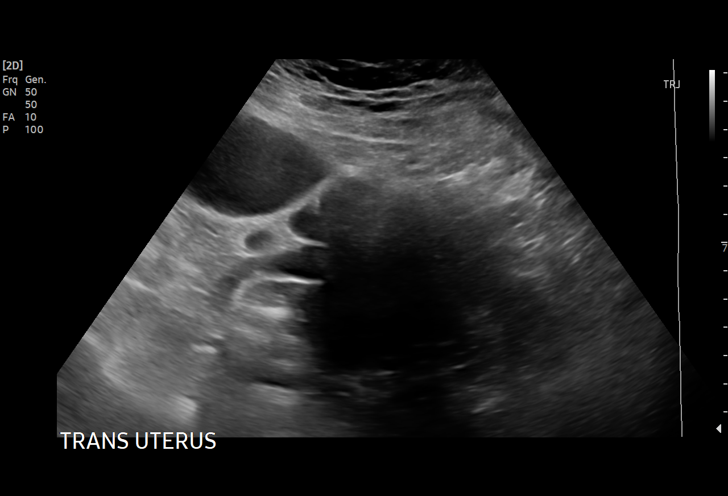
[im 28/95]
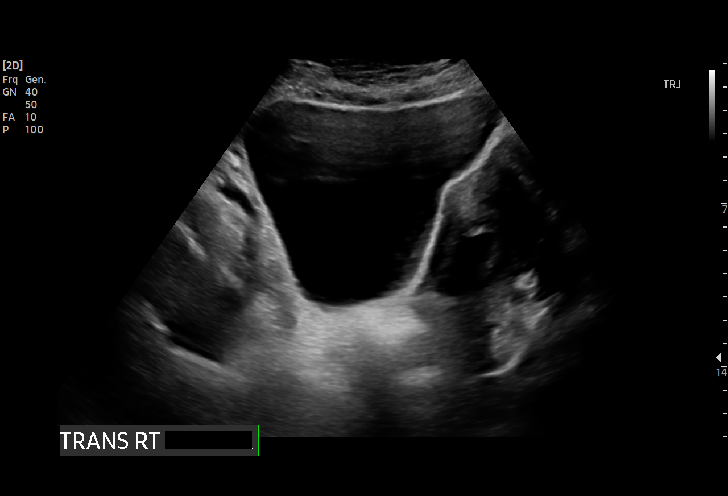
[im 36/95]
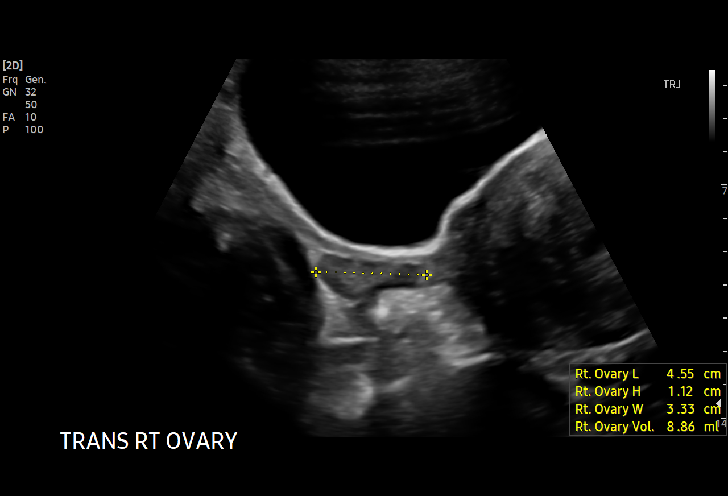
[im 40/95]
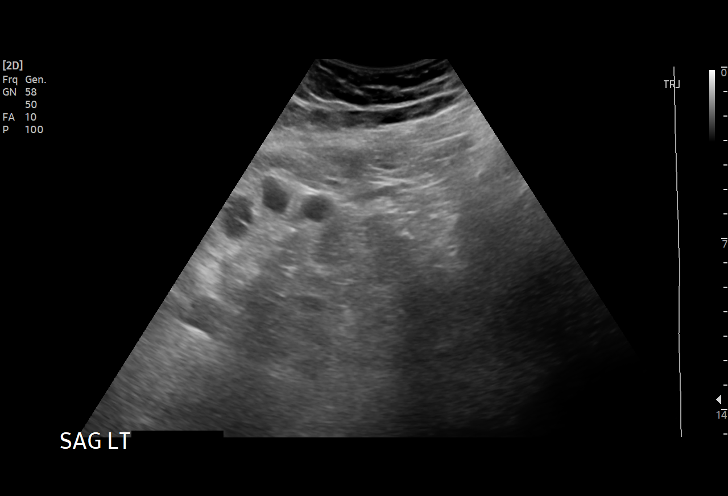
[im 48/95]
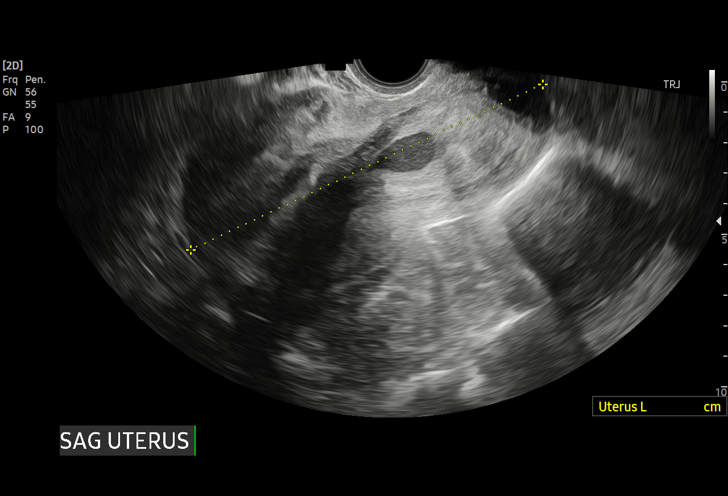
[im 55/95]
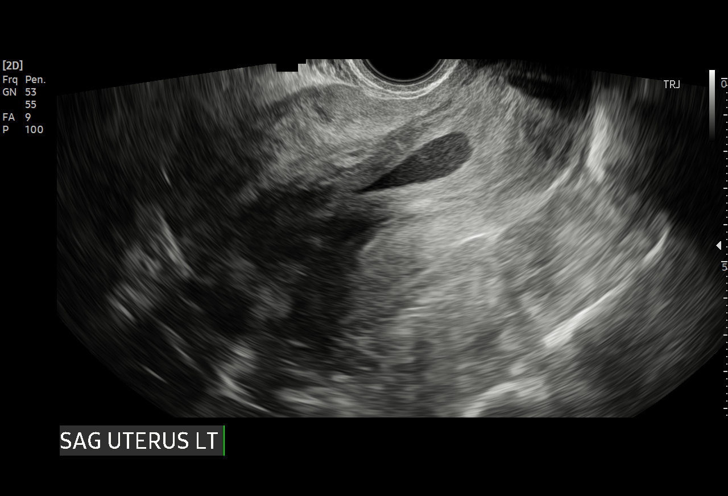
[im 59/95]
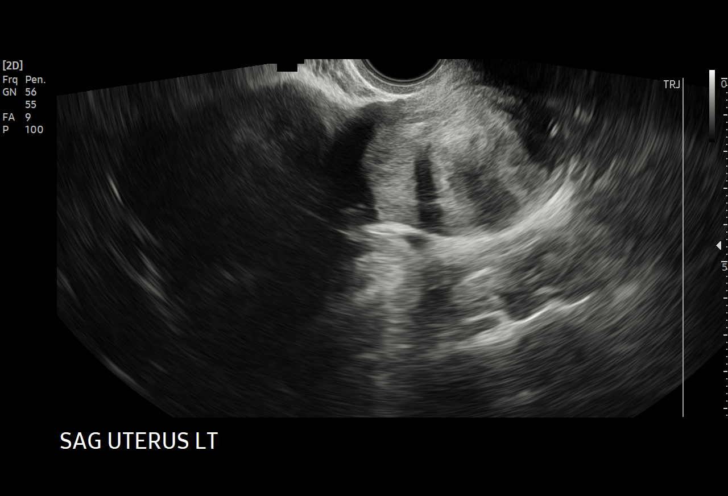
[im 67/95]
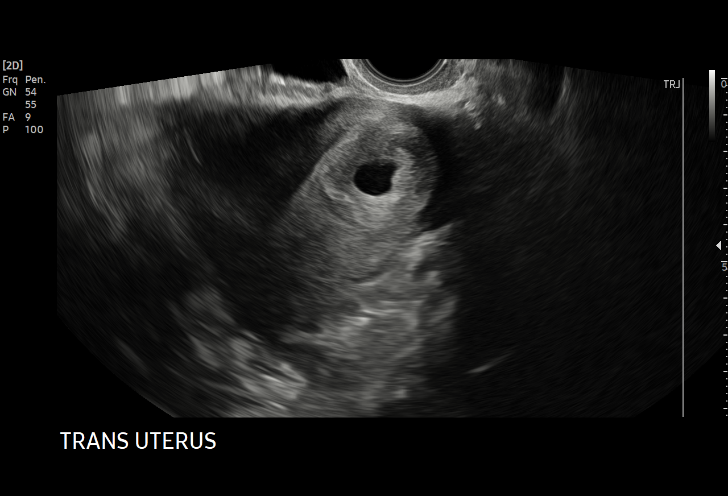
[im 75/95]
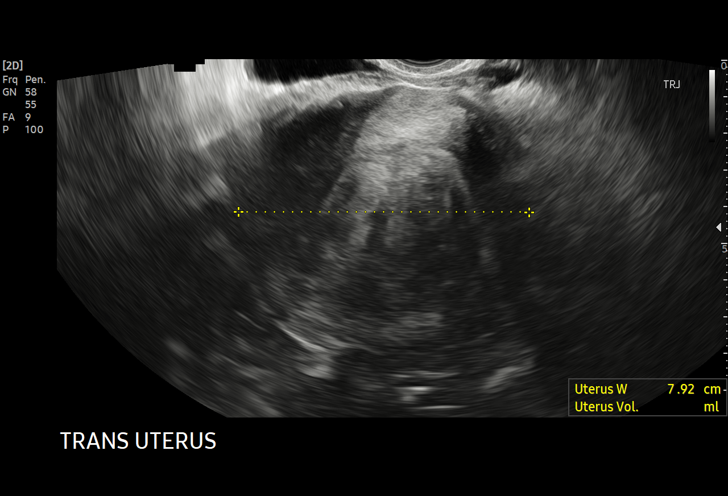
[im 79/95]
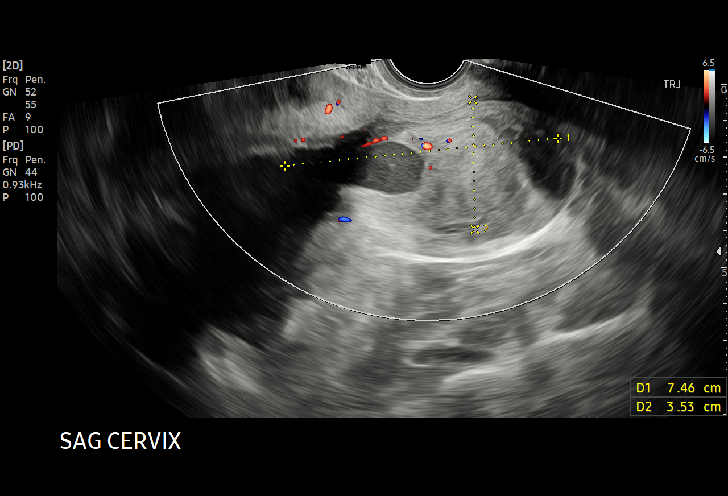
[im 87/95]
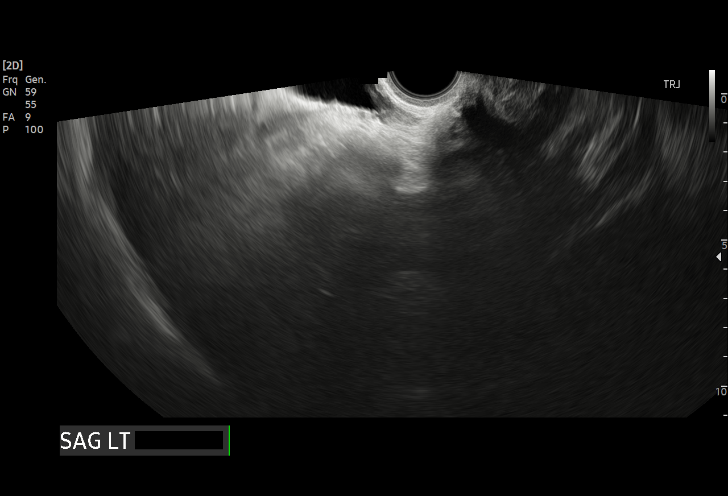
[im 95/95]
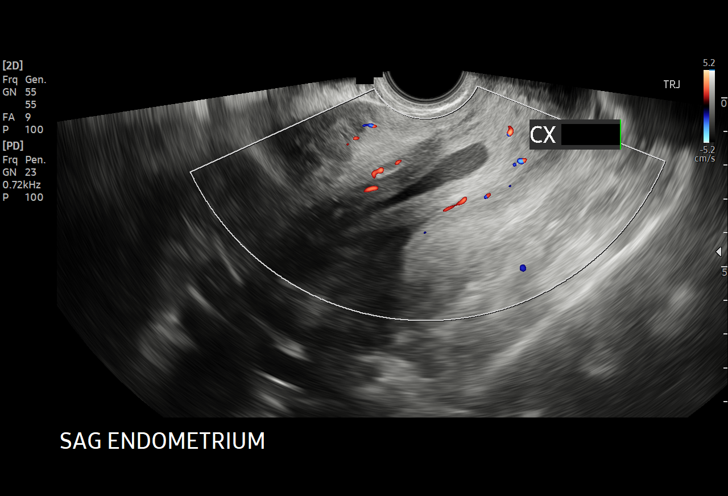

[15 of 25 positions shown; findings below may reference images not displayed]

FINDINGS: Uterus

Measurements: 12.7 x 7.8 x 9.6 cm = volume: 498 mL. Anteverted.
Heterogeneous myometrium. Scattered areas of shadowing. Fundal
submucosal mass 5.5 x 4.5 x 4.8 cm, heterogeneous, consistent with
leiomyoma. Second mass at lower uterine segment/cervix 7.5 x 3.5 x
4.9 cm, heterogenous with few areas of shadowing and tiny focus of
cystic change.

Endometrium

Thickness: 14 mm. Complex fluid collection within endometrial canal.
No mass.

Right ovary

Measurements: 4.0 x 2.5 x 2.9 cm = volume: 15.3 mL. Not visualized,
likely obscured by bowel

Left ovary

Not visualized, likely obscured by bowel

Other findings

No free pelvic fluid.  No adnexal masses.
IMPRESSION: Submucosal leiomyoma at upper uterus 5.5 cm diameter.

Additional 7.5 x 3.5 x 4.9 cm diameter suspected mass at lower
uterine segment/cervix associated with complex fluid collection
within the endometrial canal above the mass.

This mass could represent a cervical neoplasm, cervical leiomyoma,
less likely endometrial polyp or myometrial contraction; further
characterization of this mass lesion by MR imaging with and without
contrast recommended.

## 2022-09-11 ENCOUNTER — Encounter: Payer: Self-pay | Admitting: Hematology and Oncology

## 2022-09-11 ENCOUNTER — Other Ambulatory Visit: Payer: Self-pay

## 2022-09-11 ENCOUNTER — Encounter (HOSPITAL_COMMUNITY): Payer: Self-pay | Admitting: Student in an Organized Health Care Education/Training Program

## 2022-09-11 ENCOUNTER — Ambulatory Visit (INDEPENDENT_AMBULATORY_CARE_PROVIDER_SITE_OTHER): Payer: No Payment, Other | Admitting: Student in an Organized Health Care Education/Training Program

## 2022-09-11 VITALS — BP 137/79 | HR 60 | Ht 66.0 in | Wt 170.0 lb

## 2022-09-11 DIAGNOSIS — F331 Major depressive disorder, recurrent, moderate: Secondary | ICD-10-CM | POA: Diagnosis not present

## 2022-09-11 DIAGNOSIS — F411 Generalized anxiety disorder: Secondary | ICD-10-CM

## 2022-09-11 DIAGNOSIS — F4310 Post-traumatic stress disorder, unspecified: Secondary | ICD-10-CM

## 2022-09-11 MED ORDER — QUETIAPINE FUMARATE 50 MG PO TABS
50.0000 mg | ORAL_TABLET | Freq: Every day | ORAL | 2 refills | Status: DC
Start: 2022-09-11 — End: 2022-12-08
  Filled 2022-09-11 – 2022-10-03 (×2): qty 30, 30d supply, fill #0
  Filled 2022-10-30: qty 30, 30d supply, fill #1
  Filled 2022-12-07 (×2): qty 30, 30d supply, fill #2

## 2022-09-11 MED ORDER — SERTRALINE HCL 100 MG PO TABS
150.0000 mg | ORAL_TABLET | Freq: Every day | ORAL | 2 refills | Status: DC
Start: 2022-09-11 — End: 2022-12-08
  Filled 2022-09-11 – 2022-09-12 (×2): qty 45, 30d supply, fill #0
  Filled 2022-10-30: qty 45, 30d supply, fill #1
  Filled 2022-12-07 (×2): qty 45, 30d supply, fill #2

## 2022-09-11 NOTE — Progress Notes (Signed)
BH MD/PA/NP OP Progress Note  09/11/2022 8:28 AM Crystal Vasquez  MRN:  [id number]  Chief Complaint:  Chief Complaint  Patient presents with   Follow-up   Depression   Anxiety   HPI:  Crystal Vasquez is a 52 yr old female who presents for Follow Up and Medication Management.  PPHx is significant for Depression, Anxiety, PTSD and possible ADHD, no history of Suicide Attempts, Self Injurious Behavior, or Psychiatric Hospitalizations.    She reports that she has been doing okay but has been going through a bit of a rough patch lately.  She reports that the mother of one of her friends died and this has been weighing on her.  She also reports that she has switched [employer] to work at and she realized that she had made a family there and so their absence has also been taking bit of a toll on her.  She reports no side effects to her medications.  Discussed increasing her Zoloft to address this and she was agreeable with this.  She reports no SI, HI, or AVH.  She reports her sleep is good.  She reports her appetite is doing good.  She reports some constipation due to restarting iron supplementation but otherwise reports no other concerns at present.  She will return for follow-up in approximately 6 to 8 weeks.   Visit Diagnosis:    ICD-10-CM   1. MDD (major depressive disorder), recurrent episode, moderate (HCC)  F33.1 sertraline (ZOLOFT) 100 MG tablet    QUEtiapine (SEROQUEL) 50 MG tablet    2. Generalized anxiety disorder  F41.1 sertraline (ZOLOFT) 100 MG tablet    QUEtiapine (SEROQUEL) 50 MG tablet    3. PTSD (post-traumatic stress disorder)  F43.10 sertraline (ZOLOFT) 100 MG tablet    QUEtiapine (SEROQUEL) 50 MG tablet      Past Psychiatric History: Depression, Anxiety, PTSD and possible ADHD, no history of Suicide Attempts, Self Injurious Behavior, or Psychiatric Hospitalizations.    Past Medical History:  Past Medical History:  Diagnosis Date   Anemia    Asthma    due to acid  reflux   Depression    DM2    Dyspnea    GERD (gastroesophageal reflux disease)    Headache    Heart murmur    benign   Hypertension    Obesity (BMI 30-39.9)    Thyroid disease     Past Surgical History:  Procedure Laterality Date   ABDOMINAL HYSTERECTOMY     BIOPSY THYROID     IR ANGIOGRAM PELVIS SELECTIVE OR SUPRASELECTIVE  05/09/2021   IR ANGIOGRAM SELECTIVE EACH ADDITIONAL VESSEL  05/09/2021   IR AORTAGRAM ABDOMINAL SERIALOGRAM  05/09/2021   IR EMBO TUMOR ORGAN ISCHEMIA INFARCT INC GUIDE ROADMAPPING  05/09/2021   IR RADIOLOGIST EVAL & MGMT  04/11/2021   IR US GUIDE VASC ACCESS LEFT  05/09/2021   ROBOTIC ASSISTED LAPAROSCOPIC HYSTERECTOMY AND SALPINGECTOMY Bilateral 05/18/2021   Procedure: XI ROBOTIC ASSISTED LAPAROSCOPIC HYSTERECTOMY AND SALPINGECTOMY, UTERUS MORE THAN 250 GRAMS;  Surgeon: Carver Fila, MD;  Location: [facility];  Service: Gynecology;  Laterality: Bilateral;    Family Psychiatric History: Mother- Bipolar Disorder, Suicide 98 (OD) Sister- Schizophrenia, Suicide 2010 (OD) Brother- Bipolar Disorder, EtOH Abuse, Polysubstance Abuse  Family History:  Family History  Problem Relation Age of Onset   Diabetes Mother    Hypertension Mother    Diabetes Father    Hypertension Father    Endometrial cancer Maternal Aunt    Colon cancer Neg  Hx    Breast cancer Neg Hx    Ovarian cancer Neg Hx    Pancreatic cancer Neg Hx    Prostate cancer Neg Hx     Social History:  Social History   Socioeconomic History   Marital status: Single    Spouse name: Not on file   Number of children: Not on file   Years of education: Not on file   Highest education level: Not on file  Occupational History   Not on file  Tobacco Use   Smoking status: Never    Passive exposure: Never   Smokeless tobacco: Never  Vaping Use   Vaping Use: Never used  Substance and Sexual Activity   Alcohol use: Not Currently   Drug use: Never   Sexual activity: Not Currently  Other  Topics Concern   Not on file  Social History Narrative   Not on file   Social Determinants of Health   Financial Resource Strain: High Risk (10/12/2021)   Overall Financial Resource Strain (CARDIA)    Difficulty of Paying Living Expenses: Very hard  Food Insecurity: Food Insecurity Present (10/12/2021)   Hunger Vital Sign    Worried About Running Out of Food in the Last Year: Often true    Ran Out of Food in the Last Year: Often true  Transportation Needs: Unmet Transportation Needs (10/12/2021)   PRAPARE - Administrator, Civil Service (Medical): Yes    Lack of Transportation (Non-Medical): Yes  Physical Activity: Not on file  Stress: Not on file  Social Connections: Not on file    Allergies: No Known Allergies  Metabolic Disorder Labs: Lab Results  Component Value Date   HGBA1C 8.3 (A) 08/04/2022   No results found for: "PROLACTIN" No results found for: "CHOL", "TRIG", "HDL", "CHOLHDL", "VLDL", "LDLCALC" Lab Results  Component Value Date   TSH 0.28 (L) 08/21/2022   TSH 0.21 (L) 02/20/2022    Therapeutic Level Labs: No results found for: "LITHIUM" No results found for: "VALPROATE" No results found for: "CBMZ"  Current Medications: Current Outpatient Medications  Medication Sig Dispense Refill   ACETAMINOPHEN EXTRA STRENGTH 500 MG capsule SMARTSIG:2 Capsule(s) By Mouth Daily PRN     amLODipine (NORVASC) 10 MG tablet Take 1 tablet (10 mg total) by mouth daily. 90 tablet 1   bisoprolol (ZEBETA) 10 MG tablet Take 1 tablet (10 mg total) by mouth daily. 90 tablet 1   empagliflozin (JARDIANCE) 10 MG TABS tablet Take 1 tablet (10 mg total) by mouth daily before breakfast. 30 tablet 4   ferrous sulfate 325 (65 FE) MG tablet Take 325 mg by mouth in the morning.     fluticasone (VERAMYST) 27.5 MCG/SPRAY nasal spray Place 1 spray into the nose in the morning.     gabapentin (NEURONTIN) 300 MG capsule Take 1 capsule (300 mg total) by mouth at bedtime. 90 capsule 1    glipiZIDE (GLUCOTROL) 10 MG tablet Take 1.5 tablets (15 mg total) by mouth 2 (two) times daily before a meal. 270 tablet 2   hydrALAZINE (APRESOLINE) 50 MG tablet Take 1 tablet (50 mg total) by mouth 3 (three) times daily. 90 tablet 4   hydrochlorothiazide (HYDRODIURIL) 25 MG tablet Take 1 tablet (25 mg total) by mouth daily. 90 tablet 1   meloxicam (MOBIC) 15 MG tablet Take 1 tablet (15 mg total) by mouth daily as needed. 30 tablet 2   metFORMIN (GLUCOPHAGE) 1000 MG tablet Take 1 tablet (1,000 mg total) by mouth 2 (  two) times daily with a meal. 180 tablet 2   olmesartan (BENICAR) 40 MG tablet Take 1 tablet (40 mg total) by mouth daily. 90 tablet 3   QUEtiapine (SEROQUEL) 50 MG tablet Take 1 tablet (50 mg total) by mouth at bedtime. 30 tablet 2   sertraline (ZOLOFT) 100 MG tablet Take 1.5 tablets (150 mg total) by mouth daily. 45 tablet 2   simvastatin (ZOCOR) 20 MG tablet Take 1 tablet (20 mg total) by mouth at bedtime. 30 tablet 6   No current facility-administered medications for this visit.     Musculoskeletal: Strength & Muscle Tone: within normal limits Gait & Station: normal Patient leans: N/A  Psychiatric Specialty Exam: Review of Systems  Respiratory:  Negative for shortness of breath.   Cardiovascular:  Negative for chest pain.  Gastrointestinal:  Positive for constipation (due to restarting iron). Negative for abdominal pain, diarrhea, nausea and vomiting.  Neurological:  Negative for dizziness, weakness and headaches.  Psychiatric/Behavioral:  Positive for dysphoric mood. Negative for hallucinations, sleep disturbance and suicidal ideas. The patient is not nervous/anxious.     Blood pressure 137/79, pulse 60, height 5\' 6"  (1.676 m), weight 170 lb (77.1 kg), last menstrual period 04/27/2021, SpO2 98 %.Body mass index is 27.44 kg/m.  General Appearance: Casual and Fairly Groomed  Eye Contact:  Good  Speech:  Clear and Coherent and Normal Rate  Volume:  Normal  Mood:   Dysphoric  Affect:  Congruent  Thought Process:  Coherent and Goal Directed  Orientation:  Full (Time, Place, and Person)  Thought Content: WDL and Logical   Suicidal Thoughts:  No  Homicidal Thoughts:  No  Memory:  Immediate;   Good Recent;   Good  Judgement:  Good  Insight:  Good  Psychomotor Activity:  Normal  Concentration:  Concentration: Good and Attention Span: Good  Recall:  Good  Fund of Knowledge: Good  Language: Good  Akathisia:  Negative  Handed:  Right  AIMS (if indicated): not done   Assets:  Communication Skills Desire for Improvement Resilience Vocational/Educational  ADL's:  Intact  Cognition: WNL  Sleep:  Good   Screenings: AIMS    Flowsheet Row Clinical Support from 07/07/2022 in [facility]  AIMS Total Score 0      GAD-7    Flowsheet Row Office Visit from 08/04/2022 in [facility] Counselor from 07/06/2021 in [facility] Office Visit [facility]  Total GAD-7 Score 0 14 15      PHQ2-9    Flowsheet Row Office Visit from 08/04/2022 in [facility] Office Visit from 06/29/2021 in [facility] Office Visit from 02/17/2021 in [facility] Office Visit from 11/11/2020 in [facility]  PHQ-2 Total Score 1 2 2 2   PHQ-9 Total Score 1 4 11 10       Flowsheet Row Counselor from 07/06/2021 in [facility] ED to Hosp-Admission (Discharged) from 05/19/2021 in [facility] [place] [facility] Admission (Discharged) from 05/18/2021 in [facility]  C-SSRS RISK CATEGORY No Risk No Risk No Risk        Assessment and Plan:  Crystal Vasquez is a 52 yr old female who presents for Follow Up and Medication Management.  PPHx is  significant for  Depression, Anxiety, PTSD and possible ADHD, no history of Suicide Attempts, Self Injurious Behavior, or Psychiatric Hospitalizations.     Zyiah has had some additional stressors with work and passing of her friend's mother.  To address this we will increase her Zoloft at this time.  We will not make any other changes to her medications at this time.  She will return for follow-up in approximately 6 to 8 weeks.   MDD, Recurrent, Moderate  GAD  PTSD: -Increase Zoloft to 150 mg daily for depression and anxiety.  45 (100 mg) tablets with 2 refills. -Continue Seroquel 50 mg QHS for augmentation, mood stability, and insomnia.  30 tablets with 2 refills.    Collaboration of Care:   Patient/Guardian was advised Release of Information must be obtained prior to any record release in order to collaborate their care with an outside provider. Patient/Guardian was advised if they have not already done so to contact the registration department to sign all necessary forms in order for [place] to release information regarding their care.   Consent: Patient/Guardian gives verbal consent for treatment and assignment of benefits for services provided during this visit. Patient/Guardian expressed understanding and agreed to proceed.    Lauro Franklin, MD 09/11/2022, 8:28 AM

## 2022-09-12 ENCOUNTER — Encounter: Payer: Self-pay | Admitting: Hematology and Oncology

## 2022-09-12 ENCOUNTER — Other Ambulatory Visit: Payer: Self-pay

## 2022-09-13 ENCOUNTER — Other Ambulatory Visit: Payer: Self-pay

## 2022-09-13 ENCOUNTER — Encounter: Payer: Self-pay | Admitting: Hematology and Oncology

## 2022-09-14 ENCOUNTER — Other Ambulatory Visit: Payer: Self-pay

## 2022-09-14 ENCOUNTER — Other Ambulatory Visit (HOSPITAL_COMMUNITY): Payer: Self-pay

## 2022-09-15 ENCOUNTER — Other Ambulatory Visit: Payer: Self-pay

## 2022-10-04 ENCOUNTER — Encounter: Payer: Self-pay | Admitting: Hematology and Oncology

## 2022-10-04 ENCOUNTER — Other Ambulatory Visit: Payer: Self-pay

## 2022-10-06 ENCOUNTER — Other Ambulatory Visit: Payer: Self-pay

## 2022-10-10 ENCOUNTER — Other Ambulatory Visit: Payer: Self-pay

## 2022-10-16 ENCOUNTER — Other Ambulatory Visit: Payer: Self-pay

## 2022-10-17 ENCOUNTER — Other Ambulatory Visit: Payer: Self-pay

## 2022-10-30 ENCOUNTER — Encounter: Payer: Self-pay | Admitting: Hematology and Oncology

## 2022-10-30 ENCOUNTER — Other Ambulatory Visit: Payer: Self-pay

## 2022-10-31 ENCOUNTER — Other Ambulatory Visit: Payer: Self-pay

## 2022-11-03 ENCOUNTER — Encounter (HOSPITAL_COMMUNITY): Payer: No Payment, Other | Admitting: Student in an Organized Health Care Education/Training Program

## 2022-11-06 ENCOUNTER — Other Ambulatory Visit: Payer: Self-pay

## 2022-11-19 ENCOUNTER — Telehealth: Payer: Self-pay | Admitting: Nurse Practitioner

## 2022-11-19 DIAGNOSIS — J069 Acute upper respiratory infection, unspecified: Secondary | ICD-10-CM

## 2022-11-19 MED ORDER — ONDANSETRON HCL 4 MG PO TABS: 4.0000 mg | ORAL_TABLET | Freq: Three times a day (TID) | ORAL | 0 refills | Status: DC | PRN

## 2022-11-19 MED ORDER — FLUTICASONE PROPIONATE 50 MCG/ACT NA SUSP
2.0000 | Freq: Every day | NASAL | 0 refills | Status: DC
Start: 2022-11-19 — End: 2023-03-16

## 2022-11-19 NOTE — Progress Notes (Signed)
E-Visit for Upper Respiratory Infection   Unfortunately we are out of the window to prescribe an antiviral.  Here is how we plan to help!  Based on what you have shared with me, it looks like you may have a viral upper respiratory infection.  Upper respiratory infections are caused by a large number of viruses; however, rhinovirus is the most common cause.   Symptoms vary from person to person, with common symptoms including sore throat, cough, fatigue or lack of energy and feeling of general discomfort.  A low-grade fever of up to 100.4 may present, but is often uncommon.  Symptoms vary however, and are closely related to a person's age or underlying illnesses.  The most common symptoms associated with an upper respiratory infection are nasal discharge or congestion, cough, sneezing, headache and pressure in the ears and face.  These symptoms usually persist for about 3 to 10 days, but can last up to 2 weeks.  It is important to know that upper respiratory infections do not cause serious illness or complications in most cases.    Upper respiratory infections can be transmitted from person to person, with the most common method of transmission being a person's hands.  The virus is able to live on the skin and can infect other persons for up to 2 hours after direct contact.  Also, these can be transmitted when someone coughs or sneezes; thus, it is important to cover the mouth to reduce this risk.  To keep the spread of the illness at bay, good hand hygiene is very important.  This is an infection that is most likely caused by a virus. There are no specific treatments other than to help you with the symptoms until the infection runs its course.  We are sorry you are not feeling well.  Here is how we plan to help!   For nasal congestion, you may use an oral decongestants such as Mucinex D or if you have glaucoma or high blood pressure use plain Mucinex.  Saline nasal spray or nasal drops can help and  can safely be used as often as needed for congestion.  For your congestion, I have prescribed Fluticasone nasal spray one spray in each nostril twice a day  If you do not have a history of heart disease, hypertension, diabetes or thyroid disease, prostate/bladder issues or glaucoma, you may also use Sudafed to treat nasal congestion.  It is highly recommended that you consult with a pharmacist or your primary care physician to ensure this medication is safe for you to take.     If you have a cough, you may use cough suppressants such as Delsym and Robitussin.  If you have glaucoma or high blood pressure, you can also use Coricidin HBP.   For nausea I have sent zofran to the pharmacy.   If you have a sore or scratchy throat, use a saltwater gargle-  to  teaspoon of salt dissolved in a 4-ounce to 8-ounce glass of warm water.  Gargle the solution for approximately 15-30 seconds and then spit.  It is important not to swallow the solution.  You can also use throat lozenges/cough drops and Chloraseptic spray to help with throat pain or discomfort.  Warm or cold liquids can also be helpful in relieving throat pain.  For headache, pain or general discomfort, you can use Ibuprofen or Tylenol as directed.   Some authorities believe that zinc sprays or the use of Echinacea may shorten the course of your symptoms.  HOME CARE Only take medications as instructed by your medical team. Be sure to drink plenty of fluids. Water is fine as well as fruit juices, sodas and electrolyte beverages. You may want to stay away from caffeine or alcohol. If you are nauseated, try taking small sips of liquids. How do you know if you are getting enough fluid? Your urine should be a pale yellow or almost colorless. Get rest. Taking a steamy shower or using a humidifier may help nasal congestion and ease sore throat pain. You can place a towel over your head and breathe in the steam from hot water coming from a faucet. Using a  saline nasal spray works much the same way. Cough drops, hard candies and sore throat lozenges may ease your cough. Avoid close contacts especially the very young and the elderly Cover your mouth if you cough or sneeze Always remember to wash your hands.   GET HELP RIGHT AWAY IF: You develop worsening fever. If your symptoms do not improve within 10 days You develop yellow or green discharge from your nose over 3 days. You have coughing fits You develop a severe head ache or visual changes. You develop shortness of breath, difficulty breathing or start having chest pain Your symptoms persist after you have completed your treatment plan  MAKE SURE YOU  Understand these instructions. Will watch your condition. Will get help right away if you are not doing well or get worse.  Thank you for choosing an e-visit.  Your e-visit answers were reviewed by a board certified advanced clinical practitioner to complete your personal care plan. Depending upon the condition, your plan could have included both over the counter or prescription medications.  Please review your pharmacy choice. Make sure the pharmacy is open so you can pick up prescription now. If there is a problem, you may contact your provider through [employer] and have the prescription routed to another pharmacy.  Your safety is important to [place]. If you have drug allergies check your prescription carefully.   For the next 24 hours you can use MyChart to ask questions about today's visit, request a non-urgent call back, or ask for a work or school excuse. You will get an email in the next two days asking about your experience. I hope that your e-visit has been valuable and will speed your recovery.

## 2022-11-19 NOTE — Progress Notes (Signed)
I have spent 5 minutes in review of e-visit questionnaire, review and updating patient chart, medical decision making and response to patient.  ° °Zelda W Fleming, NP ° °  °

## 2022-12-01 IMAGING — DX DG SHOULDER 2+V*L*
3 series · 3 of 3 positions shown · non-contrast
Comparison: None.

CLINICAL DATA: 50-year-old female with history of shoulder pain for
5 months.

EXAM:
LEFT SHOULDER - 2+ VIEW

[shoulder grashey]
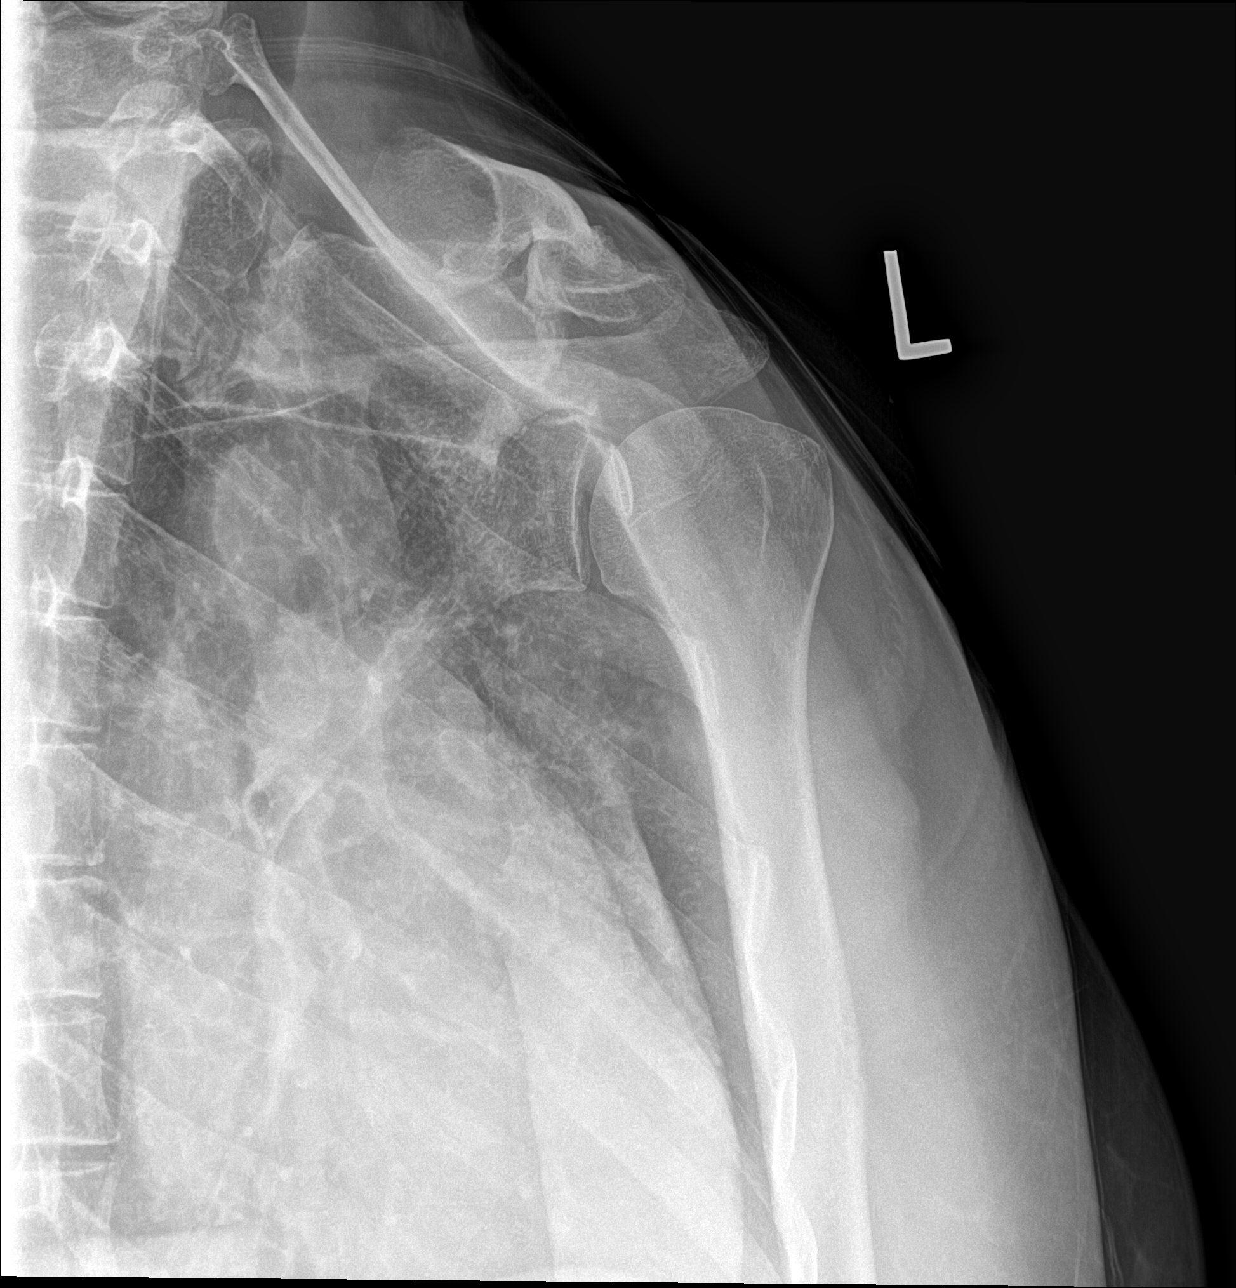

[shoulder y view]
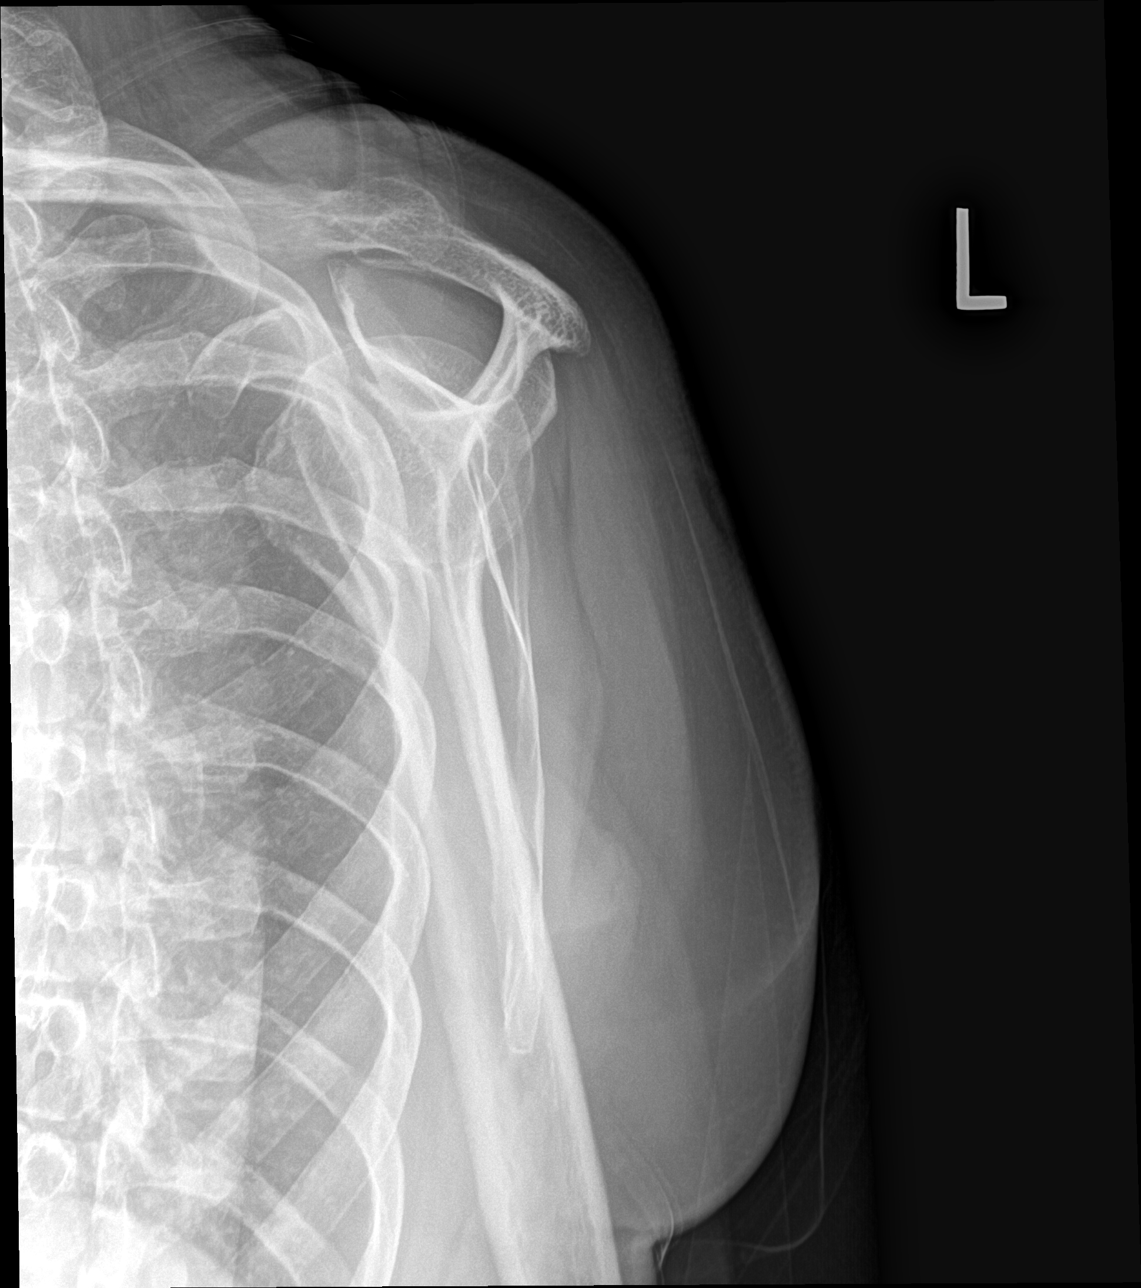

[shoulder axillary]
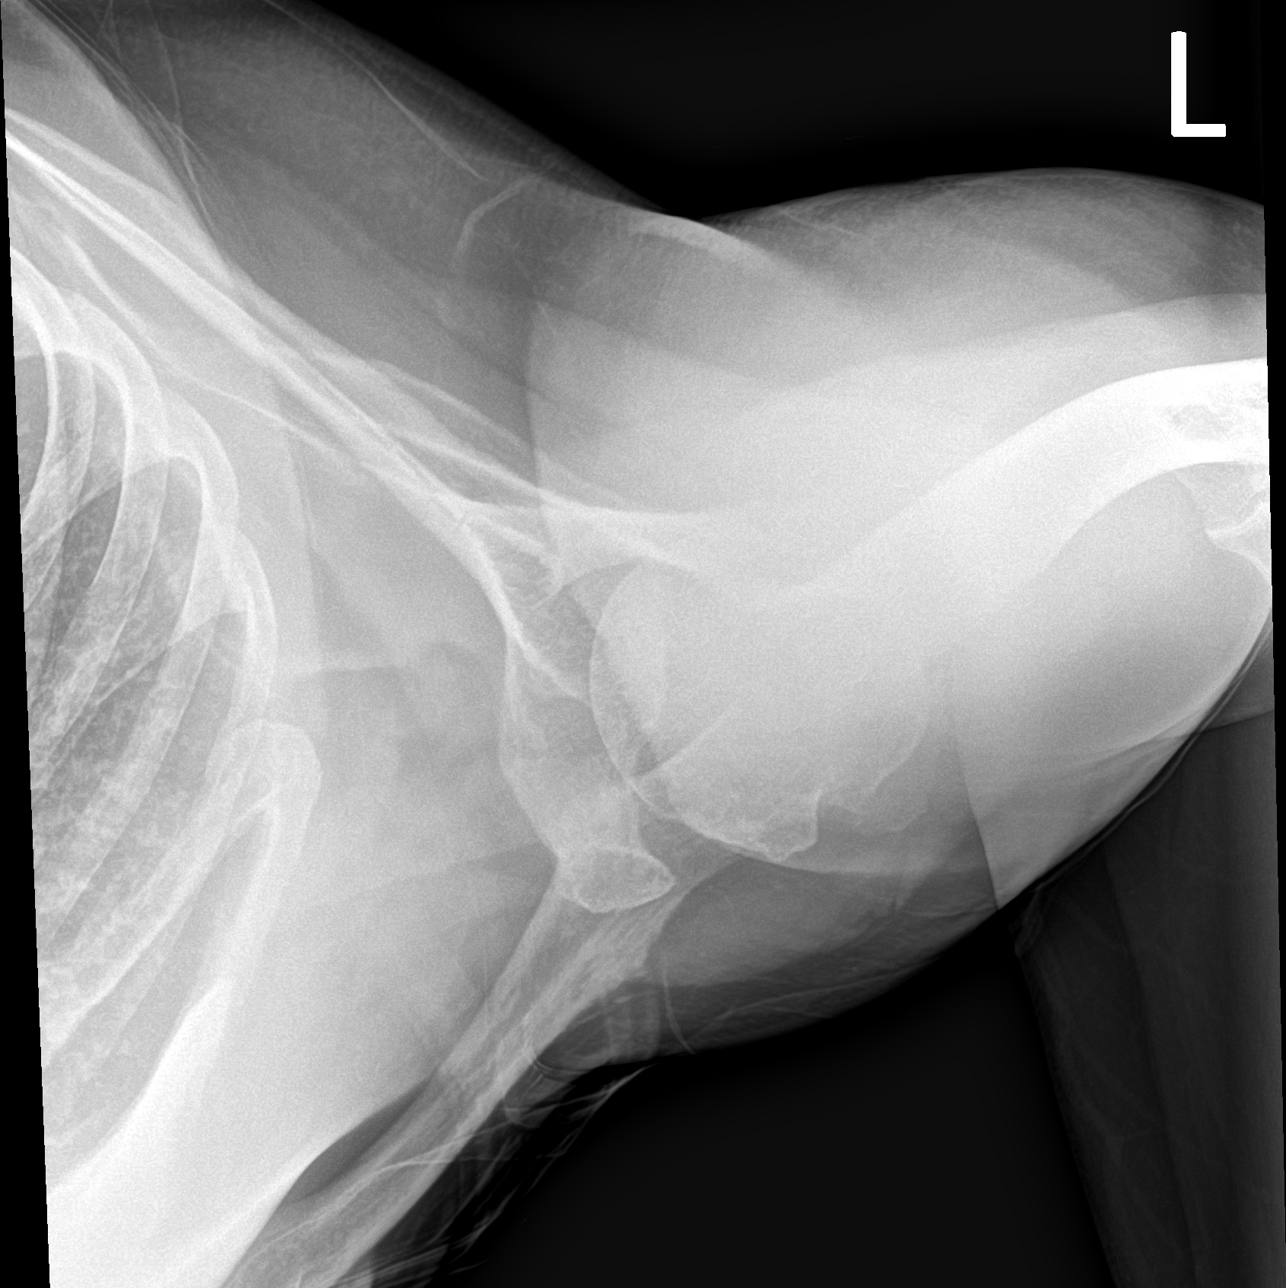

[3 of 3 positions shown; findings below may reference images not displayed]

FINDINGS: There is no evidence of fracture or dislocation. There is no
evidence of arthropathy or other focal bone abnormality. Soft
tissues are unremarkable.
IMPRESSION: No acute fracture, malalignment, or significant degenerative change.

## 2022-12-07 ENCOUNTER — Other Ambulatory Visit: Payer: Self-pay

## 2022-12-07 ENCOUNTER — Encounter: Payer: Self-pay | Admitting: Hematology and Oncology

## 2022-12-08 ENCOUNTER — Ambulatory Visit (INDEPENDENT_AMBULATORY_CARE_PROVIDER_SITE_OTHER): Payer: No Payment, Other | Admitting: Student in an Organized Health Care Education/Training Program

## 2022-12-08 ENCOUNTER — Other Ambulatory Visit (HOSPITAL_COMMUNITY): Payer: Self-pay

## 2022-12-08 ENCOUNTER — Other Ambulatory Visit: Payer: Self-pay

## 2022-12-08 ENCOUNTER — Encounter (HOSPITAL_COMMUNITY): Payer: Self-pay | Admitting: Student in an Organized Health Care Education/Training Program

## 2022-12-08 ENCOUNTER — Encounter: Payer: Self-pay | Admitting: Hematology and Oncology

## 2022-12-08 DIAGNOSIS — F411 Generalized anxiety disorder: Secondary | ICD-10-CM

## 2022-12-08 DIAGNOSIS — F331 Major depressive disorder, recurrent, moderate: Secondary | ICD-10-CM | POA: Diagnosis not present

## 2022-12-08 DIAGNOSIS — F4310 Post-traumatic stress disorder, unspecified: Secondary | ICD-10-CM

## 2022-12-08 MED ORDER — QUETIAPINE FUMARATE 50 MG PO TABS
50.0000 mg | ORAL_TABLET | Freq: Every day | ORAL | 2 refills | Status: DC
Start: 2022-12-08 — End: 2023-03-09
  Filled 2022-12-08 – 2023-01-04 (×2): qty 30, 30d supply, fill #0
  Filled 2023-03-03 – 2023-03-05 (×2): qty 30, 30d supply, fill #1

## 2022-12-08 MED ORDER — SERTRALINE HCL 100 MG PO TABS
150.0000 mg | ORAL_TABLET | Freq: Every day | ORAL | 2 refills | Status: DC
Start: 2022-12-08 — End: 2023-03-09
  Filled 2022-12-08 – 2023-01-04 (×2): qty 45, 30d supply, fill #0
  Filled 2023-03-03 – 2023-03-05 (×2): qty 45, 30d supply, fill #1

## 2022-12-08 NOTE — Progress Notes (Signed)
BH MD/PA/NP OP Progress Note  12/08/2022 12:11 PM Crystal Vasquez  MRN:  [id number]  Chief Complaint:  Chief Complaint  Patient presents with   Follow-up   Depression   Anxiety   HPI:  Crystal Vasquez is a 52 yr old female who presents for Follow Up and Medication Management.  PPHx is significant for Depression, Anxiety, PTSD and possible ADHD, no history of Suicide Attempts, Self Injurious Behavior, or Psychiatric Hospitalizations.    She reports that she has been having to think about a lot of tough decisions recently.  She reports that she has been offered full time at [employer] and is now to choose which store she will go to.  She reports she has decided to stay in [place] which means she is now having to deal with getting her drivers license and car registration moved from [place] to [place] which is been a hassle.  She reports he is also had to do some processing about how she is looking more and more like her sister as she ages (sister completed suicide).  She reports that despite all of the stressors she has been able to handle stressors at work well which has been noticed by coworkers and commented on.  Discussed with her that since she is navigating significant stressors well we would not make any changes to her medications at this time and she was agreeable with this.  She reports no side effects to her medications.  She reports no SI, HI, or AVH.  She reports her sleep is good.  She reports her appetite is mostly good but can vary a lot due to her IBS.  She reports no other concerns are present.  She will return for follow-up in approximately 2 to 3 months.   Visit Diagnosis:    ICD-10-CM   1. MDD (major depressive disorder), recurrent episode, moderate (HCC)  F33.1 sertraline (ZOLOFT) 100 MG tablet    QUEtiapine (SEROQUEL) 50 MG tablet    2. Generalized anxiety disorder  F41.1 sertraline (ZOLOFT) 100 MG tablet    QUEtiapine (SEROQUEL) 50 MG tablet    3. PTSD  (post-traumatic stress disorder)  F43.10 sertraline (ZOLOFT) 100 MG tablet    QUEtiapine (SEROQUEL) 50 MG tablet       Past Psychiatric History: Depression, Anxiety, PTSD and possible ADHD, no history of Suicide Attempts, Self Injurious Behavior, or Psychiatric Hospitalizations.    Past Medical History:  Past Medical History:  Diagnosis Date   Anemia    Asthma    due to acid reflux   Depression    DM2    Dyspnea    GERD (gastroesophageal reflux disease)    Headache    Heart murmur    benign   Hypertension    Obesity (BMI 30-39.9)    Thyroid disease     Past Surgical History:  Procedure Laterality Date   ABDOMINAL HYSTERECTOMY     BIOPSY THYROID     IR ANGIOGRAM PELVIS SELECTIVE OR SUPRASELECTIVE  05/09/2021   IR ANGIOGRAM SELECTIVE EACH ADDITIONAL VESSEL  05/09/2021   IR AORTAGRAM ABDOMINAL SERIALOGRAM  05/09/2021   IR EMBO TUMOR ORGAN ISCHEMIA INFARCT INC GUIDE ROADMAPPING  05/09/2021   IR RADIOLOGIST EVAL & MGMT  04/11/2021   IR US GUIDE VASC ACCESS LEFT  05/09/2021   ROBOTIC ASSISTED LAPAROSCOPIC HYSTERECTOMY AND SALPINGECTOMY Bilateral 05/18/2021   Procedure: XI ROBOTIC ASSISTED LAPAROSCOPIC HYSTERECTOMY AND SALPINGECTOMY, UTERUS MORE THAN 250 GRAMS;  Surgeon: Carver Fila, MD;  Location: [facility];  Service: Gynecology;  Laterality: Bilateral;    Family Psychiatric History: Mother- Bipolar Disorder, Suicide 61 (OD) Sister- Schizophrenia, Suicide 2010 (OD) Brother- Bipolar Disorder, EtOH Abuse, Polysubstance Abuse  Family History:  Family History  Problem Relation Age of Onset   Diabetes Mother    Hypertension Mother    Diabetes Father    Hypertension Father    Endometrial cancer Maternal Aunt    Colon cancer Neg Hx    Breast cancer Neg Hx    Ovarian cancer Neg Hx    Pancreatic cancer Neg Hx    Prostate cancer Neg Hx     Social History:  Social History   Socioeconomic History   Marital status: Single    Spouse name: Not on file   Number of  children: Not on file   Years of education: Not on file   Highest education level: Not on file  Occupational History   Not on file  Tobacco Use   Smoking status: Never    Passive exposure: Never   Smokeless tobacco: Never  Vaping Use   Vaping status: Never Used  Substance and Sexual Activity   Alcohol use: Not Currently   Drug use: Never   Sexual activity: Not Currently  Other Topics Concern   Not on file  Social History Narrative   Not on file   Social Determinants of Health   Financial Resource Strain: High Risk (10/12/2021)   Overall Financial Resource Strain (CARDIA)    Difficulty of Paying Living Expenses: Very hard  Food Insecurity: Food Insecurity Present (10/12/2021)   Hunger Vital Sign    Worried About Running Out of Food in the Last Year: Often true    Ran Out of Food in the Last Year: Often true  Transportation Needs: Unmet Transportation Needs (10/12/2021)   PRAPARE - Administrator, Civil Service (Medical): Yes    Lack of Transportation (Non-Medical): Yes  Physical Activity: Not on file  Stress: Not on file  Social Connections: Not on file    Allergies: No Known Allergies  Metabolic Disorder Labs: Lab Results  Component Value Date   HGBA1C 8.3 (A) 08/04/2022   No results found for: "PROLACTIN" No results found for: "CHOL", "TRIG", "HDL", "CHOLHDL", "VLDL", "LDLCALC" Lab Results  Component Value Date   TSH 0.28 (L) 08/21/2022   TSH 0.21 (L) 02/20/2022    Therapeutic Level Labs: No results found for: "LITHIUM" No results found for: "VALPROATE" No results found for: "CBMZ"  Current Medications: Current Outpatient Medications  Medication Sig Dispense Refill   ACETAMINOPHEN EXTRA STRENGTH 500 MG capsule SMARTSIG:2 Capsule(s) By Mouth Daily PRN     amLODipine (NORVASC) 10 MG tablet Take 1 tablet (10 mg total) by mouth daily. 90 tablet 1   bisoprolol (ZEBETA) 10 MG tablet Take 1 tablet (10 mg total) by mouth daily. 90 tablet 1    empagliflozin (JARDIANCE) 10 MG TABS tablet Take 1 tablet (10 mg total) by mouth daily before breakfast. 30 tablet 4   ferrous sulfate 325 (65 FE) MG tablet Take 325 mg by mouth in the morning.     fluticasone (FLONASE) 50 MCG/ACT nasal spray Place 2 sprays into both nostrils daily. 16 g 0   fluticasone (VERAMYST) 27.5 MCG/SPRAY nasal spray Place 1 spray into the nose in the morning.     gabapentin (NEURONTIN) 300 MG capsule Take 1 capsule (300 mg total) by mouth at bedtime. 90 capsule 1   glipiZIDE (GLUCOTROL) 10 MG tablet Take 1.5 tablets (15 mg  total) by mouth 2 (two) times daily before a meal. 270 tablet 2   hydrALAZINE (APRESOLINE) 50 MG tablet Take 1 tablet (50 mg total) by mouth 3 (three) times daily. 90 tablet 4   hydrochlorothiazide (HYDRODIURIL) 25 MG tablet Take 1 tablet (25 mg total) by mouth daily. 90 tablet 1   meloxicam (MOBIC) 15 MG tablet Take 1 tablet (15 mg total) by mouth daily as needed. 30 tablet 2   metFORMIN (GLUCOPHAGE) 1000 MG tablet Take 1 tablet (1,000 mg total) by mouth 2 (two) times daily with a meal. 180 tablet 2   olmesartan (BENICAR) 40 MG tablet Take 1 tablet (40 mg total) by mouth daily. 90 tablet 3   ondansetron (ZOFRAN) 4 MG tablet Take 1 tablet (4 mg total) by mouth every 8 (eight) hours as needed for nausea or vomiting. 21 tablet 0   QUEtiapine (SEROQUEL) 50 MG tablet Take 1 tablet (50 mg total) by mouth at bedtime. 30 tablet 2   sertraline (ZOLOFT) 100 MG tablet Take 1.5 tablets (150 mg total) by mouth daily. 45 tablet 2   simvastatin (ZOCOR) 20 MG tablet Take 1 tablet (20 mg total) by mouth at bedtime. 30 tablet 6   No current facility-administered medications for this visit.     Musculoskeletal: Strength & Muscle Tone: within normal limits Gait & Station: normal Patient leans: N/A  Psychiatric Specialty Exam: Review of Systems  Respiratory:  Negative for shortness of breath.   Cardiovascular:  Negative for chest pain.  Gastrointestinal:  Negative  for abdominal pain, constipation, diarrhea, nausea and vomiting.  Neurological:  Negative for dizziness, weakness and headaches.  Psychiatric/Behavioral:  Negative for dysphoric mood, hallucinations and suicidal ideas. The patient is not nervous/anxious.     Blood pressure 136/78, pulse 60, height 5\' 6"  (1.676 m), weight 173 lb (78.5 kg), last menstrual period 04/27/2021, SpO2 100%.Body mass index is 27.92 kg/m.  General Appearance: Casual and Fairly Groomed  Eye Contact:  Good  Speech:  Clear and Coherent and Normal Rate  Volume:  Normal  Mood:   "ok"  Affect:  Appropriate and Congruent  Thought Process:  Coherent and Goal Directed  Orientation:  Full (Time, Place, and Person)  Thought Content: WDL and Logical   Suicidal Thoughts:  No  Homicidal Thoughts:  No  Memory:  Immediate;   Good Recent;   Good  Judgement:  Good  Insight:  Good  Psychomotor Activity:  Normal  Concentration:  Concentration: Good and Attention Span: Good  Recall:  Good  Fund of Knowledge: Good  Language: Good  Akathisia:  Negative  Handed:  Right  AIMS (if indicated): not done   Assets:  Communication Skills Desire for Improvement Resilience Vocational/Educational  ADL's:  Intact  Cognition: WNL  Sleep:  Good   Screenings: AIMS    Flowsheet Row Clinical Support from 07/07/2022 in [facility]  AIMS Total Score 0      GAD-7    Flowsheet Row Office Visit from 08/04/2022 in [facility] Counselor from 07/06/2021 in [facility] Office Visit from [facility]  Total GAD-7 Score 0 14 15      PHQ2-9    Flowsheet Row Office Visit from 08/04/2022 in [facility] Office Visit from 06/29/2021 in [facility] Office Visit from 02/17/2021 in [facility]  Office  Visit from 11/11/2020 in [facility]  PHQ-2 Total Score 1 2 2 2   PHQ-9 Total Score 1 4 11 10       Flowsheet Row Counselor from 07/06/2021 in [facility] ED to Hosp-Admission (Discharged) from 05/19/2021 in [facility] [place] [facility] Admission (Discharged) from 05/18/2021 in [place] [facility] PERIOPERATIVE AREA  C-SSRS RISK CATEGORY No Risk No Risk No Risk        Assessment and Plan:  Crystal Vasquez is a 52 yr old female who presents for Follow Up and Medication Management.  PPHx is significant for Depression, Anxiety, PTSD and possible ADHD, no history of Suicide Attempts, Self Injurious Behavior, or Psychiatric Hospitalizations.     Rickia has done well with the increase in Zoloft.  She has had multiple significant stressors but managed to navigate them well.  Due to this we will not make any changes to her medications at this time.  Refills were sent in.  She will return for follow-up in approximately 2 to 3 months.   MDD, Recurrent, Moderate  GAD  PTSD: -Continue Zoloft 150 mg daily for depression and anxiety.  45 (100 mg) tablets with 2 refills. -Continue Seroquel 50 mg QHS for augmentation, mood stability, and insomnia.  30 tablets with 2 refills.    Collaboration of Care:   Patient/Guardian was advised Release of Information must be obtained prior to any record release in order to collaborate their care with an outside provider. Patient/Guardian was advised if they have not already done so to contact the registration department to sign all necessary forms in order for [place] to release information regarding their care.   Consent: Patient/Guardian gives verbal consent for treatment and assignment of benefits for services provided during this visit. Patient/Guardian expressed understanding and agreed to proceed.    Lauro Franklin, MD 12/08/2022, 12:11 PM

## 2022-12-13 ENCOUNTER — Encounter: Payer: Self-pay | Admitting: Hematology and Oncology

## 2023-01-04 ENCOUNTER — Encounter: Payer: Self-pay | Admitting: Hematology and Oncology

## 2023-01-04 ENCOUNTER — Other Ambulatory Visit: Payer: Self-pay

## 2023-01-05 ENCOUNTER — Other Ambulatory Visit: Payer: Self-pay

## 2023-01-11 ENCOUNTER — Other Ambulatory Visit: Payer: Self-pay

## 2023-01-11 ENCOUNTER — Ambulatory Visit: Payer: Self-pay | Attending: Internal Medicine | Admitting: Internal Medicine

## 2023-01-11 ENCOUNTER — Encounter: Payer: Self-pay | Admitting: Hematology and Oncology

## 2023-01-11 VITALS — BP 136/84 | HR 61 | Ht 66.0 in | Wt 178.0 lb

## 2023-01-11 DIAGNOSIS — Z1211 Encounter for screening for malignant neoplasm of colon: Secondary | ICD-10-CM

## 2023-01-11 DIAGNOSIS — M5412 Radiculopathy, cervical region: Secondary | ICD-10-CM

## 2023-01-11 DIAGNOSIS — M62838 Other muscle spasm: Secondary | ICD-10-CM

## 2023-01-11 DIAGNOSIS — E119 Type 2 diabetes mellitus without complications: Secondary | ICD-10-CM

## 2023-01-11 DIAGNOSIS — E1159 Type 2 diabetes mellitus with other circulatory complications: Secondary | ICD-10-CM

## 2023-01-11 DIAGNOSIS — E1169 Type 2 diabetes mellitus with other specified complication: Secondary | ICD-10-CM

## 2023-01-11 DIAGNOSIS — I152 Hypertension secondary to endocrine disorders: Secondary | ICD-10-CM

## 2023-01-11 DIAGNOSIS — Z7984 Long term (current) use of oral hypoglycemic drugs: Secondary | ICD-10-CM

## 2023-01-11 DIAGNOSIS — Z5900 Homelessness unspecified: Secondary | ICD-10-CM

## 2023-01-11 DIAGNOSIS — E785 Hyperlipidemia, unspecified: Secondary | ICD-10-CM

## 2023-01-11 LAB — POCT GLYCOSYLATED HEMOGLOBIN (HGB A1C): HbA1c, POC (controlled diabetic range): 7.7 % — AB (ref 0.0–7.0)

## 2023-01-11 LAB — GLUCOSE, POCT (MANUAL RESULT ENTRY): POC Glucose: 202 mg/dL — AB (ref 70–99)

## 2023-01-11 MED ORDER — GLIPIZIDE 10 MG PO TABS
15.0000 mg | ORAL_TABLET | Freq: Two times a day (BID) | ORAL | 2 refills | Status: DC
Start: 2023-01-11 — End: 2023-03-29
  Filled 2023-01-11: qty 270, 90d supply, fill #0

## 2023-01-11 MED ORDER — CYCLOBENZAPRINE HCL 5 MG PO TABS
5.0000 mg | ORAL_TABLET | Freq: Two times a day (BID) | ORAL | 0 refills | Status: DC | PRN
Start: 2023-01-11 — End: 2023-03-29
  Filled 2023-01-11: qty 30, 15d supply, fill #0

## 2023-01-11 NOTE — Progress Notes (Signed)
Patient ID: Crystal Vasquez, female    DOB: Nov 12, 1970  MRN: [id number]  CC: Pain (Pain on lower back of neck X1 mo - tingling radiating to both arms & legs/Discuss glipizide - unsure if still needs to take)   Subjective: Crystal Vasquez is a 52 y.o. female who presents for chronic ds management. Her concerns today include:  Patient with history of HTN, DM type II, HL, MDD/GAD/PTSD/possible ADHD followed by psychiatry, Thyroid ds   C/o pain at back of neck and into trapezius muscle bilaterally x 1-1/2 months.   Tingling in hands LT>RT, endorses numbness in LT hand that is intermittent x few wks and constant in RT hand of the middle finger x 6 mths Pain travels down the back LT side to where the muscles feel tight Feels all due to stress and because she is looking down on phone/computer a lot during the day Worse when stress is high. Stress mainly at work.  Works for [employer]  Gabapentin which she does not take every day but started taking more regularly and Ibuprofen 200 mg 2 tabs daily offer some relief.    DM: Results for orders placed or performed in visit on 01/11/23  POCT glycosylated hemoglobin (Hb A1C)  Result Value Ref Range   Hemoglobin A1C     HbA1c POC (<> result, manual entry)     HbA1c, POC (prediabetic range)     HbA1c, POC (controlled diabetic range) 7.7 (A) 0.0 - 7.0 %  POCT glucose (manual entry)  Result Value Ref Range   POC Glucose 202 (A) 70 - 99 mg/dl  U2V improved.  Saw Dr. Brooks Sailors in June.  Glipizide increased to 10 mg 1/2 tablets twice a day, kept on metformin 1 g twice a day and Jardiance prescribed.  She was not able to afford the Jardiance.  Did not qualify to get it through the patient assistance.  Taking metformin and glipizide consistently. No eye exam as yet.  She will look into it.  HTN:  taking meds consistently but forgot to take this morning.  hydralazine 50 TID bisoprolol 10 mg daily, amlodipine 10 mg daily, HCTZ 25 mg daily, Benicar 40 mg  daily   She continues to live out of her car.  HM: Had flu shot already through work.  Reports she never received the fit kit for colon cancer screening.  Willing to do that today. Patient Active Problem List   Diagnosis Date Noted   Subclinical hyperthyroidism 02/20/2022   Hyperlipidemia associated with type 2 diabetes mellitus (HCC) 01/24/2022   Excessive daytime sleepiness 10/31/2021   Snoring 10/31/2021   Fever postop 05/19/2021   Uterine polyp 05/09/2021   Hypertension associated with type 2 diabetes mellitus (HCC) 12/03/2020   Uncontrolled type 2 diabetes mellitus with hyperglycemia (HCC) 12/03/2020   Uterine mass 12/03/2020   Abnormal uterine bleeding (AUB) 11/11/2020   History of uterine fibroid 11/11/2020   Iron deficiency anemia due to chronic blood loss 11/11/2020   History of menorrhagia 11/11/2020     Current Outpatient Medications on File Prior to Visit  Medication Sig Dispense Refill   ACETAMINOPHEN EXTRA STRENGTH 500 MG capsule SMARTSIG:2 Capsule(s) By Mouth Daily PRN     amLODipine (NORVASC) 10 MG tablet Take 1 tablet (10 mg total) by mouth daily. 90 tablet 1   bisoprolol (ZEBETA) 10 MG tablet Take 1 tablet (10 mg total) by mouth daily. 90 tablet 1   ferrous sulfate 325 (65 FE) MG tablet Take 325 mg by mouth  in the morning.     fluticasone (FLONASE) 50 MCG/ACT nasal spray Place 2 sprays into both nostrils daily. 16 g 0   fluticasone (VERAMYST) 27.5 MCG/SPRAY nasal spray Place 1 spray into the nose in the morning.     gabapentin (NEURONTIN) 300 MG capsule Take 1 capsule (300 mg total) by mouth at bedtime. 90 capsule 1   hydrALAZINE (APRESOLINE) 50 MG tablet Take 1 tablet (50 mg total) by mouth 3 (three) times daily. 90 tablet 4   hydrochlorothiazide (HYDRODIURIL) 25 MG tablet Take 1 tablet (25 mg total) by mouth daily. 90 tablet 1   meloxicam (MOBIC) 15 MG tablet Take 1 tablet (15 mg total) by mouth daily as needed. 30 tablet 2   metFORMIN (GLUCOPHAGE) 1000 MG tablet  Take 1 tablet (1,000 mg total) by mouth 2 (two) times daily with a meal. 180 tablet 2   olmesartan (BENICAR) 40 MG tablet Take 1 tablet (40 mg total) by mouth daily. 90 tablet 3   ondansetron (ZOFRAN) 4 MG tablet Take 1 tablet (4 mg total) by mouth every 8 (eight) hours as needed for nausea or vomiting. 21 tablet 0   QUEtiapine (SEROQUEL) 50 MG tablet Take 1 tablet (50 mg total) by mouth at bedtime. 30 tablet 2   sertraline (ZOLOFT) 100 MG tablet Take 1.5 tablets (150 mg total) by mouth daily. 45 tablet 2   simvastatin (ZOCOR) 20 MG tablet Take 1 tablet (20 mg total) by mouth at bedtime. 30 tablet 6   empagliflozin (JARDIANCE) 10 MG TABS tablet Take 1 tablet (10 mg total) by mouth daily before breakfast. (Patient not taking: Reported on 01/11/2023) 30 tablet 4   glipiZIDE (GLUCOTROL) 10 MG tablet Take 1.5 tablets (15 mg total) by mouth 2 (two) times daily before a meal. (Patient not taking: Reported on 01/11/2023) 270 tablet 2   No current facility-administered medications on file prior to visit.    No Known Allergies  Social History   Socioeconomic History   Marital status: Single    Spouse name: Not on file   Number of children: Not on file   Years of education: Not on file   Highest education level: Associate degree: occupational, Scientist, product/process development, or vocational program  Occupational History   Not on file  Tobacco Use   Smoking status: Never    Passive exposure: Never   Smokeless tobacco: Never  Vaping Use   Vaping status: Never Used  Substance and Sexual Activity   Alcohol use: Not Currently   Drug use: Never   Sexual activity: Not Currently  Other Topics Concern   Not on file  Social History Narrative   Not on file   Social Determinants of Health   Financial Resource Strain: Medium Risk (01/11/2023)   Overall Financial Resource Strain (CARDIA)    Difficulty of Paying Living Expenses: Somewhat hard  Food Insecurity: Food Insecurity Present (01/11/2023)   Hunger Vital Sign     Worried About Running Out of Food in the Last Year: Sometimes true    Ran Out of Food in the Last Year: Sometimes true  Transportation Needs: No Transportation Needs (01/11/2023)   PRAPARE - Administrator, Civil Service (Medical): No    Lack of Transportation (Non-Medical): No  Physical Activity: Unknown (01/11/2023)   Exercise Vital Sign    Days of Exercise per Week: 0 days    Minutes of Exercise per Session: Not on file  Stress: Stress Concern Present (01/11/2023)   [employer] of Occupational Health -  Occupational Stress Questionnaire    Feeling of Stress : Rather much  Social Connections: Unknown (01/11/2023)   Social Connection and Isolation Panel [NHANES]    Frequency of Communication with Friends and Family: Patient declined    Frequency of Social Gatherings with Friends and Family: Patient declined    Attends Religious Services: Patient declined    Database administrator or Organizations: Patient declined    Attends Engineer, structural: Not on file    Marital Status: Patient declined  Catering manager Violence: Not on file    Family History  Problem Relation Age of Onset   Diabetes Mother    Hypertension Mother    Diabetes Father    Hypertension Father    Endometrial cancer Maternal Aunt    Colon cancer Neg Hx    Breast cancer Neg Hx    Ovarian cancer Neg Hx    Pancreatic cancer Neg Hx    Prostate cancer Neg Hx     Past Surgical History:  Procedure Laterality Date   ABDOMINAL HYSTERECTOMY     BIOPSY THYROID     IR ANGIOGRAM PELVIS SELECTIVE OR SUPRASELECTIVE  05/09/2021   IR ANGIOGRAM SELECTIVE EACH ADDITIONAL VESSEL  05/09/2021   IR AORTAGRAM ABDOMINAL SERIALOGRAM  05/09/2021   IR EMBO TUMOR ORGAN ISCHEMIA INFARCT INC GUIDE ROADMAPPING  05/09/2021   IR RADIOLOGIST EVAL & MGMT  04/11/2021   IR US GUIDE VASC ACCESS LEFT  05/09/2021   ROBOTIC ASSISTED LAPAROSCOPIC HYSTERECTOMY AND SALPINGECTOMY Bilateral 05/18/2021   Procedure: XI  ROBOTIC ASSISTED LAPAROSCOPIC HYSTERECTOMY AND SALPINGECTOMY, UTERUS MORE THAN 250 GRAMS;  Surgeon: Carver Fila, MD;  Location: [facility];  Service: Gynecology;  Laterality: Bilateral;    ROS: Review of Systems Negative except as stated above  PHYSICAL EXAM: BP 136/84 (BP Location: Left Arm, Patient Position: Sitting, Cuff Size: Normal)   Pulse 61   Ht 5\' 6"  (1.676 m)   Wt 178 lb (80.7 kg)   LMP 04/27/2021 (Exact Date)   SpO2 100%   BMI 28.73 kg/m   Physical Exam BP 148/84 General appearance - alert, well appearing, and in no distress Mental status - normal mood, behavior, speech, dress, motor activity, and thought processes Neck - supple, no significant adenopathy Chest - clear to auscultation, no wheezes, rales or rhonchi, symmetric air entry Heart - normal rate, regular rhythm, normal S1, S2, no murmurs, rubs, clicks or gallops Neurological -power in the upper extremities 5/5 bilaterally proximally and distally.  Grip is 5/5.  Gross sensation intact. Musculoskeletal -mild tenderness on palpation over the cervical spine and trapezius muscles on both sides. Extremities -no lower extremity edema.      Latest Ref Rng & Units 10/27/2021    9:56 AM 05/23/2021    4:09 AM 05/22/2021    4:15 AM  CMP  Glucose 70 - 99 mg/dL 657  846  962   BUN 6 - 20 mg/dL 16  10  9    Creatinine 0.44 - 1.00 mg/dL 9.52  8.41  3.24   Sodium 135 - 145 mmol/L 139  137  135   Potassium 3.5 - 5.1 mmol/L 3.3  4.4  3.9   Chloride 98 - 111 mmol/L 101  104  105   CO2 22 - 32 mmol/L 31  22  22    Calcium 8.9 - 10.3 mg/dL 9.5  9.0  8.5   Total Protein 6.5 - 8.1 g/dL 7.7     Total Bilirubin 0.3 - 1.2 mg/dL 0.2  Alkaline Phos 38 - 126 U/L 60     AST 15 - 41 U/L 16     ALT 0 - 44 U/L 19      Lipid Panel  No results found for: "CHOL", "TRIG", "HDL", "CHOLHDL", "VLDL", "LDLCALC", "LDLDIRECT"  CBC    Component Value Date/Time   WBC 4.5 10/27/2021 0956   WBC 5.4 05/23/2021 0409   RBC 4.28 10/27/2021  0956   RBC 4.27 10/27/2021 0955   HGB 13.1 10/27/2021 0956   HGB 9.3 (L) 11/11/2020 1545   HCT 37.7 10/27/2021 0956   HCT 28.5 (L) 11/11/2020 1545   PLT 243 10/27/2021 0956   PLT 430 11/11/2020 1545   MCV 88.1 10/27/2021 0956   MCV 87 11/11/2020 1545   MCH 30.6 10/27/2021 0956   MCHC 34.7 10/27/2021 0956   RDW 12.9 10/27/2021 0956   RDW 19.1 (H) 11/11/2020 1545   LYMPHSABS 1.7 10/27/2021 0956   LYMPHSABS 1.9 11/11/2020 1545   MONOABS 0.4 10/27/2021 0956   EOSABS 0.1 10/27/2021 0956   EOSABS 0.1 11/11/2020 1545   BASOSABS 0.0 10/27/2021 0956   BASOSABS 0.0 11/11/2020 1545    ASSESSMENT AND PLAN: 1. Type 2 diabetes mellitus with other specified complication, without long-term current use of insulin (HCC) A1c has improved but not at goal.  She is on metformin and glipizide.  She was not able to afford Jardiance.  We discussed adding Januvia.  She prefers to wait until she sees her endocrinologist next month. - POCT glycosylated hemoglobin (Hb A1C) - POCT glucose (manual entry) - glipiZIDE (GLUCOTROL) 10 MG tablet; Take 1.5 tablets (15 mg total) by mouth 2 (two) times daily before a meal.  Dispense: 270 tablet; Refill: 2 - Microalbumin / creatinine urine ratio  2. Diabetes mellitus treated with oral medication (HCC) See #1 above.  3. Hypertension associated with type 2 diabetes mellitus (HCC) Not at goal but she has not taken her medicines as yet for the morning.  She will do so when she leaves here.  4. Hyperlipidemia associated with type 2 diabetes mellitus (HCC) Continue simvastatin 20 mg daily.  5. Cervical radiculopathy Has some cervical radiculopathy symptoms but a lot of it seems as muscle tightness/spasms.  Will give a trial of Flexeril.  Advised medicine can cause drowsiness.  She will continue gabapentin as well and ibuprofen as needed.  If no improvement or any worsening, we can image. - cyclobenzaprine (FLEXERIL) 5 MG tablet; Take 1 tablet (5 mg total) by mouth 2  (two) times daily as needed for muscle spasms.  Dispense: 30 tablet; Refill: 0  6. Muscle spasm See #5 above. - cyclobenzaprine (FLEXERIL) 5 MG tablet; Take 1 tablet (5 mg total) by mouth 2 (two) times daily as needed for muscle spasms.  Dispense: 30 tablet; Refill: 0  7. Homeless  8. Screening for colon cancer FIT kit given today. - Fecal occult blood, imunochemical([facility])     Patient was given the opportunity to ask questions.  Patient verbalized understanding of the plan and was able to repeat key elements of the plan.   This documentation was completed using Paediatric nurse.  Any transcriptional errors are unintentional.  Orders Placed This Encounter  Procedures   POCT glycosylated hemoglobin (Hb A1C)   POCT glucose (manual entry)     Requested Prescriptions    No prescriptions requested or ordered in this encounter    No follow-ups on file.  Jonah Blue, MD, FACP

## 2023-01-11 NOTE — Patient Instructions (Addendum)
I have prescribed a muscle relaxant cyclobenzaprine for you to use as needed.  The medication can cause some drowsiness.  You can continue to use the ibuprofen as needed.  Take your gabapentin more consistently.  Your blood pressure is elevated.  Please remember to take your blood pressure medicines today.

## 2023-01-13 LAB — MICROALBUMIN / CREATININE URINE RATIO
Creatinine, Urine: 116.2 mg/dL
Microalb/Creat Ratio: 10 mg/g{creat} (ref 0–29)
Microalbumin, Urine: 11.2 ug/mL

## 2023-01-19 ENCOUNTER — Other Ambulatory Visit: Payer: Self-pay

## 2023-01-21 LAB — FECAL OCCULT BLOOD, IMMUNOCHEMICAL: Fecal Occult Bld: NEGATIVE

## 2023-01-25 ENCOUNTER — Ambulatory Visit: Payer: Self-pay | Admitting: Internal Medicine

## 2023-01-25 NOTE — Progress Notes (Deleted)
Name: Crystal Vasquez  MRN/ DOB: [id number], [birth year]   Age/ Sex: 52 y.o., female    PCP: Marcine Matar, MD   Reason for Endocrinology Evaluation: Type 2 Diabetes Mellitus     Date of Initial Endocrinology Visit: 02/20/2022    PATIENT IDENTIFIER: Crystal Vasquez is a 52 y.o. female with a past medical history of HTN, T2DM, history of thyroid disease. The patient presented for initial endocrinology clinic visit on 02/20/2022 for consultative assistance with her diabetes management.    HPI: Crystal Vasquez was    Diagnosed with DM 10-15 yrs ago  Prior Medications tried/Intolerance: Glipizide was started ~ 4 yrs ago                 Hemoglobin A1c has ranged from 6.7% in 2022, peaking at 9.0% in 2022.   She lives in her car which makes it difficult to eat healthy     THYROID HISTORY She had an FNA ~ 4 yrs ago of the thyroid while living in [place] Denies local neck swelling   Has Maternal grandmother and cousin and maternal aunt with  Hx of MEN   I had ordered thyroid ultrasound 02/2022 but by her visit in 08/2022 has not been done  SUBJECTIVE:   During the last visit (08/21/2022): A1c 8.3%     Today (01/25/23): Crystal Vasquez is here for follow-up on diabetes management.She  checks her blood sugars 0 times daily.   She admits to dietary indiscretions , she continues to live in her car She is having GI issues , craving fruits   She was started on Jardiance  through PCP 's office  Has loose stools with occasional diarrhea , that she attributes to eating certain foods such as dairy Has occasional palpitations  Denies local neck swelling   HOME DIABETES REGIMEN: Glipizide 10 mg, 1.5 tabs BID   Metformin 1000 mg twice daily Jardiance 10 mg daily     Statin: Yes ACE-I/ARB: Yes    METER DOWNLOAD SUMMARY: Did not bring   DIABETIC COMPLICATIONS: Microvascular complications:   Denies: CKD, neuropathy, retinopathy  Last eye exam: Completed  2022  Macrovascular complications:   Denies: CAD, PVD, CVA   PAST HISTORY: Past Medical History:  Past Medical History:  Diagnosis Date   Anemia    Asthma    due to acid reflux   Depression    DM2    Dyspnea    GERD (gastroesophageal reflux disease)    Headache    Heart murmur    benign   Hypertension    Obesity (BMI 30-39.9)    Thyroid disease    Past Surgical History:  Past Surgical History:  Procedure Laterality Date   ABDOMINAL HYSTERECTOMY     BIOPSY THYROID     IR ANGIOGRAM PELVIS SELECTIVE OR SUPRASELECTIVE  05/09/2021   IR ANGIOGRAM SELECTIVE EACH ADDITIONAL VESSEL  05/09/2021   IR AORTAGRAM ABDOMINAL SERIALOGRAM  05/09/2021   IR EMBO TUMOR ORGAN ISCHEMIA INFARCT INC GUIDE ROADMAPPING  05/09/2021   IR RADIOLOGIST EVAL & MGMT  04/11/2021   IR US GUIDE VASC ACCESS LEFT  05/09/2021   ROBOTIC ASSISTED LAPAROSCOPIC HYSTERECTOMY AND SALPINGECTOMY Bilateral 05/18/2021   Procedure: XI ROBOTIC ASSISTED LAPAROSCOPIC HYSTERECTOMY AND SALPINGECTOMY, UTERUS MORE THAN 250 GRAMS;  Surgeon: Carver Fila, MD;  Location: [facility];  Service: Gynecology;  Laterality: Bilateral;    Social History:  reports that she has never smoked. She has never been exposed to tobacco smoke. She has  never used smokeless tobacco. She reports that she does not currently use alcohol. She reports that she does not use drugs. Family History:  Family History  Problem Relation Age of Onset   Diabetes Mother    Hypertension Mother    Diabetes Father    Hypertension Father    Endometrial cancer Maternal Aunt    Colon cancer Neg Hx    Breast cancer Neg Hx    Ovarian cancer Neg Hx    Pancreatic cancer Neg Hx    Prostate cancer Neg Hx      HOME MEDICATIONS: Allergies as of 01/25/2023   No Known Allergies      Medication List        Accurate as of January 25, 2023  7:19 AM. If you have any questions, ask your nurse or doctor.          Acetaminophen Extra Strength 500 MG  Caps SMARTSIG:2 Capsule(s) By Mouth Daily PRN   amLODipine 10 MG tablet Commonly known as: NORVASC Take 1 tablet (10 mg total) by mouth daily.   bisoprolol 10 MG tablet Commonly known as: ZEBETA Take 1 tablet (10 mg total) by mouth daily.   cyclobenzaprine 5 MG tablet Commonly known as: FLEXERIL Take 1 tablet (5 mg total) by mouth 2 (two) times daily as needed for muscle spasms.   ferrous sulfate 325 (65 FE) MG tablet Take 325 mg by mouth in the morning.   fluticasone 27.5 MCG/SPRAY nasal spray Commonly known as: VERAMYST Place 1 spray into the nose in the morning.   fluticasone 50 MCG/ACT nasal spray Commonly known as: FLONASE Place 2 sprays into both nostrils daily.   gabapentin 300 MG capsule Commonly known as: NEURONTIN Take 1 capsule (300 mg total) by mouth at bedtime.   glipiZIDE 10 MG tablet Commonly known as: GLUCOTROL Take 1.5 tablets (15 mg total) by mouth 2 (two) times daily before a meal.   hydrALAZINE 50 MG tablet Commonly known as: APRESOLINE Take 1 tablet (50 mg total) by mouth 3 (three) times daily.   hydrochlorothiazide 25 MG tablet Commonly known as: HYDRODIURIL Take 1 tablet (25 mg total) by mouth daily.   Jardiance 10 MG Tabs tablet Generic drug: empagliflozin Take 1 tablet (10 mg total) by mouth daily before breakfast.   meloxicam 15 MG tablet Commonly known as: MOBIC Take 1 tablet (15 mg total) by mouth daily as needed.   metFORMIN 1000 MG tablet Commonly known as: GLUCOPHAGE Take 1 tablet (1,000 mg total) by mouth 2 (two) times daily with a meal.   olmesartan 40 MG tablet Commonly known as: BENICAR Take 1 tablet (40 mg total) by mouth daily.   ondansetron 4 MG tablet Commonly known as: Zofran Take 1 tablet (4 mg total) by mouth every 8 (eight) hours as needed for nausea or vomiting.   QUEtiapine 50 MG tablet Commonly known as: SEROquel Take 1 tablet (50 mg total) by mouth at bedtime.   sertraline 100 MG tablet Commonly known  as: Zoloft Take 1.5 tablets (150 mg total) by mouth daily.   simvastatin 20 MG tablet Commonly known as: ZOCOR Take 1 tablet (20 mg total) by mouth at bedtime.         ALLERGIES: No Known Allergies   REVIEW OF SYSTEMS: A comprehensive ROS was conducted with the patient and is negative except as per HPI    OBJECTIVE:   VITAL SIGNS: LMP 04/27/2021 (Exact Date)    PHYSICAL EXAM:  General: Pt appears well and is  in NAD  Neck: General: Supple without adenopathy or carotid bruits. Thyroid: Prominent thyroid gland  Lungs: Clear with good BS bilat   Heart: RRR   Abdomen:  soft, nontender  Extremities:  Lower extremities - No pretibial edema.   Neuro: MS is good with appropriate affect, pt is alert and Ox3    DM foot exam: 02/20/2022  The skin of the feet is intact without sores or ulcerations. The pedal pulses are 2+ on right and 2+ on left. The sensation is intact to a screening 5.07, 10 gram monofilament bilaterally   DATA REVIEWED:  Lab Results  Component Value Date   HGBA1C 7.7 (A) 01/11/2023   HGBA1C 8.3 (A) 08/04/2022   HGBA1C 7.8 (A) 01/24/2022    Latest Reference Range & Units 08/21/22 11:00  TSH 0.35 - 5.50 uIU/mL 0.28 (L)  T4,Free(Direct) 0.60 - 1.60 ng/dL 9.51  (L): Data is abnormally low  ASSESSMENT / PLAN / RECOMMENDATIONS:   1) Type 2 Diabetes Mellitus, poorly controlled, Without complications - Most recent A1c of 8.3 %. Goal A1c < 7.0 %.    -Patient was social determinants, currently living in her car, this makes it very difficult to get access to healthy food, or glucose checks -She was started on Jardiance through her PCPs office, unclear if she is on patient assistance or not but so far has been able to get through the pharmacy downstairs -I will increase glipizide as below -No changes to metformin  MEDICATIONS: Increase glipizide 10 mg, 1.5 tablet before breakfast and 1.5 tablet before supper Continue metformin 1000 mg twice daily Continue  Jardiance 10 mg daily  EDUCATION / INSTRUCTIONS: BG monitoring instructions: Patient is instructed to check her blood sugars 2-3 times a week. Call [facility] if: BG persistently < 70  I reviewed the Rule of 15 for the treatment of hypoglycemia in detail with the patient. Literature supplied.   2) Diabetic complications:  Eye: Does not have known diabetic retinopathy.  Neuro/ Feet: Does not have known diabetic peripheral neuropathy. Renal: Patient does not have known baseline CKD. She is  on an ACEI/ARB at present.  3) Multinodular goiter:  -Patient is s/p benign FNA of a thyroid nodule approximately 4 years ago while living in [place] -No local neck symptoms -Of note the patient has a 2 nd and 3rd  degree relatives with multiple endocrine neoplasia -I had ordered thyroid ultrasound a couple of times but this has not been scheduled    4) Subclinical Hyperthyroidism:  -Possibly due to autonomous thyroid nodule -TSH continues to improve without treatment, will continue to monitor    Follow-up in 6 months  Signed electronically by: Lyndle Herrlich, MD  [facility]  [facility] [address] [place] [place], [place] [address] Phone: [phone] FAX: [phone]   CC: Marcine Matar, MD [address] [place] [place] [address] Phone: [phone]  Fax: [phone]    Return to Endocrinology clinic as below: Future Appointments  Date Time Provider Department Center  01/25/2023  9:50 AM Crystal Vasquez, Crystal Dolores, MD [facility] None  03/09/2023  8:00 AM Lauro Franklin, MD [facility] None  05/17/2023  9:50 AM Marcine Matar, MD [facility] None

## 2023-01-26 ENCOUNTER — Other Ambulatory Visit: Payer: Self-pay

## 2023-02-25 IMAGING — CR DG CHEST 2V
2 series · 2 of 2 positions shown · non-contrast
Comparison: None.

CLINICAL DATA: Fever status post hysterectomy.

EXAM:
CHEST - 2 VIEW

[w chest pa]
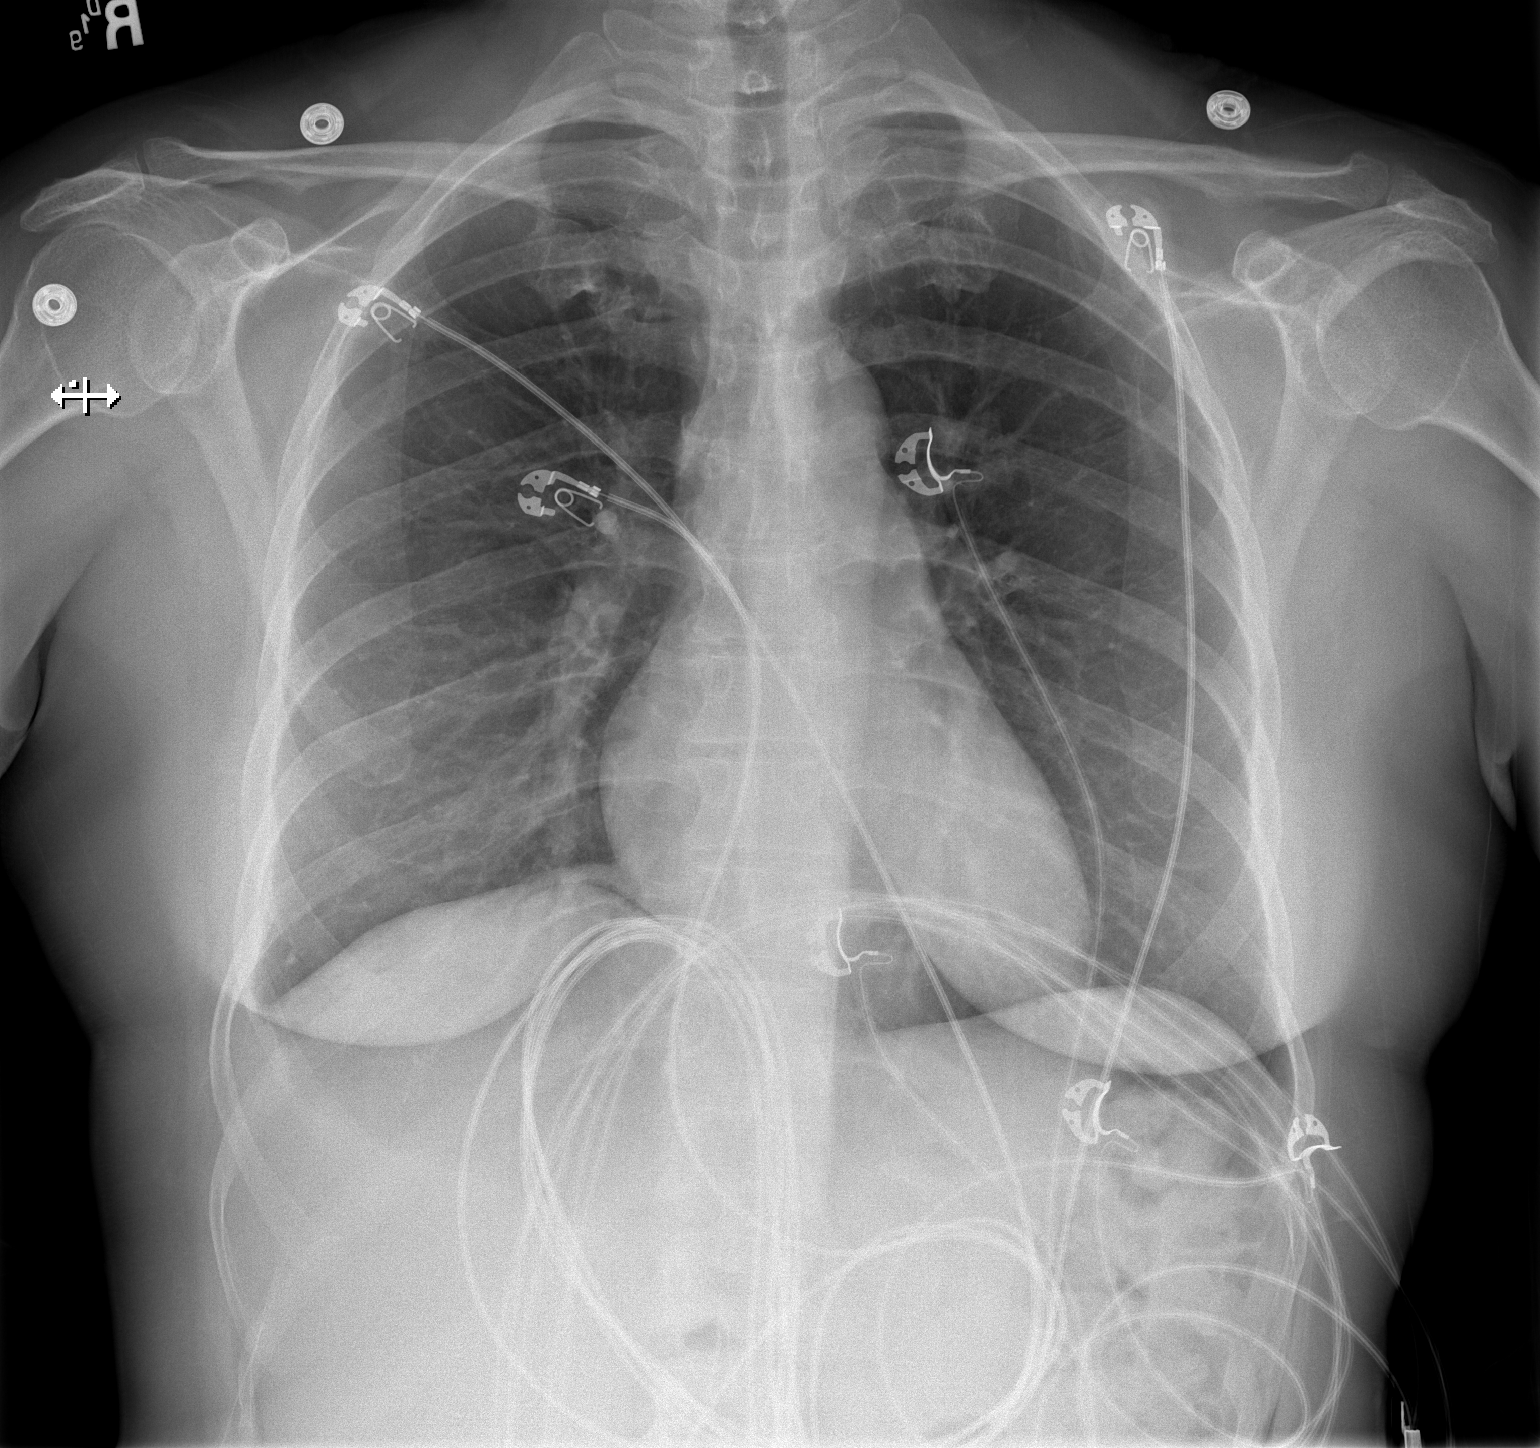

[w chest lat]
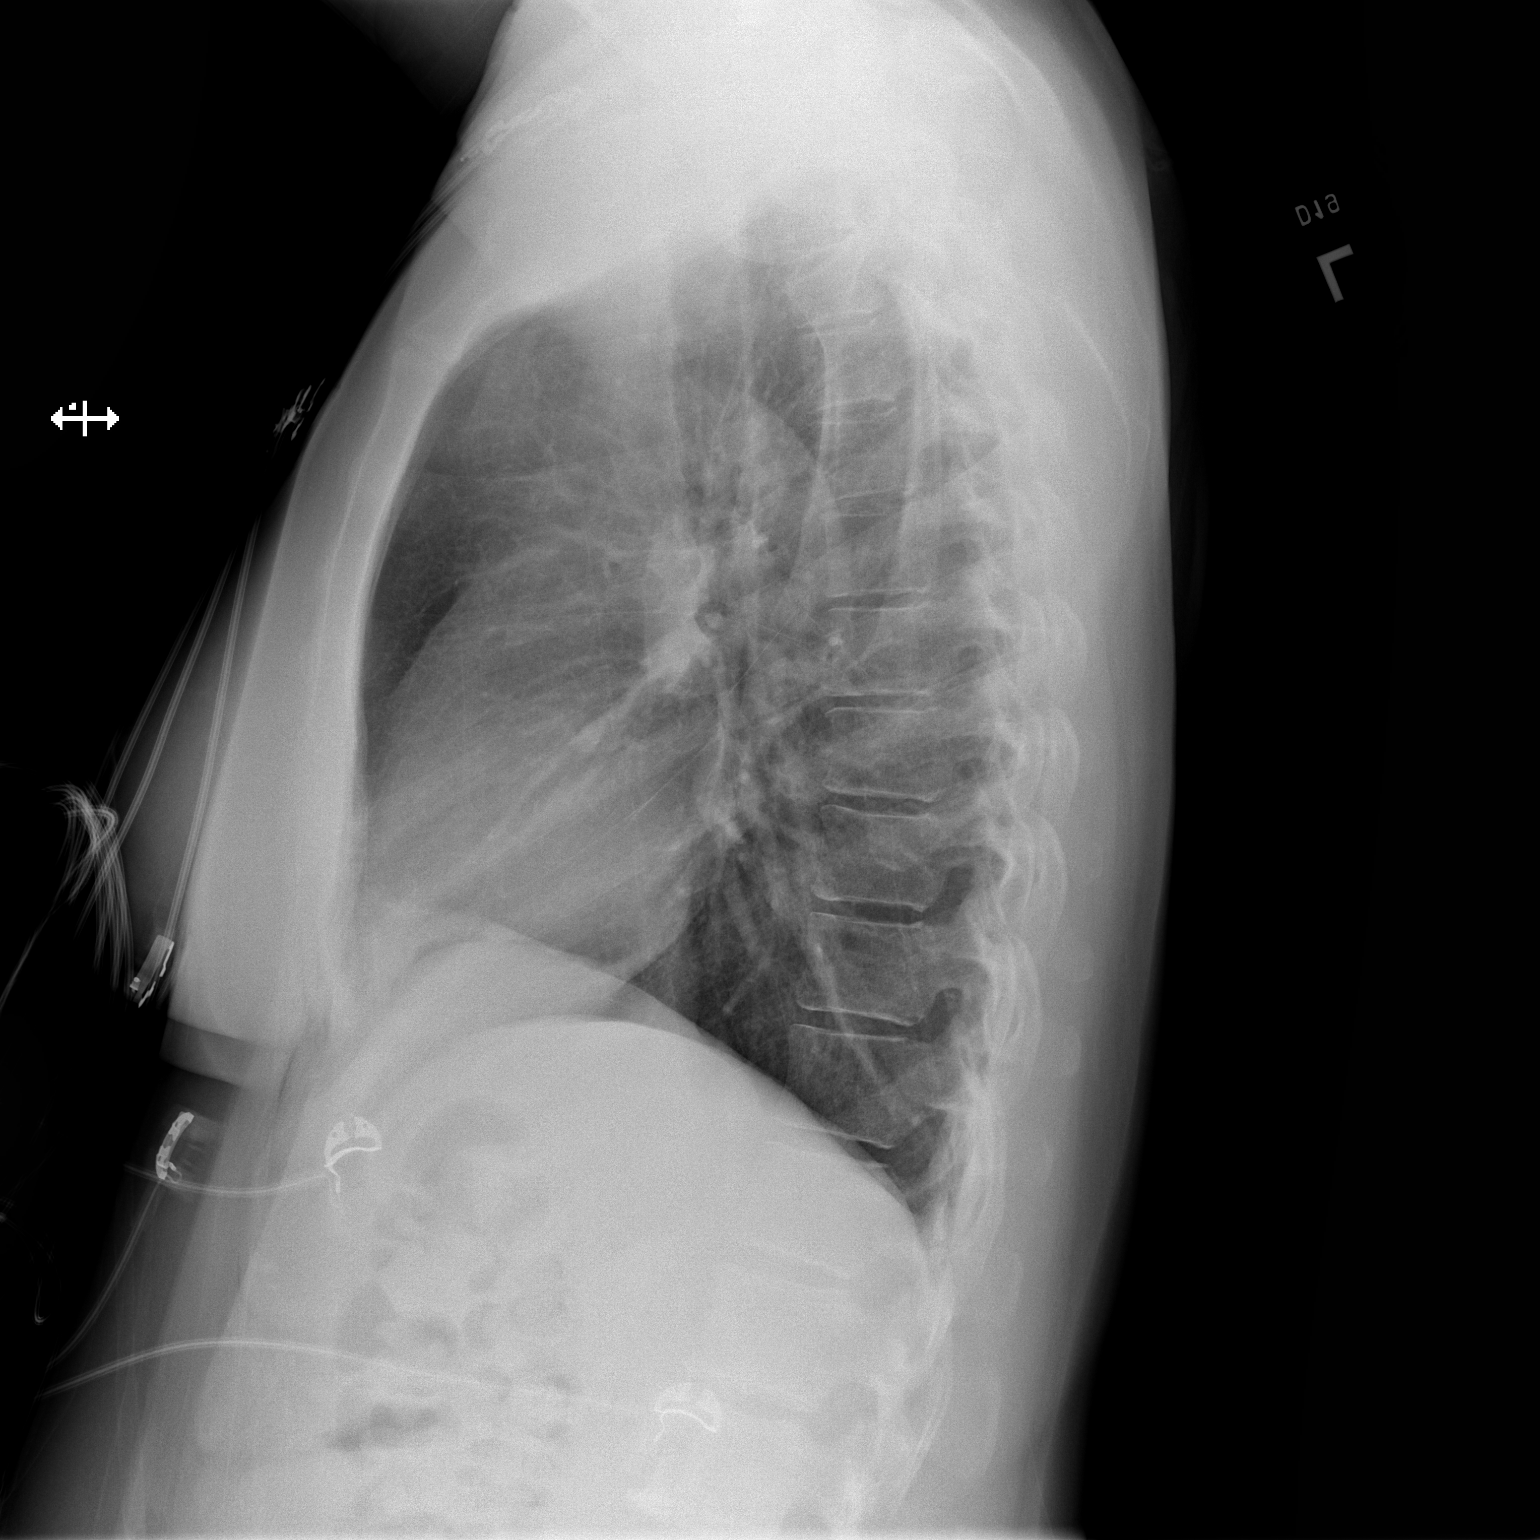

[2 of 2 positions shown; findings below may reference images not displayed]

FINDINGS: The heart size and mediastinal contours are within normal limits.
Both lungs are clear. The visualized skeletal structures are
unremarkable.
IMPRESSION: No active cardiopulmonary disease.

## 2023-03-03 ENCOUNTER — Other Ambulatory Visit: Payer: Self-pay | Admitting: Internal Medicine

## 2023-03-03 DIAGNOSIS — I152 Hypertension secondary to endocrine disorders: Secondary | ICD-10-CM

## 2023-03-05 ENCOUNTER — Encounter: Payer: Self-pay | Admitting: Hematology and Oncology

## 2023-03-05 ENCOUNTER — Other Ambulatory Visit: Payer: Self-pay

## 2023-03-05 MED ORDER — BISOPROLOL FUMARATE 10 MG PO TABS
10.0000 mg | ORAL_TABLET | Freq: Every day | ORAL | 0 refills | Status: DC
  Filled 2023-03-05: qty 90, 90d supply, fill #0

## 2023-03-08 ENCOUNTER — Encounter: Payer: Self-pay | Admitting: Hematology and Oncology

## 2023-03-08 ENCOUNTER — Other Ambulatory Visit: Payer: Self-pay

## 2023-03-08 ENCOUNTER — Telehealth: Payer: Self-pay | Admitting: Physician Assistant

## 2023-03-08 DIAGNOSIS — J019 Acute sinusitis, unspecified: Secondary | ICD-10-CM

## 2023-03-08 DIAGNOSIS — B9689 Other specified bacterial agents as the cause of diseases classified elsewhere: Secondary | ICD-10-CM

## 2023-03-08 MED ORDER — AMOXICILLIN-POT CLAVULANATE 875-125 MG PO TABS
1.0000 | ORAL_TABLET | Freq: Two times a day (BID) | ORAL | 0 refills | Status: DC
  Filled 2023-03-08: qty 14, 7d supply, fill #0

## 2023-03-08 NOTE — Progress Notes (Signed)

## 2023-03-08 NOTE — Progress Notes (Signed)
I have spent 5 minutes in review of e-visit questionnaire, review and updating patient chart, medical decision making and response to patient.   Mia Milan Cody Jacklynn Dehaas, PA-C    

## 2023-03-09 ENCOUNTER — Ambulatory Visit (INDEPENDENT_AMBULATORY_CARE_PROVIDER_SITE_OTHER): Payer: No Payment, Other | Admitting: Student in an Organized Health Care Education/Training Program

## 2023-03-09 ENCOUNTER — Other Ambulatory Visit: Payer: Self-pay

## 2023-03-09 ENCOUNTER — Encounter (HOSPITAL_COMMUNITY): Payer: Self-pay | Admitting: Student in an Organized Health Care Education/Training Program

## 2023-03-09 DIAGNOSIS — F331 Major depressive disorder, recurrent, moderate: Secondary | ICD-10-CM | POA: Diagnosis not present

## 2023-03-09 DIAGNOSIS — F411 Generalized anxiety disorder: Secondary | ICD-10-CM | POA: Diagnosis not present

## 2023-03-09 DIAGNOSIS — F4310 Post-traumatic stress disorder, unspecified: Secondary | ICD-10-CM

## 2023-03-09 MED ORDER — QUETIAPINE FUMARATE 50 MG PO TABS
50.0000 mg | ORAL_TABLET | Freq: Every day | ORAL | 2 refills | Status: DC
  Filled 2023-03-09 – 2023-04-05 (×2): qty 30, 30d supply, fill #0

## 2023-03-09 MED ORDER — SERTRALINE HCL 100 MG PO TABS
200.0000 mg | ORAL_TABLET | Freq: Every day | ORAL | 2 refills | Status: DC
  Filled 2023-03-09 – 2023-04-05 (×2): qty 60, 30d supply, fill #0

## 2023-03-09 NOTE — Progress Notes (Signed)
[facility] MD/PA/NP OP Progress Note  03/09/2023 8:41 AM Crystal Vasquez  MRN:  [id number]  Chief Complaint:  Chief Complaint  Patient presents with   Depression   Anxiety   HPI:  Crystal Vasquez is a 52 yr old female who presents for Follow Up and Medication Management.  PPHx is significant for Depression, Anxiety, PTSD and possible ADHD, no history of Suicide Attempts, Self Injurious Behavior, or Psychiatric Hospitalizations.    She reports she has been doing okay but has had more stressors recently.  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  She reports that a few people have quit which adds more work to everyone else.  She reports she has also had sinus infection which has caused her to have congestion and headaches.  She reports that on top of this the additional stress from the holiday season has made both of these things worse.  She reports that despite the stress she does remain in an okay spot.  She reports that she did get a raise along with becoming full-time and that she will soon be getting benefits.  She reports she has adopted a cat which has been very helpful but does report she will have to decide on rehome and the cat since she is still living in her car.  Discussed increasing her Zoloft to help address her increased depression and anxiety from the stress and she was agreeable with this.  Encouraged her to continue her positive thinking and consider journaling.  She reports no SI, HI, or AVH.  She reports no other concerns at present.  She will return for follow-up in approximately 6 to 8 weeks.   Visit Diagnosis:    ICD-10-CM   1. MDD (major depressive disorder), recurrent episode, moderate (HCC)  F33.1 QUEtiapine (SEROQUEL) 50 MG tablet    sertraline (ZOLOFT) 100 MG tablet    2. Generalized anxiety disorder  F41.1 QUEtiapine (SEROQUEL) 50 MG tablet    sertraline (ZOLOFT) 100 MG tablet    3. PTSD (post-traumatic stress disorder)  F43.10  QUEtiapine (SEROQUEL) 50 MG tablet    sertraline (ZOLOFT) 100 MG tablet        Past Psychiatric History: Depression, Anxiety, PTSD and possible ADHD, no history of Suicide Attempts, Self Injurious Behavior, or Psychiatric Hospitalizations.    Past Medical History:  Past Medical History:  Diagnosis Date   Anemia    Asthma    due to acid reflux   Depression    DM2    Dyspnea    GERD (gastroesophageal reflux disease)    Headache    Heart murmur    benign   Hypertension    Obesity (BMI 30-39.9)    Thyroid disease     Past Surgical History:  Procedure Laterality Date   ABDOMINAL HYSTERECTOMY     BIOPSY THYROID     IR ANGIOGRAM PELVIS SELECTIVE OR SUPRASELECTIVE  05/09/2021   IR ANGIOGRAM SELECTIVE EACH ADDITIONAL VESSEL  05/09/2021   IR AORTAGRAM ABDOMINAL SERIALOGRAM  05/09/2021   IR EMBO TUMOR ORGAN ISCHEMIA INFARCT INC GUIDE ROADMAPPING  05/09/2021   IR RADIOLOGIST EVAL & MGMT  04/11/2021   IR US GUIDE VASC ACCESS LEFT  05/09/2021   ROBOTIC ASSISTED LAPAROSCOPIC HYSTERECTOMY AND SALPINGECTOMY Bilateral 05/18/2021   Procedure: XI ROBOTIC ASSISTED LAPAROSCOPIC HYSTERECTOMY AND SALPINGECTOMY, UTERUS MORE THAN 250 GRAMS;  Surgeon: Carver Fila, MD;  Location: [facility];  Service: Gynecology;  Laterality: Bilateral;    Family  Psychiatric History: Mother- Bipolar Disorder, Suicide 12 (OD) Sister- Schizophrenia, Suicide 2010 (OD) Brother- Bipolar Disorder, EtOH Abuse, Polysubstance Abuse  Family History:  Family History  Problem Relation Age of Onset   Diabetes Mother    Hypertension Mother    Diabetes Father    Hypertension Father    Endometrial cancer Maternal Aunt    Colon cancer Neg Hx    Breast cancer Neg Hx    Ovarian cancer Neg Hx    Pancreatic cancer Neg Hx    Prostate cancer Neg Hx     Social History:  Social History   Socioeconomic History   Marital status: Single    Spouse name: Not on file   Number of children: Not on file   Years of  education: Not on file   Highest education level: Associate degree: occupational, Scientist, product/process development, or vocational program  Occupational History   Not on file  Tobacco Use   Smoking status: Never    Passive exposure: Never   Smokeless tobacco: Never  Vaping Use   Vaping status: Never Used  Substance and Sexual Activity   Alcohol use: Not Currently   Drug use: Never   Sexual activity: Not Currently  Other Topics Concern   Not on file  Social History Narrative   Not on file   Social Drivers of Health   Financial Resource Strain: Medium Risk (01/11/2023)   Overall Financial Resource Strain (CARDIA)    Difficulty of Paying Living Expenses: Somewhat hard  Food Insecurity: Food Insecurity Present (01/11/2023)   Hunger Vital Sign    Worried About Running Out of Food in the Last Year: Sometimes true    Ran Out of Food in the Last Year: Sometimes true  Transportation Needs: No Transportation Needs (01/11/2023)   PRAPARE - Administrator, Civil Service (Medical): No    Lack of Transportation (Non-Medical): No  Physical Activity: Unknown (01/11/2023)   Exercise Vital Sign    Days of Exercise per Week: 0 days    Minutes of Exercise per Session: Not on file  Stress: Stress Concern Present (01/11/2023)   [employer] of Occupational Health - Occupational Stress Questionnaire    Feeling of Stress : Rather much  Social Connections: Unknown (01/11/2023)   Social Connection and Isolation Panel [NHANES]    Frequency of Communication with Friends and Family: Patient declined    Frequency of Social Gatherings with Friends and Family: Patient declined    Attends Religious Services: Patient declined    Database administrator or Organizations: Patient declined    Attends Engineer, structural: Not on file    Marital Status: Patient declined    Allergies: No Known Allergies  Metabolic Disorder Labs: Lab Results  Component Value Date   HGBA1C 7.7 (A) 01/11/2023   No  results found for: "PROLACTIN" No results found for: "CHOL", "TRIG", "HDL", "CHOLHDL", "VLDL", "LDLCALC" Lab Results  Component Value Date   TSH 0.28 (L) 08/21/2022   TSH 0.21 (L) 02/20/2022    Therapeutic Level Labs: No results found for: "LITHIUM" No results found for: "VALPROATE" No results found for: "CBMZ"  Current Medications: Current Outpatient Medications  Medication Sig Dispense Refill   ACETAMINOPHEN EXTRA STRENGTH 500 MG capsule SMARTSIG:2 Capsule(s) By Mouth Daily PRN     amLODipine (NORVASC) 10 MG tablet Take 1 tablet (10 mg total) by mouth daily. 90 tablet 1   amoxicillin-clavulanate (AUGMENTIN) 875-125 MG tablet Take 1 tablet by mouth 2 (two) times daily. 14 tablet 0  bisoprolol (ZEBETA) 10 MG tablet Take 1 tablet (10 mg total) by mouth daily. 90 tablet 0   cyclobenzaprine (FLEXERIL) 5 MG tablet Take 1 tablet (5 mg total) by mouth 2 (two) times daily as needed for muscle spasms. 30 tablet 0   empagliflozin (JARDIANCE) 10 MG TABS tablet Take 1 tablet (10 mg total) by mouth daily before breakfast. (Patient not taking: Reported on 01/11/2023) 30 tablet 4   ferrous sulfate 325 (65 FE) MG tablet Take 325 mg by mouth in the morning.     fluticasone (FLONASE) 50 MCG/ACT nasal spray Place 2 sprays into both nostrils daily. 16 g 0   fluticasone (VERAMYST) 27.5 MCG/SPRAY nasal spray Place 1 spray into the nose in the morning.     gabapentin (NEURONTIN) 300 MG capsule Take 1 capsule (300 mg total) by mouth at bedtime. 90 capsule 1   glipiZIDE (GLUCOTROL) 10 MG tablet Take 1.5 tablets (15 mg total) by mouth 2 (two) times daily before a meal. 270 tablet 2   hydrALAZINE (APRESOLINE) 50 MG tablet Take 1 tablet (50 mg total) by mouth 3 (three) times daily. 90 tablet 4   hydrochlorothiazide (HYDRODIURIL) 25 MG tablet Take 1 tablet (25 mg total) by mouth daily. 90 tablet 1   meloxicam (MOBIC) 15 MG tablet Take 1 tablet (15 mg total) by mouth daily as needed. 30 tablet 2   metFORMIN  (GLUCOPHAGE) 1000 MG tablet Take 1 tablet (1,000 mg total) by mouth 2 (two) times daily with a meal. 180 tablet 2   olmesartan (BENICAR) 40 MG tablet Take 1 tablet (40 mg total) by mouth daily. 90 tablet 3   ondansetron (ZOFRAN) 4 MG tablet Take 1 tablet (4 mg total) by mouth every 8 (eight) hours as needed for nausea or vomiting. 21 tablet 0   QUEtiapine (SEROQUEL) 50 MG tablet Take 1 tablet (50 mg total) by mouth at bedtime. 30 tablet 2   sertraline (ZOLOFT) 100 MG tablet Take 2 tablets (200 mg total) by mouth daily. 60 tablet 2   simvastatin (ZOCOR) 20 MG tablet Take 1 tablet (20 mg total) by mouth at bedtime. 30 tablet 6   No current facility-administered medications for this visit.     Musculoskeletal: Strength & Muscle Tone: within normal limits Gait & Station: normal Patient leans: N/A  Psychiatric Specialty Exam: Review of Systems  HENT:  Positive for sinus pressure and sinus pain.   Respiratory:  Negative for shortness of breath.   Cardiovascular:  Negative for chest pain.  Gastrointestinal:  Negative for abdominal pain, constipation, diarrhea, nausea and vomiting.  Neurological:  Positive for headaches. Negative for dizziness and weakness.  Psychiatric/Behavioral:  Positive for dysphoric mood. Negative for hallucinations, sleep disturbance and suicidal ideas. The patient is not nervous/anxious.     Blood pressure (!) 150/93, height 5\' 6"  (1.676 m), weight 173 lb 3.2 oz (78.6 kg), last menstrual period 04/27/2021, SpO2 100%.Body mass index is 27.96 kg/m.  General Appearance: Casual and Fairly Groomed  Eye Contact:  Good  Speech:  Clear and Coherent and Normal Rate  Volume:  Normal  Mood:   "stressed but ok"  Affect:  Appropriate and Congruent  Thought Process:  Coherent and Goal Directed  Orientation:  Full (Time, Place, and Person)  Thought Content: WDL and Logical   Suicidal Thoughts:  No  Homicidal Thoughts:  No  Memory:  Immediate;   Good Recent;   Good   Judgement:  Good  Insight:  Good  Psychomotor Activity:  Normal  Concentration:  Concentration: Good and Attention Span: Good  Recall:  Good  Fund of Knowledge: Good  Language: Good  Akathisia:  Negative  Handed:  Right  AIMS (if indicated): AIMS=0  Assets:  Communication Skills Desire for Improvement Resilience Vocational/Educational  ADL's:  Intact  Cognition: WNL  Sleep:  Good   Screenings: AIMS    Flowsheet Row Clinical Support from 03/09/2023 in [facility] Clinical Support from 07/07/2022 in [facility]  AIMS Total Score 0 0      GAD-7    Flowsheet Row Office Visit from 08/04/2022 in [facility] [place] Counselor from 07/06/2021 in [facility] Office Visit [facility]  Total GAD-7 Score 0 14 15      PHQ2-9    Flowsheet Row Office Visit from 08/04/2022 in [facility] [place] [facility] [place]. [facility] Office Visit from 06/29/2021 in [facility] Office Visit from 02/17/2021 in [facility] Office Visit from 11/11/2020 in [facility]  PHQ-2 Total Score 1 2 2 2   PHQ-9 Total Score 1 4 11 10       Flowsheet Row Counselor from 07/06/2021 in [facility] ED to Hosp-Admission (Discharged) from 05/19/2021 in [facility] [place] [facility] Admission (Discharged) from 05/18/2021 in [place] LONG PERIOPERATIVE AREA  C-SSRS RISK CATEGORY No Risk No Risk No Risk        Assessment and Plan:  Kielyn Dudziak is a 52 yr old female who presents for Follow Up and Medication Management.  PPHx is significant for Depression, Anxiety, PTSD and possible ADHD, no history of Suicide Attempts, Self Injurious Behavior, or  Psychiatric Hospitalizations.     Denylah has had increased stressors due to a Writer at work and having a sinus infection as well as it being the holiday rush.  To address her increased depression and anxiety we will increase her Zoloft at this time.  We will not make any other changes to her medications.  Refills were sent in.  She will return for follow-up in approximately 6 to 8 weeks.   MDD, Recurrent, Moderate  GAD  PTSD: -Increase Zoloft to 200 mg daily for depression and anxiety.  45 (100 mg) tablets with 2 refills. -Continue Seroquel 50 mg QHS for augmentation, mood stability, and insomnia.  30 tablets with 2 refills.    Collaboration of Care:   Patient/Guardian was advised Release of Information must be obtained prior to any record release in order to collaborate their care with an outside provider. Patient/Guardian was advised if they have not already done so to contact the registration department to sign all necessary forms in order for [place] to release information regarding their care.   Consent: Patient/Guardian gives verbal consent for treatment and assignment of benefits for services provided during this visit. Patient/Guardian expressed understanding and agreed to proceed.    Lauro Franklin, MD 03/09/2023, 8:41 AM

## 2023-03-16 ENCOUNTER — Ambulatory Visit (HOSPITAL_COMMUNITY)
Admission: EM | Admit: 2023-03-16 | Discharge: 2023-03-16 | Disposition: A | Discharge status: Home / Self Care | Payer: Self-pay | Attending: Family Medicine | Admitting: Family Medicine

## 2023-03-16 ENCOUNTER — Encounter: Payer: Self-pay | Admitting: Hematology and Oncology

## 2023-03-16 ENCOUNTER — Encounter (HOSPITAL_COMMUNITY): Payer: Self-pay | Admitting: Emergency Medicine

## 2023-03-16 ENCOUNTER — Other Ambulatory Visit: Payer: Self-pay

## 2023-03-16 ENCOUNTER — Telehealth: Payer: Self-pay | Admitting: Emergency Medicine

## 2023-03-16 DIAGNOSIS — J019 Acute sinusitis, unspecified: Secondary | ICD-10-CM

## 2023-03-16 DIAGNOSIS — J329 Chronic sinusitis, unspecified: Secondary | ICD-10-CM

## 2023-03-16 MED ORDER — DOXYCYCLINE HYCLATE 100 MG PO TABS
100.0000 mg | ORAL_TABLET | Freq: Two times a day (BID) | ORAL | 0 refills | Status: AC
  Filled 2023-03-16: qty 14, 7d supply, fill #0

## 2023-03-16 MED ORDER — FLUCONAZOLE 150 MG PO TABS
150.0000 mg | ORAL_TABLET | ORAL | 0 refills | Status: AC
Start: 2023-03-16 — End: 2023-03-25
  Filled 2023-03-16: qty 3, 9d supply, fill #0

## 2023-03-16 MED ORDER — FLUTICASONE PROPIONATE 50 MCG/ACT NA SUSP
2.0000 | Freq: Every day | NASAL | 0 refills | Status: AC
  Filled 2023-03-16 (×2): qty 16, 30d supply, fill #0

## 2023-03-16 NOTE — Progress Notes (Signed)
Because you're still having symptoms despite appropriate treatment, I feel your condition warrants further evaluation and I recommend that you be seen in a face to face visit.   NOTE: There will be NO CHARGE for this eVisit   If you are having a true medical emergency please call 911.      For an urgent face to face visit, [place] has eight urgent care centers for your convenience:   NEW!! [facility] at [facility] Get Driving Directions [phone] [address], [address] [place], [address]    [facility] Health [facility] Care Center at [facility] Get Driving Directions [phone] [address] [place], [place] [address]   [facility] Health Urgent Care Center [facility]) Get Driving Directions [phone] [address] [place], [place] [address]  [facility] Health Urgent Care Center [facility] [place]) Get Driving Directions [phone] [address] [place],  [place]  [address]  [facility] - at [employer]  [phone] [id number] [place],  [place] [address]   [facility] Health Urgent Care at [facility] Get Driving Directions [phone] [address], [address] [place], [place] [address]   [facility]gent Care at [facility] Get Driving Directions  [phone] [address] [place], [place] [address]   [facility] Health Urgent Care at [facility] Directions [phone] [address], [address] [place], [place] [address]  Your MyChart E-visit questionnaire answers were reviewed by a board certified advanced clinical practitioner to complete your personal care plan based on your specific symptoms.  Thank you for using e-Visits.   Approximately 5 minutes was used in reviewing the patient's chart, questionnaire, prescribing medications, and documentation.

## 2023-03-16 NOTE — ED Provider Notes (Signed)
[facility]    CSN: [id number] Arrival date & time: 03/16/23  1047      History   Chief Complaint No chief complaint on file.   HPI Crystal Vasquez is a 52 y.o. female.   HPI Here for continued sinus congestion and drainage and cough.  She had had nasal drainage and sinus pressure for about 3 weeks prior to being seen in an e-visit on December 19.  She was prescribed Augmentin.  It did help her some but she remained with some congestion when she was finishing the 7-day supply and then the congestion worsened again.  No fever in this last 2 to 3 days.  She is having more sinus pressure and some cough and postnasal drainage.   She does have diabetes and does not want to tell me how her sugars are doing lately  NKDA  She has developed some vaginal discharge and itching, most consistent with yeast infection Past Medical History:  Diagnosis Date   Anemia    Asthma    due to acid reflux   Depression    DM2    Dyspnea    GERD (gastroesophageal reflux disease)    Headache    Heart murmur    benign   Hypertension    Obesity (BMI 30-39.9)    Thyroid disease     Patient Active Problem List   Diagnosis Date Noted   Subclinical hyperthyroidism 02/20/2022   Hyperlipidemia associated with type 2 diabetes mellitus (HCC) 01/24/2022   Excessive daytime sleepiness 10/31/2021   Snoring 10/31/2021   Fever postop 05/19/2021   Uterine polyp 05/09/2021   Hypertension associated with type 2 diabetes mellitus (HCC) 12/03/2020   Uncontrolled type 2 diabetes mellitus with hyperglycemia (HCC) 12/03/2020   Uterine mass 12/03/2020   Abnormal uterine bleeding (AUB) 11/11/2020   History of uterine fibroid 11/11/2020   Iron deficiency anemia due to chronic blood loss 11/11/2020   History of menorrhagia 11/11/2020    Past Surgical History:  Procedure Laterality Date   ABDOMINAL HYSTERECTOMY     BIOPSY THYROID     IR ANGIOGRAM PELVIS SELECTIVE OR SUPRASELECTIVE   05/09/2021   IR ANGIOGRAM SELECTIVE EACH ADDITIONAL VESSEL  05/09/2021   IR AORTAGRAM ABDOMINAL SERIALOGRAM  05/09/2021   IR EMBO TUMOR ORGAN ISCHEMIA INFARCT INC GUIDE ROADMAPPING  05/09/2021   IR RADIOLOGIST EVAL & MGMT  04/11/2021   IR US GUIDE VASC ACCESS LEFT  05/09/2021   ROBOTIC ASSISTED LAPAROSCOPIC HYSTERECTOMY AND SALPINGECTOMY Bilateral 05/18/2021   Procedure: XI ROBOTIC ASSISTED LAPAROSCOPIC HYSTERECTOMY AND SALPINGECTOMY, UTERUS MORE THAN 250 GRAMS;  Surgeon: Carver Fila, MD;  Location: [facility];  Service: Gynecology;  Laterality: Bilateral;    OB History     Gravida  3   Para  0   Term  0   Preterm  0   AB  3   Living  0      SAB  1   IAB  2   Ectopic  0   Multiple  0   Live Births  0            Home Medications    Prior to Admission medications   Medication Sig Start Date End Date Taking? Authorizing Provider  doxycycline (VIBRAMYCIN) 100 MG capsule Take 1 capsule (100 mg total) by mouth 2 (two) times daily for 7 days. 03/16/23 03/23/23 Yes Zenia Resides, MD  fluconazole (DIFLUCAN) 150 MG tablet Take 1 tablet (150 mg total) by mouth every  3 (three) days for 3 doses. 03/16/23 03/23/23 Yes Zenia Resides, MD  fluticasone (FLONASE) 50 MCG/ACT nasal spray Place 2 sprays into both nostrils daily. 03/16/23  Yes Zenia Resides, MD  ACETAMINOPHEN EXTRA STRENGTH 500 MG capsule SMARTSIG:2 Capsule(s) By Mouth Daily PRN 09/30/21   [provider]  amLODipine (NORVASC) 10 MG tablet Take 1 tablet (10 mg total) by mouth daily. 08/04/22   Marcine Matar, MD  bisoprolol (ZEBETA) 10 MG tablet Take 1 tablet (10 mg total) by mouth daily. 03/05/23   Marcine Matar, MD  cyclobenzaprine (FLEXERIL) 5 MG tablet Take 1 tablet (5 mg total) by mouth 2 (two) times daily as needed for muscle spasms. 01/11/23   Marcine Matar, MD  empagliflozin (JARDIANCE) 10 MG TABS tablet Take 1 tablet (10 mg total) by mouth daily before breakfast. Patient not  taking: Reported on 01/11/2023 08/04/22   Marcine Matar, MD  ferrous sulfate 325 (65 FE) MG tablet Take 325 mg by mouth in the morning.    [provider]  gabapentin (NEURONTIN) 300 MG capsule Take 1 capsule (300 mg total) by mouth at bedtime. 08/04/22   Marcine Matar, MD  glipiZIDE (GLUCOTROL) 10 MG tablet Take 1.5 tablets (15 mg total) by mouth 2 (two) times daily before a meal. 01/11/23   Marcine Matar, MD  hydrALAZINE (APRESOLINE) 50 MG tablet Take 1 tablet (50 mg total) by mouth 3 (three) times daily. 08/04/22   Marcine Matar, MD  hydrochlorothiazide (HYDRODIURIL) 25 MG tablet Take 1 tablet (25 mg total) by mouth daily. 08/04/22   Marcine Matar, MD  meloxicam (MOBIC) 15 MG tablet Take 1 tablet (15 mg total) by mouth daily as needed. 02/22/22   Marcine Matar, MD  metFORMIN (GLUCOPHAGE) 1000 MG tablet Take 1 tablet (1,000 mg total) by mouth 2 (two) times daily with a meal. 02/20/22   Shamleffer, Konrad Dolores, MD  olmesartan (BENICAR) 40 MG tablet Take 1 tablet (40 mg total) by mouth daily. 08/04/22   Marcine Matar, MD  QUEtiapine (SEROQUEL) 50 MG tablet Take 1 tablet (50 mg total) by mouth at bedtime. 03/09/23   Lauro Franklin, MD  sertraline (ZOLOFT) 100 MG tablet Take 2 tablets (200 mg total) by mouth daily. 03/09/23   Lauro Franklin, MD  simvastatin (ZOCOR) 20 MG tablet Take 1 tablet (20 mg total) by mouth at bedtime. 08/04/22   Marcine Matar, MD    Family History Family History  Problem Relation Age of Onset   Diabetes Mother    Hypertension Mother    Diabetes Father    Hypertension Father    Endometrial cancer Maternal Aunt    Colon cancer Neg Hx    Breast cancer Neg Hx    Ovarian cancer Neg Hx    Pancreatic cancer Neg Hx    Prostate cancer Neg Hx     Social History Social History   Tobacco Use   Smoking status: Never    Passive exposure: Never   Smokeless tobacco: Never  Vaping Use   Vaping status: Never Used   Substance Use Topics   Alcohol use: Not Currently   Drug use: Never     Allergies   Patient has no known allergies.   Review of Systems Review of Systems   Physical Exam Triage Vital Signs ED Triage Vitals [03/16/23 1142]  Encounter Vitals Group     BP (!) 158/97     Systolic BP Percentile  Diastolic BP Percentile      Pulse Rate 71     Resp 15     Temp 98.4 F (36.9 C)     Temp Source Oral     SpO2 97 %     Weight      Height      Head Circumference      Peak Flow      Pain Score 5     Pain Loc      Pain Education      Exclude from Growth Chart    No data found.  Updated Vital Signs BP (!) 158/97 (BP Location: Right Arm)   Pulse 71   Temp 98.4 F (36.9 C) (Oral)   Resp 15   LMP 04/27/2021 (Exact Date)   SpO2 97%   Visual Acuity Right Eye Distance:   Left Eye Distance:   Bilateral Distance:    Right Eye Near:   Left Eye Near:    Bilateral Near:     Physical Exam Vitals reviewed.  Constitutional:      General: She is not in acute distress.    Appearance: She is not toxic-appearing.  HENT:     Right Ear: Tympanic membrane and ear canal normal.     Left Ear: Tympanic membrane and ear canal normal.     Nose: Congestion present.     Mouth/Throat:     Mouth: Mucous membranes are moist.     Comments: There is white mucus draining Eyes:     Extraocular Movements: Extraocular movements intact.     Conjunctiva/sclera: Conjunctivae normal.     Pupils: Pupils are equal, round, and reactive to light.  Cardiovascular:     Rate and Rhythm: Normal rate and regular rhythm.     Heart sounds: No murmur heard. Pulmonary:     Effort: Pulmonary effort is normal. No respiratory distress.     Breath sounds: No stridor. No wheezing, rhonchi or rales.  Musculoskeletal:     Cervical back: Neck supple.  Lymphadenopathy:     Cervical: No cervical adenopathy.  Skin:    Capillary Refill: Capillary refill takes less than 2 seconds.     Coloration: Skin is not  jaundiced or pale.  Neurological:     General: No focal deficit present.     Mental Status: She is alert and oriented to person, place, and time.  Psychiatric:        Behavior: Behavior normal.      UC Treatments / Results  Labs (all labs ordered are listed, but only abnormal results are displayed) Labs Reviewed - No data to display  EKG   Radiology No results found.  Procedures Procedures (including critical care time)  Medications Ordered in UC Medications - No data to display  Initial Impression / Assessment and Plan / UC Course  I have reviewed the triage vital signs and the nursing notes.  Pertinent labs & imaging results that were available during my care of the patient were reviewed by me and considered in my medical decision making (see chart for details).      Final Clinical Impressions(s) / UC Diagnoses   Final diagnoses:  Acute sinusitis, recurrence not specified, unspecified location     Discharge Instructions      Take doxycycline 100 mg --1 capsule 2 times daily for 7 days  Fluticasone/Flonase nose spray--put 2 sprays in each nostril once daily  Take fluconazole 150 mg--1 tablet every 3 days for 3 doses; do not take  your simvastatin while you are taking this fluconazole, for the next week.  Please follow-up with primary care about this issue      ED Prescriptions     Medication Sig Dispense Auth. Provider   doxycycline (VIBRAMYCIN) 100 MG capsule Take 1 capsule (100 mg total) by mouth 2 (two) times daily for 7 days. 14 capsule Zenia Resides, MD   fluticasone [facility]) 50 MCG/ACT nasal spray Place 2 sprays into both nostrils daily. 16 g Zenia Resides, MD   fluconazole (DIFLUCAN) 150 MG tablet Take 1 tablet (150 mg total) by mouth every 3 (three) days for 3 doses. 3 tablet Margarit Minshall, Janace Aris, MD      PDMP not reviewed this encounter.   Zenia Resides, MD 03/16/23 1226

## 2023-03-16 NOTE — Discharge Instructions (Addendum)
Take doxycycline 100 mg --1 capsule 2 times daily for 7 days  Fluticasone/Flonase nose spray--put 2 sprays in each nostril once daily  Take fluconazole 150 mg--1 tablet every 3 days for 3 doses; do not take your simvastatin while you are taking this fluconazole, for the next week.  Please follow-up with primary care about this issue

## 2023-03-16 NOTE — ED Triage Notes (Signed)
Pt reports beginning of December had sinus infection and had an E visit and prescribed antibiotics. Headache and sinus pressure, nausea, eye redness, reports feels still congested and not draining. Pt had vaginal itching for 3 days, denies discharge.

## 2023-03-29 ENCOUNTER — Encounter: Payer: Self-pay | Admitting: Hematology and Oncology

## 2023-03-29 ENCOUNTER — Other Ambulatory Visit: Payer: Self-pay

## 2023-03-29 ENCOUNTER — Ambulatory Visit: Payer: Self-pay | Attending: Physician Assistant | Admitting: Physician Assistant

## 2023-03-29 VITALS — BP 163/96 | HR 67 | Wt 173.4 lb

## 2023-03-29 DIAGNOSIS — I152 Hypertension secondary to endocrine disorders: Secondary | ICD-10-CM

## 2023-03-29 DIAGNOSIS — Z7984 Long term (current) use of oral hypoglycemic drugs: Secondary | ICD-10-CM

## 2023-03-29 DIAGNOSIS — M5412 Radiculopathy, cervical region: Secondary | ICD-10-CM

## 2023-03-29 DIAGNOSIS — E1169 Type 2 diabetes mellitus with other specified complication: Secondary | ICD-10-CM

## 2023-03-29 DIAGNOSIS — E785 Hyperlipidemia, unspecified: Secondary | ICD-10-CM

## 2023-03-29 DIAGNOSIS — E1165 Type 2 diabetes mellitus with hyperglycemia: Secondary | ICD-10-CM

## 2023-03-29 DIAGNOSIS — Z91199 Patient's noncompliance with other medical treatment and regimen due to unspecified reason: Secondary | ICD-10-CM

## 2023-03-29 DIAGNOSIS — E1159 Type 2 diabetes mellitus with other circulatory complications: Secondary | ICD-10-CM

## 2023-03-29 DIAGNOSIS — M62838 Other muscle spasm: Secondary | ICD-10-CM

## 2023-03-29 DIAGNOSIS — E119 Type 2 diabetes mellitus without complications: Secondary | ICD-10-CM

## 2023-03-29 LAB — POCT URINALYSIS DIP (CLINITEK)
Bilirubin, UA: NEGATIVE
Blood, UA: NEGATIVE
Glucose, UA: >=1000 mg/dL — AB
Ketones, POC UA: NEGATIVE mg/dL
Leukocytes, UA: NEGATIVE
Nitrite, UA: NEGATIVE
POC PROTEIN,UA: NEGATIVE
Spec Grav, UA: 1.02 (ref 1.010–1.025)
Urobilinogen, UA: 0.2 U/dL
pH, UA: 6 (ref 5.0–8.0)

## 2023-03-29 LAB — GLUCOSE, POCT (MANUAL RESULT ENTRY)
POC Glucose: 288 mg/dL — AB (ref 70–99)
POC Glucose: 380 mg/dL — AB (ref 70–99)

## 2023-03-29 MED ORDER — MELOXICAM 15 MG PO TABS
15.0000 mg | ORAL_TABLET | Freq: Every day | ORAL | 2 refills | Status: AC | PRN
  Filled 2023-03-29: qty 30, 30d supply, fill #0

## 2023-03-29 MED ORDER — DAPAGLIFLOZIN PROPANEDIOL 10 MG PO TABS
10.0000 mg | ORAL_TABLET | Freq: Every day | ORAL | 3 refills | Status: DC
  Filled 2023-03-29: qty 30, 30d supply, fill #0
  Filled 2023-05-02 – 2023-05-03 (×3): qty 30, 30d supply, fill #1

## 2023-03-29 MED ORDER — GLIPIZIDE 10 MG PO TABS
15.0000 mg | ORAL_TABLET | Freq: Two times a day (BID) | ORAL | 2 refills | Status: DC
  Filled 2023-03-29: qty 270, 90d supply, fill #0

## 2023-03-29 MED ORDER — CYCLOBENZAPRINE HCL 5 MG PO TABS
5.0000 mg | ORAL_TABLET | Freq: Two times a day (BID) | ORAL | 0 refills | Status: AC | PRN
  Filled 2023-03-29: qty 30, 15d supply, fill #0

## 2023-03-29 MED ORDER — AMLODIPINE BESYLATE 10 MG PO TABS
10.0000 mg | ORAL_TABLET | Freq: Every day | ORAL | 1 refills | Status: DC
  Filled 2023-03-29: qty 90, 90d supply, fill #0
  Filled 2023-05-03 (×2): qty 90, 90d supply, fill #1

## 2023-03-29 MED ORDER — INSULIN ASPART 100 UNIT/ML IJ SOLN
12.0000 [IU] | Freq: Once | INTRAMUSCULAR | Status: AC
  Administered 2023-03-29: 12 [IU] via SUBCUTANEOUS

## 2023-03-29 MED ORDER — GABAPENTIN 300 MG PO CAPS
300.0000 mg | ORAL_CAPSULE | Freq: Every day | ORAL | 1 refills | Status: DC
  Filled 2023-03-29: qty 90, 90d supply, fill #0
  Filled 2023-07-02: qty 90, 90d supply, fill #1

## 2023-03-29 MED ORDER — METFORMIN HCL 1000 MG PO TABS
1000.0000 mg | ORAL_TABLET | Freq: Two times a day (BID) | ORAL | 2 refills | Status: DC
  Filled 2023-03-29: qty 180, 90d supply, fill #0

## 2023-03-29 MED ORDER — SIMVASTATIN 20 MG PO TABS
20.0000 mg | ORAL_TABLET | Freq: Every day | ORAL | 6 refills | Status: DC
  Filled 2023-03-29: qty 30, 30d supply, fill #0
  Filled 2023-05-02: qty 30, 30d supply, fill #1

## 2023-03-29 MED ORDER — BISOPROLOL FUMARATE 10 MG PO TABS
10.0000 mg | ORAL_TABLET | Freq: Every day | ORAL | 0 refills | Status: DC
  Filled 2023-03-29 – 2023-06-06 (×4): qty 90, 90d supply, fill #0

## 2023-03-29 MED ORDER — HYDROCHLOROTHIAZIDE 25 MG PO TABS
25.0000 mg | ORAL_TABLET | Freq: Every day | ORAL | 1 refills | Status: DC
  Filled 2023-03-29: qty 90, 90d supply, fill #0
  Filled 2023-05-02 – 2023-07-02 (×4): qty 90, 90d supply, fill #1

## 2023-03-29 MED ORDER — OLMESARTAN MEDOXOMIL 40 MG PO TABS
40.0000 mg | ORAL_TABLET | Freq: Every day | ORAL | 3 refills | Status: DC
  Filled 2023-03-29: qty 30, 30d supply, fill #0
  Filled 2023-05-02: qty 30, 30d supply, fill #1
  Filled 2023-07-02: qty 30, 30d supply, fill #2
  Filled 2023-08-02: qty 30, 30d supply, fill #3

## 2023-03-29 MED ORDER — HYDRALAZINE HCL 50 MG PO TABS
50.0000 mg | ORAL_TABLET | Freq: Three times a day (TID) | ORAL | 4 refills | Status: DC
  Filled 2023-03-29 (×2): qty 90, 30d supply, fill #0
  Filled 2023-05-02: qty 90, 30d supply, fill #1

## 2023-03-29 NOTE — Progress Notes (Signed)
 Patient ID: Crystal Vasquez, female   DOB: [birth year], 53 y.o.   MRN: [id number]   Berlene Dixson, is a 53 y.o. female  RDW:[id number]  FMW:[id number]  DOB - February 24, 1971  Chief Complaint  Patient presents with   Urinary Incontinence   Back Pain   Medication Refill       Subjective:   Crystal Vasquez is a 53 y.o. female here today for med RF.  Currently living in her car by choice.  Says she does not need food or blankets.  She is working and may be getting some insurance soon.  She can not afford jardiance  but has not turned in needed paperwork to help get it at no/little cost to her.  She takes glipizide  and metformin  about 75% of the time.  Does not follow diabetic diet.  Has been out of meds for several days.  Denies HA/dizziness/CP.      No problems updated.  ALLERGIES: No Known Allergies  PAST MEDICAL HISTORY: Past Medical History:  Diagnosis Date   Anemia    Asthma    due to acid reflux   Depression    DM2    Dyspnea    GERD (gastroesophageal reflux disease)    Headache    Heart murmur    benign   Hypertension    Obesity (BMI 30-39.9)    Thyroid  disease     MEDICATIONS AT HOME: Prior to Admission medications   Medication Sig Start Date End Date Taking? Authorizing Provider  dapagliflozin  propanediol (FARXIGA ) 10 MG TABS tablet Take 1 tablet (10 mg total) by mouth daily before breakfast. 03/29/23  Yes Rechel Delosreyes M, PA-C  ACETAMINOPHEN  EXTRA STRENGTH 500 MG capsule SMARTSIG:2 Capsule(s) By Mouth Daily PRN 09/30/21   [provider]  amLODipine  (NORVASC ) 10 MG tablet Take 1 tablet (10 mg total) by mouth daily. 03/29/23   Danton Jon HERO, PA-C  bisoprolol  (ZEBETA ) 10 MG tablet Take 1 tablet (10 mg total) by mouth daily. 03/29/23   Danton Jon HERO, PA-C  cyclobenzaprine  (FLEXERIL ) 5 MG tablet Take 1 tablet (5 mg total) by mouth 2 (two) times daily as needed for muscle spasms. 03/29/23   Danton Jon HERO, PA-C  ferrous sulfate  325 (65 FE) MG  tablet Take 325 mg by mouth in the morning.    [provider]  fluticasone  (FLONASE ) 50 MCG/ACT nasal spray Place 2 sprays into both nostrils daily. 03/16/23   Vonna Sharlet POUR, MD  gabapentin  (NEURONTIN ) 300 MG capsule Take 1 capsule (300 mg total) by mouth at bedtime. 03/29/23   Mckennon Zwart M, PA-C  glipiZIDE  (GLUCOTROL ) 10 MG tablet Take 1.5 tablets (15 mg total) by mouth 2 (two) times daily before a meal. 03/29/23   Zameria Vogl, Jon HERO, PA-C  hydrALAZINE  (APRESOLINE ) 50 MG tablet Take 1 tablet (50 mg total) by mouth 3 (three) times daily. 03/29/23   Danton Jon HERO, PA-C  hydrochlorothiazide  (HYDRODIURIL ) 25 MG tablet Take 1 tablet (25 mg total) by mouth daily. 03/29/23   Danton Jon HERO, PA-C  meloxicam  (MOBIC ) 15 MG tablet Take 1 tablet (15 mg total) by mouth daily as needed. 03/29/23   Danton Jon HERO, PA-C  metFORMIN  (GLUCOPHAGE ) 1000 MG tablet Take 1 tablet (1,000 mg total) by mouth 2 (two) times daily with a meal. 03/29/23   Hamdi Kley, Jon HERO, PA-C  olmesartan  (BENICAR ) 40 MG tablet Take 1 tablet (40 mg total) by mouth daily. 03/29/23   Danton Jon HERO, PA-C  QUEtiapine  (SEROQUEL ) 50 MG tablet Take 1 tablet (  50 mg total) by mouth at bedtime. 03/09/23   Raliegh Marsa RAMAN, MD  sertraline  (ZOLOFT ) 100 MG tablet Take 2 tablets (200 mg total) by mouth daily. 03/09/23   Raliegh Marsa RAMAN, MD  simvastatin  (ZOCOR ) 20 MG tablet Take 1 tablet (20 mg total) by mouth at bedtime. 03/29/23   Andjela Wickes, Jon HERO, PA-C    ROS: Neg HEENT Neg resp Neg cardiac Neg GI Neg GU Neg MS Neg psych Neg neuro  Objective:   Vitals:   03/29/23 1020  BP: (!) 177/105  Pulse: 75  SpO2: 98%  Weight: 173 lb 6.4 oz (78.7 kg)   Exam General appearance : Awake, alert, not in any distress. Speech Clear. Not toxic looking HEENT: Atraumatic and Normocephalic Neck: Supple, no JVD. No cervical lymphadenopathy.  Chest: Good air entry bilaterally, CTAB.  No rales/rhonchi/wheezing CVS: S1 S2 regular,  no murmurs.  Extremities: B/L Lower Ext shows no edema, both legs are warm to touch Neurology: Awake alert, and oriented X 3, CN II-XII intact, Non focal Skin: No Rash  Data Review Lab Results  Component Value Date   HGBA1C 7.7 (A) 01/11/2023   HGBA1C 8.3 (A) 08/04/2022   HGBA1C 7.8 (A) 01/24/2022    Assessment & Plan   1. Type 2 diabetes mellitus with other specified complication, without long-term current use of insulin  (HCC) (Primary) Uncontrolled.  Not compliant.  Taking meds about 75% of the time.  Suggested timers, alarms, reminders for meds. Consulted with pharmacy.  She does not have jardiance  bc she did not submit paperwork with pharmacy.  Pharmacy recommended farxiga  since injectable may not be appropriate since no storage.    - Glucose (CBG) - POCT URINALYSIS DIP (CLINITEK) - insulin  aspart (novoLOG ) injection 12 Units - glipiZIDE  (GLUCOTROL ) 10 MG tablet; Take 1.5 tablets (15 mg total) by mouth 2 (two) times daily before a meal.  Dispense: 270 tablet; Refill: 2 - dapagliflozin  propanediol (FARXIGA ) 10 MG TABS tablet; Take 1 tablet (10 mg total) by mouth daily before breakfast.  Dispense: 30 tablet; Refill: 3  2. Diabetes mellitus treated with oral medication (HCC) See #1 - Glucose (CBG) - POCT URINALYSIS DIP (CLINITEK)  3. Uncontrolled type 2 diabetes mellitus with hyperglycemia (HCC) Keep appt with PCP for A1C, bloodwork - metFORMIN  (GLUCOPHAGE ) 1000 MG tablet; Take 1 tablet (1,000 mg total) by mouth 2 (two) times daily with a meal.  Dispense: 180 tablet; Refill: 2 - dapagliflozin  propanediol (FARXIGA ) 10 MG TABS tablet; Take 1 tablet (10 mg total) by mouth daily before breakfast.  Dispense: 30 tablet; Refill: 3  4. Hypertension associated with type 2 diabetes mellitus (HCC) Second BP 163/96 - hydrALAZINE  (APRESOLINE ) 50 MG tablet; Take 1 tablet (50 mg total) by mouth 3 (three) times daily.  Dispense: 90 tablet; Refill: 4 - olmesartan  (BENICAR ) 40 MG tablet; Take 1  tablet (40 mg total) by mouth daily.  Dispense: 90 tablet; Refill: 3 - amLODipine  (NORVASC ) 10 MG tablet; Take 1 tablet (10 mg total) by mouth daily.  Dispense: 90 tablet; Refill: 1 - hydrochlorothiazide  (HYDRODIURIL ) 25 MG tablet; Take 1 tablet (25 mg total) by mouth daily.  Dispense: 90 tablet; Refill: 1 - bisoprolol  (ZEBETA ) 10 MG tablet; Take 1 tablet (10 mg total) by mouth daily.  Dispense: 90 tablet; Refill: 0  5. Hyperlipidemia associated with type 2 diabetes mellitus (HCC) - simvastatin  (ZOCOR ) 20 MG tablet; Take 1 tablet (20 mg total) by mouth at bedtime.  Dispense: 30 tablet; Refill: 6  6. Cervical radiculopathy - gabapentin  (NEURONTIN )  300 MG capsule; Take 1 capsule (300 mg total) by mouth at bedtime.  Dispense: 90 capsule; Refill: 1 - meloxicam  (MOBIC ) 15 MG tablet; Take 1 tablet (15 mg total) by mouth daily as needed.  Dispense: 30 tablet; Refill: 2 - cyclobenzaprine  (FLEXERIL ) 5 MG tablet; Take 1 tablet (5 mg total) by mouth 2 (two) times daily as needed for muscle spasms.  Dispense: 30 tablet; Refill: 0  7. Muscle spasm - meloxicam  (MOBIC ) 15 MG tablet; Take 1 tablet (15 mg total) by mouth daily as needed.  Dispense: 30 tablet; Refill: 2 - cyclobenzaprine  (FLEXERIL ) 5 MG tablet; Take 1 tablet (5 mg total) by mouth 2 (two) times daily as needed for muscle spasms.  Dispense: 30 tablet; Refill: 0  8. Noncompliance Partially compliant but has not followed through with paper work provided for [employer], blankets, hot hands and she declined needing them.  Living in car  Return for keep next appt with Dr Suzi for A1C.  The patient was given clear instructions to go to ER or return to medical center if symptoms don't improve, worsen or new problems develop. The patient verbalized understanding. The patient was told to call to get lab results if they haven't heard anything in the next week.      Jon Moores, PA-C [facility] and [facility]  [place], [place] [phone]   03/29/2023, 11:05 AM

## 2023-03-30 ENCOUNTER — Other Ambulatory Visit: Payer: Self-pay

## 2023-04-05 ENCOUNTER — Other Ambulatory Visit: Payer: Self-pay

## 2023-04-05 ENCOUNTER — Encounter: Payer: Self-pay | Admitting: Hematology and Oncology

## 2023-04-06 ENCOUNTER — Other Ambulatory Visit: Payer: Self-pay

## 2023-04-13 ENCOUNTER — Telehealth: Payer: Self-pay

## 2023-04-13 DIAGNOSIS — T300 Burn of unspecified body region, unspecified degree: Secondary | ICD-10-CM

## 2023-04-14 ENCOUNTER — Encounter: Payer: Self-pay | Admitting: Nurse Practitioner
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
## 2023-04-14 NOTE — Progress Notes (Signed)
I have spent 5 minutes in review of e-visit questionnaire, review and updating patient chart, medical decision making and response to patient.   Claiborne Rigg, NP

## 2023-04-18 ENCOUNTER — Other Ambulatory Visit: Payer: Self-pay

## 2023-04-18 ENCOUNTER — Ambulatory Visit
Admission: EM | Admit: 2023-04-18 | Discharge: 2023-04-18 | Disposition: A | Discharge status: Home / Self Care | Payer: Self-pay | Attending: Family Medicine | Admitting: Family Medicine

## 2023-04-18 ENCOUNTER — Telehealth: Payer: Self-pay | Admitting: Physician Assistant

## 2023-04-18 ENCOUNTER — Encounter: Payer: Self-pay | Admitting: Hematology and Oncology

## 2023-04-18 DIAGNOSIS — T25229A Burn of second degree of unspecified foot, initial encounter: Secondary | ICD-10-CM

## 2023-04-18 DIAGNOSIS — Z23 Encounter for immunization: Secondary | ICD-10-CM

## 2023-04-18 DIAGNOSIS — T25221A Burn of second degree of right foot, initial encounter: Secondary | ICD-10-CM

## 2023-04-18 DIAGNOSIS — T25222A Burn of second degree of left foot, initial encounter: Secondary | ICD-10-CM

## 2023-04-18 MED ORDER — TETANUS-DIPHTH-ACELL PERTUSSIS 5-2.5-18.5 LF-MCG/0.5 IM SUSY
0.5000 mL | PREFILLED_SYRINGE | Freq: Once | INTRAMUSCULAR | Status: AC
  Administered 2023-04-18: 0.5 mL via INTRAMUSCULAR

## 2023-04-18 MED ORDER — CEPHALEXIN 500 MG PO CAPS
500.0000 mg | ORAL_CAPSULE | Freq: Four times a day (QID) | ORAL | 0 refills | Status: AC
  Filled 2023-04-18: qty 28, 7d supply, fill #0
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
## 2023-04-18 NOTE — Progress Notes (Signed)
  Because of ongoing pain at site of burn despite treatment and recommendations via e-visit encounter 5 days ago, I feel your condition warrants further evaluation and I recommend that you follow-up in person with your PCP.  If it is painful at rest, then walking on it will likely exacerbate the discomfort. I highly recommend an in person follow-up to make sure you are taken care of.    NOTE: There will be NO CHARGE for this E-Visit   If you are having a true medical emergency, please call 911.     For an urgent face to face visit, [facility] has multiple urgent care centers for your convenience.  Click the link below for the full list of locations and hours, walk-in wait times, appointment scheduling options and driving directions:  Urgent Care - [place], [place], [place], [place], [place], [place]  [place]     Your MyChart E-visit questionnaire answers were reviewed by a board certified advanced clinical practitioner to complete your personal care plan based on your specific symptoms.    Thank you for using e-Visits.

## 2023-04-20 ENCOUNTER — Encounter (HOSPITAL_COMMUNITY): Payer: Self-pay | Admitting: Student in an Organized Health Care Education/Training Program

## 2023-04-20 ENCOUNTER — Ambulatory Visit (INDEPENDENT_AMBULATORY_CARE_PROVIDER_SITE_OTHER): Payer: No Payment, Other | Admitting: Student in an Organized Health Care Education/Training Program

## 2023-04-20 ENCOUNTER — Other Ambulatory Visit: Payer: Self-pay

## 2023-04-20 DIAGNOSIS — F331 Major depressive disorder, recurrent, moderate: Secondary | ICD-10-CM

## 2023-04-20 DIAGNOSIS — F4310 Post-traumatic stress disorder, unspecified: Secondary | ICD-10-CM | POA: Diagnosis not present

## 2023-04-20 DIAGNOSIS — F411 Generalized anxiety disorder: Secondary | ICD-10-CM | POA: Diagnosis not present

## 2023-04-20 MED ORDER — SERTRALINE HCL 100 MG PO TABS
200.0000 mg | ORAL_TABLET | Freq: Every day | ORAL | 2 refills | Status: DC
  Filled 2023-04-20 – 2023-05-02 (×2): qty 60, 30d supply, fill #0
  Filled 2023-06-06: qty 60, 30d supply, fill #1

## 2023-04-20 MED ORDER — QUETIAPINE FUMARATE 50 MG PO TABS
50.0000 mg | ORAL_TABLET | Freq: Every day | ORAL | 2 refills | Status: DC
  Filled 2023-04-20 – 2023-05-02 (×2): qty 30, 30d supply, fill #0
  Filled 2023-06-06: qty 30, 30d supply, fill #1
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
## 2023-05-02 ENCOUNTER — Encounter: Payer: Self-pay | Admitting: Hematology and Oncology

## 2023-05-02 ENCOUNTER — Other Ambulatory Visit: Payer: Self-pay

## 2023-05-02 ENCOUNTER — Telehealth: Payer: Self-pay | Admitting: Physician Assistant

## 2023-05-02 DIAGNOSIS — R110 Nausea: Secondary | ICD-10-CM

## 2023-05-02 MED ORDER — ONDANSETRON 4 MG PO TBDP
4.0000 mg | ORAL_TABLET | Freq: Three times a day (TID) | ORAL | 0 refills | Status: AC | PRN
  Filled 2023-05-02: qty 20, 7d supply, fill #0

## 2023-05-02 NOTE — Progress Notes (Signed)
E-Visit for Vomiting  We are sorry that you are not feeling well. Here is how we plan to help!  Based on what you have shared with me it looks like you have a Virus that is irritating your GI tract.  Vomiting is the forceful emptying of a portion of the stomach's content through the mouth.  Although nausea and vomiting can make you feel miserable, it's important to remember that these are not diseases, but rather symptoms of an underlying illness.  When we treat short term symptoms, we always caution that any symptoms that persist should be fully evaluated in a medical office.  I have prescribed a medication that will help alleviate your symptoms and allow you to stay hydrated:  Zofran 4 mg 1 tablet every 8 hours as needed for nausea and vomiting  HOME CARE: Drink clear liquids.  This is very important! Dehydration (the lack of fluid) can lead to a serious complication.  Start off with 1 tablespoon every 5 minutes for 8 hours. You may begin eating bland foods after 8 hours without vomiting.  Start with saltine crackers, white bread, rice, mashed potatoes, applesauce. After 48 hours on a bland diet, you may resume a normal diet. Try to go to sleep.  Sleep often empties the stomach and relieves the need to vomit.  GET HELP RIGHT AWAY IF:  Your symptoms do not improve or worsen within 2 days after treatment. You have a fever for over 3 days. You cannot keep down fluids after trying the medication.  MAKE SURE YOU:  Understand these instructions. Will watch your condition. Will get help right away if you are not doing well or get worse.   Thank you for choosing an e-visit.  Your e-visit answers were reviewed by a board certified advanced clinical practitioner to complete your personal care plan. Depending upon the condition, your plan could have included both over the counter or prescription medications.  Please review your pharmacy choice. Make sure the pharmacy is open so you can pick  up prescription now. If there is a problem, you may contact your provider through [employer] and have the prescription routed to another pharmacy.  Your safety is important to [place]. If you have drug allergies check your prescription carefully.   For the next 24 hours you can use MyChart to ask questions about today's visit, request a non-urgent call back, or ask for a work or school excuse. You will get an email in the next two days asking about your experience. I hope that your e-visit has been valuable and will speed your recovery.  I have spent 5 minutes in review of e-visit questionnaire, review and updating patient chart, medical decision making and response to patient.   Kasandra Knudsen Mayers, PA-C

## 2023-05-03 ENCOUNTER — Telehealth: Payer: Self-pay

## 2023-05-03 ENCOUNTER — Other Ambulatory Visit: Payer: Self-pay

## 2023-05-03 ENCOUNTER — Encounter: Payer: Self-pay | Admitting: Hematology and Oncology

## 2023-05-03 NOTE — Telephone Encounter (Signed)
Follow-up call placed to patient today regarding [employer] enrollment. No record of receiving patient portion of [place] application which was given to patient 03/29/2023. Provider portion has been completed. Waiting on patient to submit application for processing.

## 2023-05-14 ENCOUNTER — Other Ambulatory Visit: Payer: Self-pay

## 2023-05-15 ENCOUNTER — Other Ambulatory Visit: Payer: Self-pay

## 2023-05-17 ENCOUNTER — Other Ambulatory Visit: Payer: Self-pay

## 2023-05-17 ENCOUNTER — Encounter: Payer: Self-pay | Admitting: Internal Medicine

## 2023-05-17 ENCOUNTER — Encounter: Payer: Self-pay | Admitting: Hematology and Oncology

## 2023-05-17 ENCOUNTER — Ambulatory Visit: Payer: Self-pay | Attending: Internal Medicine | Admitting: Internal Medicine

## 2023-05-17 VITALS — BP 163/90 | HR 76 | Temp 98.5°F | Ht 66.0 in | Wt 169.0 lb

## 2023-05-17 DIAGNOSIS — Z5900 Homelessness unspecified: Secondary | ICD-10-CM

## 2023-05-17 DIAGNOSIS — Z1231 Encounter for screening mammogram for malignant neoplasm of breast: Secondary | ICD-10-CM

## 2023-05-17 DIAGNOSIS — E785 Hyperlipidemia, unspecified: Secondary | ICD-10-CM

## 2023-05-17 DIAGNOSIS — I152 Hypertension secondary to endocrine disorders: Secondary | ICD-10-CM

## 2023-05-17 DIAGNOSIS — Z23 Encounter for immunization: Secondary | ICD-10-CM

## 2023-05-17 DIAGNOSIS — E1169 Type 2 diabetes mellitus with other specified complication: Secondary | ICD-10-CM

## 2023-05-17 DIAGNOSIS — M25531 Pain in right wrist: Secondary | ICD-10-CM

## 2023-05-17 DIAGNOSIS — R632 Polyphagia: Secondary | ICD-10-CM

## 2023-05-17 DIAGNOSIS — Z7984 Long term (current) use of oral hypoglycemic drugs: Secondary | ICD-10-CM

## 2023-05-17 DIAGNOSIS — E1159 Type 2 diabetes mellitus with other circulatory complications: Secondary | ICD-10-CM

## 2023-05-17 DIAGNOSIS — E119 Type 2 diabetes mellitus without complications: Secondary | ICD-10-CM

## 2023-05-17 DIAGNOSIS — R197 Diarrhea, unspecified: Secondary | ICD-10-CM

## 2023-05-17 LAB — POCT GLYCOSYLATED HEMOGLOBIN (HGB A1C): HbA1c, POC (controlled diabetic range): 8.3 % — AB (ref 0.0–7.0)

## 2023-05-17 LAB — GLUCOSE, POCT (MANUAL RESULT ENTRY): POC Glucose: 131 mg/dL — AB (ref 70–99)

## 2023-05-17 MED ORDER — AMLODIPINE BESYLATE 10 MG PO TABS
10.0000 mg | ORAL_TABLET | Freq: Every day | ORAL | 1 refills | Status: DC
  Filled 2023-05-17 – 2023-08-02 (×3): qty 90, 90d supply, fill #0

## 2023-05-17 MED ORDER — HYDRALAZINE HCL 50 MG PO TABS
75.0000 mg | ORAL_TABLET | Freq: Three times a day (TID) | ORAL | 6 refills | Status: DC
  Filled 2023-05-17 – 2023-06-06 (×2): qty 135, 30d supply, fill #0
  Filled 2023-08-02: qty 135, 30d supply, fill #1

## 2023-05-17 MED ORDER — METFORMIN HCL ER 500 MG PO TB24
1000.0000 mg | ORAL_TABLET | Freq: Every day | ORAL | 1 refills | Status: DC
  Filled 2023-05-17: qty 180, 90d supply, fill #0
  Filled 2023-06-06 – 2023-06-15 (×2): qty 180, 90d supply, fill #1

## 2023-05-17 MED ORDER — PEN NEEDLES 31G X 8 MM MISC
6 refills | Status: DC
  Filled 2023-05-17: qty 100, 100d supply, fill #0
  Filled 2023-06-06: qty 100, 100d supply, fill #1

## 2023-05-17 MED ORDER — LANTUS SOLOSTAR 100 UNIT/ML ~~LOC~~ SOPN
6.0000 [IU] | PEN_INJECTOR | Freq: Every day | SUBCUTANEOUS | 99 refills | Status: DC
  Filled 2023-05-17: qty 3, 50d supply, fill #0
  Filled 2023-06-06: qty 3, 50d supply, fill #1
  Filled 2023-07-02: qty 3, 28d supply, fill #1
  Filled 2023-08-02: qty 3, 28d supply, fill #2

## 2023-05-17 MED ORDER — ZOSTER VAC RECOMB ADJUVANTED 50 MCG/0.5ML IM SUSR
0.5000 mL | Freq: Once | INTRAMUSCULAR | 1 refills | Status: AC
Start: 2023-05-17 — End: 2023-05-18

## 2023-05-17 NOTE — Patient Instructions (Addendum)
 Increase hydralazine to 75 mg 3 times a day.  Start Lantus insulin 6 units at bedtime.  After 4 days, if you are still waking up with blood sugars greater than 150, increase the insulin to 8 units daily.  We have changed metformin to extended release 1000 mg daily to see if it would help decrease the diarrhea.  Use Imodium as needed which can be purchased over-the-counter.  Try to avoid having your cat scratch you because it can cause skin infection.  Use the meloxicam daily for the next 1 to 2 weeks to decrease the inflammation in the right wrist.  Hypoglycemia Hypoglycemia is when the amount of sugar, or glucose, in your blood is too low. Low blood sugar can happen if you have diabetes or if you don't have diabetes. It may be an emergency. What are the causes? Low blood sugar happens most often in people who have diabetes. It may be caused by: Diabetes medicine. Not eating enough, or not eating often enough. Being more active than normal. If you don't have diabetes, you may still get low blood sugar if: There's a tumor in your pancreas. A tumor is a growth of cells that isn't normal. You don't eat enough, or you fast. Fasting is when you don't eat for long periods at a time. You have a bad infection or illness. You have problems after weight loss surgery. You have kidney or liver problems. You take certain medicines. What increases the risk? You're more likely to have low blood sugar if: You have diabetes and take medicine for it. You drink a lot of alcohol. You get sick. What are the signs or symptoms? Mild Hunger or feeling like you may vomit. Sweating and feeling cold to the touch. Feeling dizzy or light-headed. Being sleepy or having trouble sleeping. A headache. Blurry vision. Mood changes. These include feeling worried, nervous, or easily annoyed. Moderate Feeling confused. Changes in the way you act. Weakness. An uneven heartbeat. Very bad Having very low blood  sugar is an emergency. It can cause: Fainting. Seizures. A coma. Death. How is this diagnosed?  Low blood sugar can be found with a blood test. This test tells you how much sugar is in your blood. It's done while you're having symptoms. Your health care provider may also do an exam and look at your medical history. How is this treated? Treating low blood sugar If you have low blood sugar, eat or drink something with sugar in it right away. The food or drink should have 15 grams of a fast-acting carbohydrate (carb). Options include: 4 oz (120 mL) of fruit juice. 4 oz (120 mL) of soda (not diet soda). A few pieces of hard candy. Check food labels to see how many pieces to eat. 1 Tbsp (15 mL) of sugar or honey. 4 glucose tablets. 1 tube of glucose gel. Treating low blood sugar if you have diabetes Talk with your provider about how much carb you should take. If you're alert and can swallow safely, you may follow the 15:15 rule: Take 15 grams of a fast-acting carb. Check your blood sugar 15 minutes after you take the carb. If your blood sugar is still at or below 70 mg/dL (3.9 mmol/L), take 15 grams of a carb again. If your blood sugar doesn't go above 70 mg/dL (3.9 mmol/L) after 3 tries, get help right away. After your blood sugar goes back to normal, eat a meal or a snack within 1 hour. Always keep 15 grams of  a fast-acting carb with you. This could be: 4 glucose tablets. A few pieces of hard candy. 1 Tbsp (15 mL) of honey or sugar. 1 tube of glucose gel. Treating very low blood sugar If your blood sugar is less than 54 mg/dL (3 mmol/L), it's an emergency. Get help right away. If you can't eat or drink, you will need to be given glucagon. A family member or friend should learn how to check your blood sugar and give you glucagon. Ask your provider if you should keep a glucagon kit at home. You may also need to be treated in a hospital. Follow these instructions at home: If you have  diabetes: Always keep a fast-acting carb (15 grams) with you. Follow your diabetes care plan. Make sure you: Know the symptoms of low blood sugar. Check your blood sugar as often as told. Always check it before and after you exercise. Always check your blood sugar before you drive. Take your medicines as told. Eat on time. Do not skip meals. Share your diabetes care plan with: Your work or school. The people you live with. Wear an alert bracelet or carry a card that says you have diabetes. General instructions If you drink alcohol: Limit how much you have to: 0-1 drink a day if you're female. 0-2 drinks a day if you're female. Know how much alcohol is in your drink. In the [place] one drink is one 12 oz bottle of beer (355 mL), one 5 oz glass of wine (148 mL), or one 1 oz glass of hard liquor (44 mL). Be sure to eat food when you drink alcohol. Be sure to check your blood sugar after you drink. Alcohol may lead to low blood sugar later. Where to find more information American Diabetes Association (ADA): diabetes.org Contact a health care provider if: You have low blood sugar often. You have diabetes and are having trouble keeping your blood sugar in the right range. Get help right away if: You can't get your blood sugar above 70 mg/dL (3.9 mmol/L) after 3 tries. Your blood sugar is below 54 mg/dL (3 mmol/L). You have a seizure. You faint. These symptoms may be an emergency. Call 911 right away. Do not wait to see if the symptoms will go away. Do not drive yourself to the hospital. This information is not intended to replace advice given to you by your health care provider. Make sure you discuss any questions you have with your health care provider. Document Revised: 12/07/2022 Document Reviewed: 05/24/2022 Elsevier Patient Education  2024 [employer].
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
## 2023-05-18 ENCOUNTER — Other Ambulatory Visit: Payer: Self-pay

## 2023-05-18 LAB — COMPREHENSIVE METABOLIC PANEL
ALT: 19 [IU]/L (ref 0–32)
AST: 18 [IU]/L (ref 0–40)
Albumin: 4.7 g/dL (ref 3.8–4.9)
Alkaline Phosphatase: 87 [IU]/L (ref 44–121)
BUN/Creatinine Ratio: 19 (ref 9–23)
BUN: 12 mg/dL (ref 6–24)
Bilirubin Total: 0.3 mg/dL (ref 0.0–1.2)
CO2: 22 mmol/L (ref 20–29)
Calcium: 9.9 mg/dL (ref 8.7–10.2)
Chloride: 104 mmol/L (ref 96–106)
Creatinine, Ser: 0.63 mg/dL (ref 0.57–1.00)
Globulin, Total: 3.1 g/dL (ref 1.5–4.5)
Glucose: 81 mg/dL (ref 70–99)
Potassium: 4.3 mmol/L (ref 3.5–5.2)
Sodium: 143 mmol/L (ref 134–144)
Total Protein: 7.8 g/dL (ref 6.0–8.5)
eGFR: 106 mL/min/{1.73_m2} (ref >59)

## 2023-05-18 LAB — CBC
Hematocrit: 44.5 % (ref 34.0–46.6)
Hemoglobin: 14.8 g/dL (ref 11.1–15.9)
MCH: 29.7 pg (ref 26.6–33.0)
MCHC: 33.3 g/dL (ref 31.5–35.7)
MCV: 89 fL (ref 79–97)
Platelets: 233 10*3/uL (ref 150–450)
RBC: 4.98 x10E6/uL (ref 3.77–5.28)
RDW: 12.9 % (ref 11.7–15.4)
WBC: 5 10*3/uL (ref 3.4–10.8)

## 2023-05-18 LAB — LIPID PANEL
Chol/HDL Ratio: 4.1 {ratio} (ref 0.0–4.4)
Cholesterol, Total: 253 mg/dL — ABNORMAL HIGH (ref 100–199)
HDL: 62 mg/dL (ref >39)
LDL Chol Calc (NIH): 150 mg/dL — ABNORMAL HIGH (ref 0–99)
Triglycerides: 225 mg/dL — ABNORMAL HIGH (ref 0–149)
VLDL Cholesterol Cal: 41 mg/dL — ABNORMAL HIGH (ref 5–40)

## 2023-05-21 ENCOUNTER — Telehealth: Payer: Self-pay

## 2023-05-21 NOTE — Telephone Encounter (Addendum)
 Telephoned patient at mobile number. Left a voice message with [facility] (scholarship) contact information.  Telephoned patient at mobile number. Left a voice message with [facility] (scholarship) contact information. 08/20/2023

## 2023-05-25 ENCOUNTER — Encounter: Payer: Self-pay | Admitting: *Deleted

## 2023-05-28 ENCOUNTER — Telehealth: Payer: Self-pay

## 2023-05-28 MED ORDER — ATORVASTATIN CALCIUM 40 MG PO TABS
40.0000 mg | ORAL_TABLET | Freq: Every day | ORAL | 1 refills | Status: DC
  Filled 2023-05-28 – 2023-07-03 (×3): qty 90, 90d supply, fill #0

## 2023-05-28 NOTE — Telephone Encounter (Signed)
 Copied from CRM [id number]. Topic: Clinical - Lab/Test Results >> May 28, 2023 12:14 PM Gaetano Hawthorne wrote: Reason for CRM: Patient missed a call from the nurse in regards to lab results - I relayed the following message from Dr. Laural Benes:  LDL cholesterol which is the bad cholesterol that increases risk for heart attack and stroke is 150.  Goal is to be less than 70.  If she has been taking simvastatin consistently, I recommend that we change to 1 called atorvastatin 40 mg daily.  This will work better in lowering cholesterol.  If she is willing to make the change, I will send the prescription to our pharmacy. -Blood cell counts are normal. -Kidney and liver function tests are good.  Patient had no questions and she is in agreement with switching to the new medication - Please call that into the [facility] pharmacy listed in her preferred pharmacies.

## 2023-05-28 NOTE — Addendum Note (Signed)
 Addended by: Jonah Blue B on: 05/28/2023 09:46 PM   Modules accepted: Orders

## 2023-05-29 ENCOUNTER — Encounter: Payer: Self-pay | Admitting: Hematology and Oncology

## 2023-05-29 ENCOUNTER — Other Ambulatory Visit: Payer: Self-pay

## 2023-06-06 ENCOUNTER — Encounter: Payer: Self-pay | Admitting: Hematology and Oncology

## 2023-06-06 ENCOUNTER — Other Ambulatory Visit: Payer: Self-pay

## 2023-06-07 ENCOUNTER — Other Ambulatory Visit: Payer: Self-pay

## 2023-06-14 ENCOUNTER — Other Ambulatory Visit: Payer: Self-pay

## 2023-06-15 ENCOUNTER — Other Ambulatory Visit: Payer: Self-pay

## 2023-06-15 ENCOUNTER — Ambulatory Visit (INDEPENDENT_AMBULATORY_CARE_PROVIDER_SITE_OTHER): Payer: No Payment, Other | Admitting: Student in an Organized Health Care Education/Training Program

## 2023-06-15 ENCOUNTER — Encounter (HOSPITAL_COMMUNITY): Payer: Self-pay | Admitting: Student in an Organized Health Care Education/Training Program

## 2023-06-15 ENCOUNTER — Encounter: Payer: Self-pay | Admitting: Hematology and Oncology

## 2023-06-15 VITALS — BP 146/83 | HR 82 | Ht 66.0 in | Wt 169.6 lb

## 2023-06-15 DIAGNOSIS — F331 Major depressive disorder, recurrent, moderate: Secondary | ICD-10-CM | POA: Diagnosis not present

## 2023-06-15 DIAGNOSIS — F411 Generalized anxiety disorder: Secondary | ICD-10-CM

## 2023-06-15 DIAGNOSIS — F4310 Post-traumatic stress disorder, unspecified: Secondary | ICD-10-CM

## 2023-06-15 MED ORDER — SERTRALINE HCL 100 MG PO TABS
200.0000 mg | ORAL_TABLET | Freq: Every day | ORAL | 2 refills | Status: DC
  Filled 2023-06-15: qty 60, 30d supply, fill #0
  Filled 2023-07-02 – 2023-08-02 (×2): qty 60, 30d supply, fill #1
  Filled 2023-08-22 – 2023-08-23 (×2): qty 60, 30d supply, fill #2

## 2023-06-15 MED ORDER — HYDROXYZINE HCL 10 MG PO TABS
10.0000 mg | ORAL_TABLET | Freq: Three times a day (TID) | ORAL | 2 refills | Status: AC | PRN
  Filled 2023-06-15: qty 90, 30d supply, fill #0
  Filled 2023-07-02 – 2023-08-02 (×2): qty 90, 30d supply, fill #1
  Filled 2023-08-22: qty 90, 30d supply, fill #2

## 2023-06-15 MED ORDER — QUETIAPINE FUMARATE 50 MG PO TABS
50.0000 mg | ORAL_TABLET | Freq: Every day | ORAL | 2 refills | Status: DC
Start: 2023-06-15 — End: 2023-11-01
  Filled 2023-07-02 – 2023-08-02 (×2): qty 30, 30d supply, fill #0

## 2023-06-15 NOTE — Progress Notes (Addendum)
 [facility] MD/PA/NP OP Progress Note  06/15/2023 9:33 AM [name]  MRN:  [id number]  Chief Complaint:  Chief Complaint  Patient presents with   Follow-up   Anxiety   HPI:  Crystal Vasquez is a 53 yr old female who presents for Follow Up and Medication Management.  PPHx is significant for Depression, Anxiety, PTSD and possible ADHD, no history of Suicide Attempts, Self Injurious Behavior, or Psychiatric Hospitalizations.    She reports she has had a little more trouble/stress since her last appointment.  She reports that her car broke down which has been difficult.  She reports she was able to get a towed near her [employer] so that she is able to get to work easier.  She reports that the coworkers at the [employer] are very talkative which is difficult for her as she tends to enjoy quieter environments.  He reports that this has caused her anxiety to increase quite a bit.  She reports that her diabetes has been more difficult to manage and has been started on new medication.  Encouraged her to continue working with her PCP on this and her elevated blood pressure.  Discussed trialing hydroxyzine to address her anxiety.  Discuss potential risks side effects and she was agreeable to the trial.  She reports also planning to use the bus system to explore more and plans to start being more active.  She reports wanting to work on and develop her coping skills.  Discussed scheduling with a therapist and she was agreeable with this.  Discussed she would be scheduled with one of the therapist here at the end of this appointment and she was agreeable.  She reports no SI, HI, or AVH.  She reports her sleep is good.  She reports her appetite is good.  She reports no side effects to her medications.  She reports no other concerns at present.  She will return for follow-up in approximately 2 months.   Visit Diagnosis:    ICD-10-CM   1. Generalized anxiety disorder  F41.1 QUEtiapine (SEROQUEL) 50 MG tablet    sertraline  (ZOLOFT) 100 MG tablet    hydrOXYzine (ATARAX) 10 MG tablet    2. MDD (major depressive disorder), recurrent episode, moderate (HCC)  F33.1 QUEtiapine (SEROQUEL) 50 MG tablet    sertraline (ZOLOFT) 100 MG tablet    3. PTSD (post-traumatic stress disorder)  F43.10 QUEtiapine (SEROQUEL) 50 MG tablet    sertraline (ZOLOFT) 100 MG tablet          Past Psychiatric History: Depression, Anxiety, PTSD and possible ADHD, no history of Suicide Attempts, Self Injurious Behavior, or Psychiatric Hospitalizations.    Past Medical History:  Past Medical History:  Diagnosis Date   Anemia    Asthma    due to acid reflux   Depression    DM2    Dyspnea    GERD (gastroesophageal reflux disease)    Headache    Heart murmur    benign   Hypertension    Obesity (BMI 30-39.9)    Thyroid disease     Past Surgical History:  Procedure Laterality Date   ABDOMINAL HYSTERECTOMY     BIOPSY THYROID     IR ANGIOGRAM PELVIS SELECTIVE OR SUPRASELECTIVE  05/09/2021   IR ANGIOGRAM SELECTIVE EACH ADDITIONAL VESSEL  05/09/2021   IR AORTAGRAM ABDOMINAL SERIALOGRAM  05/09/2021   IR EMBO TUMOR ORGAN ISCHEMIA INFARCT INC GUIDE ROADMAPPING  05/09/2021   IR RADIOLOGIST EVAL & MGMT  04/11/2021   IR US  GUIDE VASC ACCESS LEFT  05/09/2021   ROBOTIC ASSISTED LAPAROSCOPIC HYSTERECTOMY AND SALPINGECTOMY Bilateral 05/18/2021   Procedure: XI ROBOTIC ASSISTED LAPAROSCOPIC HYSTERECTOMY AND SALPINGECTOMY, UTERUS MORE THAN 250 GRAMS;  Surgeon: Carver Fila, MD;  Location: [facility];  Service: Gynecology;  Laterality: Bilateral;    Family Psychiatric History: Mother- Bipolar Disorder, Suicide 69 (OD) Sister- Schizophrenia, Suicide 2010 (OD) Brother- Bipolar Disorder, EtOH Abuse, Polysubstance Abuse  Family History:  Family History  Problem Relation Age of Onset   Diabetes Mother    Hypertension Mother    Diabetes Father    Hypertension Father    Endometrial cancer Maternal Aunt    Colon cancer Neg Hx     Breast cancer Neg Hx    Ovarian cancer Neg Hx    Pancreatic cancer Neg Hx    Prostate cancer Neg Hx     Social History:  Social History   Socioeconomic History   Marital status: Single    Spouse name: Not on file   Number of children: Not on file   Years of education: Not on file   Highest education level: Associate degree: occupational, Scientist, product/process development, or vocational program  Occupational History   Not on file  Tobacco Use   Smoking status: Never    Passive exposure: Never   Smokeless tobacco: Never  Vaping Use   Vaping status: Never Used  Substance and Sexual Activity   Alcohol use: Not Currently   Drug use: Never   Sexual activity: Not Currently  Other Topics Concern   Not on file  Social History Narrative   Not on file   Social Drivers of Health   Financial Resource Strain: Patient Declined (03/29/2023)   Overall Financial Resource Strain (CARDIA)    Difficulty of Paying Living Expenses: Patient declined  Recent Concern: Financial Resource Strain - Medium Risk (01/11/2023)   Overall Financial Resource Strain (CARDIA)    Difficulty of Paying Living Expenses: Somewhat hard  Food Insecurity: Patient Declined (03/29/2023)   Hunger Vital Sign    Worried About Running Out of Food in the Last Year: Patient declined    Ran Out of Food in the Last Year: Patient declined  Recent Concern: Food Insecurity - Food Insecurity Present (01/11/2023)   Hunger Vital Sign    Worried About Running Out of Food in the Last Year: Sometimes true    Ran Out of Food in the Last Year: Sometimes true  Transportation Needs: No Transportation Needs (03/29/2023)   PRAPARE - Administrator, Civil Service (Medical): No    Lack of Transportation (Non-Medical): No  Physical Activity: Insufficiently Active (03/29/2023)   Exercise Vital Sign    Days of Exercise per Week: 3 days    Minutes of Exercise per Session: 20 min  Stress: Stress Concern Present (03/29/2023)   [employer] of Occupational  Health - Occupational Stress Questionnaire    Feeling of Stress : Rather much  Social Connections: Socially Isolated (03/29/2023)   Social Connection and Isolation Panel [NHANES]    Frequency of Communication with Friends and Family: More than three times a week    Frequency of Social Gatherings with Friends and Family: Three times a week    Attends Religious Services: Never    Active Member of Clubs or Organizations: No    Attends Engineer, structural: Not on file    Marital Status: Never married    Allergies: No Known Allergies  Metabolic Disorder Labs: Lab Results  Component Value Date  HGBA1C 8.3 (A) 05/17/2023   No results found for: "PROLACTIN" Lab Results  Component Value Date   CHOL 253 (H) 05/17/2023   TRIG 225 (H) 05/17/2023   HDL 62 05/17/2023   CHOLHDL 4.1 05/17/2023   LDLCALC 150 (H) 05/17/2023   Lab Results  Component Value Date   TSH 0.28 (L) 08/21/2022   TSH 0.21 (L) 02/20/2022    Therapeutic Level Labs: No results found for: "LITHIUM" No results found for: "VALPROATE" No results found for: "CBMZ"  Current Medications: Current Outpatient Medications  Medication Sig Dispense Refill   hydrOXYzine (ATARAX) 10 MG tablet Take 1 tablet (10 mg total) by mouth 3 (three) times daily as needed for anxiety. 90 tablet 2   ACETAMINOPHEN EXTRA STRENGTH 500 MG capsule SMARTSIG:2 Capsule(s) By Mouth Daily PRN     amLODipine (NORVASC) 10 MG tablet Take 1 tablet (10 mg total) by mouth daily. 90 tablet 1   atorvastatin (LIPITOR) 40 MG tablet Take 1 tablet (40 mg total) by mouth daily. Stop simvastatin 90 tablet 1   bisoprolol (ZEBETA) 10 MG tablet Take 1 tablet (10 mg total) by mouth daily. 90 tablet 0   cyclobenzaprine (FLEXERIL) 5 MG tablet Take 1 tablet (5 mg total) by mouth 2 (two) times daily as needed for muscle spasms. 30 tablet 0   dapagliflozin propanediol (FARXIGA) 10 MG TABS tablet Take 1 tablet (10 mg total) by mouth daily before breakfast. 30 tablet 3    ferrous sulfate 325 (65 FE) MG tablet Take 325 mg by mouth in the morning.     fluticasone (FLONASE) 50 MCG/ACT nasal spray Place 2 sprays into both nostrils daily. 16 g 0   gabapentin (NEURONTIN) 300 MG capsule Take 1 capsule (300 mg total) by mouth at bedtime. 90 capsule 1   glipiZIDE (GLUCOTROL) 10 MG tablet Take 1.5 tablets (15 mg total) by mouth 2 (two) times daily before a meal. 270 tablet 2   hydrALAZINE (APRESOLINE) 50 MG tablet Take 1.5 tablets (75 mg total) by mouth 3 (three) times daily. 135 tablet 6   hydrochlorothiazide (HYDRODIURIL) 25 MG tablet Take 1 tablet (25 mg total) by mouth daily. 90 tablet 1   insulin glargine (LANTUS SOLOSTAR) 100 UNIT/ML Solostar Pen Inject 6 Units into the skin daily. 15 mL PRN   Insulin Pen Needle (PEN NEEDLES) 31G X 8 MM MISC use as directed 100 each 6   meloxicam (MOBIC) 15 MG tablet Take 1 tablet (15 mg total) by mouth daily as needed. 30 tablet 2   metFORMIN (GLUCOPHAGE-XR) 500 MG 24 hr tablet Take 2 tablets (1,000 mg total) by mouth daily with breakfast. 180 tablet 1   olmesartan (BENICAR) 40 MG tablet Take 1 tablet (40 mg total) by mouth daily. 90 tablet 3   ondansetron (ZOFRAN-ODT) 4 MG disintegrating tablet Take 1 tablet (4 mg total) by mouth every 8 (eight) hours as needed for nausea or vomiting. 20 tablet 0   QUEtiapine (SEROQUEL) 50 MG tablet Take 1 tablet (50 mg total) by mouth at bedtime. 30 tablet 2   sertraline (ZOLOFT) 100 MG tablet Take 2 tablets (200 mg total) by mouth daily. 60 tablet 2   No current facility-administered medications for this visit.     Musculoskeletal: Strength & Muscle Tone: within normal limits Gait & Station: normal Patient leans: N/A  Psychiatric Specialty Exam: Review of Systems  Respiratory:  Negative for shortness of breath.   Cardiovascular:  Negative for chest pain.  Gastrointestinal:  Negative for abdominal pain, constipation,  diarrhea, nausea and vomiting.  Neurological:  Negative for dizziness,  weakness and headaches.  Psychiatric/Behavioral:  Negative for dysphoric mood, hallucinations, sleep disturbance and suicidal ideas. The patient is nervous/anxious.     Blood pressure (!) 146/83, pulse 82, height 5\' 6"  (1.676 m), weight 169 lb 9.6 oz (76.9 kg), last menstrual period 04/27/2021, SpO2 100%.Body mass index is 27.37 kg/m.  General Appearance: Casual and Fairly Groomed  Eye Contact:  Good  Speech:  Clear and Coherent and Normal Rate  Volume:  Normal  Mood:   "ok"  Affect:  Appropriate and Congruent  Thought Process:  Coherent and Goal Directed  Orientation:  Full (Time, Place, and Person)  Thought Content: WDL and Logical   Suicidal Thoughts:  No  Homicidal Thoughts:  No  Memory:  Immediate;   Good Recent;   Good  Judgement:  Good  Insight:  Good  Psychomotor Activity:  Normal  Concentration:  Concentration: Good and Attention Span: Good  Recall:  Good  Fund of Knowledge: Good  Language: Good  Akathisia:  Negative  Handed:  Right  AIMS (if indicated): Not done  Assets:  Communication Skills Desire for Improvement Resilience Vocational/Educational  ADL's:  Intact  Cognition: WNL  Sleep:  Good   Screenings: AIMS    Flowsheet Row Clinical Support from 03/09/2023 in [facility] Clinical Support from 07/07/2022 in [facility]  AIMS Total Score 0 0      GAD-7    Flowsheet Row Office Visit from 08/04/2022 in [facility] [place] [facility] Dept Of [place] from 07/06/2021 in [facility] Office Visit [facility]  Total GAD-7 Score 0 14 15      PHQ2-9    Flowsheet Row Office Visit from 08/04/2022 in [facility] [place] - A Dept Of [place]. [facility] Office Visit from 06/29/2021 in [facility] at [facility] Office Visit from 02/17/2021  in [facility] at [facility] Office Visit from 11/11/2020 in [facility]  PHQ-2 Total Score 1 2 2 2   PHQ-9 Total Score 1 4 11 10       Flowsheet Row ED from 04/18/2023 in [facility] at [facility]) ED from 03/16/2023 in [facility] at [facility] from 07/06/2021 in [facility]  C-SSRS RISK CATEGORY No Risk No Risk No Risk        Assessment and Plan:  Briselda Naval is a 53 yr old female who presents for Follow Up and Medication Management.  PPHx is significant for Depression, Anxiety, PTSD and possible ADHD, no history of Suicide Attempts, Self Injurious Behavior, or Psychiatric Hospitalizations.     Karem has continued to have stress due to work but is handling it fairly well.  She would benefit from therapy so she will have an appointment made with a therapist.  To address her increased anxiety we will trial hydroxyzine.  We will not make any other changes to her medication at this time.  Refills were sent in.  She will return follow-up approximately 2 months.  She is having elevated blood pressure and encouraged her to discuss this further with her PCP.  MDD, Recurrent, Moderate  GAD  PTSD: -Continue Zoloft  200 mg daily for depression and anxiety.  60 (100 mg) tablets with 2 refills. -Start Hydroxyzine 10 mg TID for anxiety.  90 tablets with 2 refills. -Continue Seroquel 50 mg QHS for augmentation, mood stability, and insomnia.  30 tablets with 2 refills.    Collaboration of Care:   Patient/Guardian was advised Release of Information must be obtained prior to any record release in order to collaborate their care with an outside provider. Patient/Guardian was advised if they have not already done so to contact the registration department to sign all necessary forms in order for [place] to release information regarding their care.    Consent: Patient/Guardian gives verbal consent for treatment and assignment of benefits for services provided during this visit. Patient/Guardian expressed understanding and agreed to proceed.    Lauro Franklin, MD 06/15/2023, 9:33 AM

## 2023-06-15 NOTE — Addendum Note (Signed)
 Addended by: Lauro Franklin on: 06/15/2023 10:55 AM   Modules accepted: Level of Service

## 2023-06-26 ENCOUNTER — Ambulatory Visit: Payer: Self-pay | Admitting: Podiatry

## 2023-07-02 ENCOUNTER — Other Ambulatory Visit: Payer: Self-pay | Admitting: Physician Assistant

## 2023-07-02 DIAGNOSIS — I152 Hypertension secondary to endocrine disorders: Secondary | ICD-10-CM

## 2023-07-03 ENCOUNTER — Encounter: Payer: Self-pay | Admitting: Hematology and Oncology

## 2023-07-03 ENCOUNTER — Other Ambulatory Visit: Payer: Self-pay

## 2023-07-03 MED ORDER — BISOPROLOL FUMARATE 10 MG PO TABS
10.0000 mg | ORAL_TABLET | Freq: Every day | ORAL | 0 refills | Status: DC
  Filled 2023-07-03: qty 90, 90d supply, fill #0

## 2023-07-03 NOTE — Telephone Encounter (Signed)
 Requested Prescriptions  Pending Prescriptions Disp Refills   bisoprolol (ZEBETA) 10 MG tablet 90 tablet 0    Sig: Take 1 tablet (10 mg total) by mouth daily.     Cardiovascular: Beta Blockers 2 Failed - 07/03/2023  4:06 PM      Failed - Last BP in normal range    BP Readings from Last 1 Encounters:  05/17/23 (!) 163/90         Passed - Cr in normal range and within 360 days    Creatinine  Date Value Ref Range Status  10/27/2021 0.72 0.44 - 1.00 mg/dL Final   Creatinine, Ser  Date Value Ref Range Status  05/17/2023 0.63 0.57 - 1.00 mg/dL Final         Passed - Last Heart Rate in normal range    Pulse Readings from Last 1 Encounters:  05/17/23 76         Passed - Valid encounter within last 6 months    Recent Outpatient Visits           1 month ago Diabetes mellitus treated with oral medication (HCC)   [place] [facility] Of [place]. [facility] Concetta Dee B, MD   3 months ago Type 2 diabetes mellitus with other specified complication, without long-term current use of insulin (HCC)   [place] [facility] - [facility] Dept Of [place]. [facility], Shelvy Dickens M, [place]   5 months ago Type 2 diabetes mellitus with other specified complication, without long-term current use of insulin (HCC)   [place] [facility] - [facility] Dept Of [place]. [facility] Concetta Dee B, MD   11 months ago Uncontrolled type 2 diabetes mellitus with hyperglycemia [facility])   [place] [facility] - A Dept Of [place]. [facility] Lawrance Presume, MD   1 year ago Uncontrolled type 2 diabetes mellitus with hyperglycemia [facility])   [place] [facility] - A Dept Of [place]. [facility] Lawrance Presume, MD

## 2023-07-04 ENCOUNTER — Other Ambulatory Visit: Payer: Self-pay

## 2023-08-02 ENCOUNTER — Other Ambulatory Visit: Payer: Self-pay

## 2023-08-02 ENCOUNTER — Ambulatory Visit (INDEPENDENT_AMBULATORY_CARE_PROVIDER_SITE_OTHER): Admitting: Licensed Clinical Social Worker

## 2023-08-02 ENCOUNTER — Encounter: Payer: Self-pay | Admitting: Hematology and Oncology

## 2023-08-02 DIAGNOSIS — F331 Major depressive disorder, recurrent, moderate: Secondary | ICD-10-CM

## 2023-08-02 DIAGNOSIS — F411 Generalized anxiety disorder: Secondary | ICD-10-CM

## 2023-08-02 DIAGNOSIS — R632 Polyphagia: Secondary | ICD-10-CM | POA: Insufficient documentation
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
## 2023-08-03 ENCOUNTER — Other Ambulatory Visit: Payer: Self-pay

## 2023-08-10 ENCOUNTER — Encounter (HOSPITAL_COMMUNITY): Admitting: Student in an Organized Health Care Education/Training Program

## 2023-08-10 NOTE — Progress Notes (Deleted)
 [facility] MD/PA/NP OP Progress Note  08/10/2023 7:08 AM Crystal [facility]  MRN:  [id number]  Chief Complaint:  No chief complaint on file.  HPI:  Crystal [facility]quez is a 53 yr old female who presents for Follow Up and Medication Management.  PPHx is significant for Depression, Anxiety, PTSD and possible ADHD, no history of Suicide Attempts, Self Injurious Behavior, or Psychiatric Hospitalizations.    She reports ***   Visit Diagnosis:  No diagnosis found.       Past Psychiatric History: Depression, Anxiety, PTSD and possible ADHD, no history of Suicide Attempts, Self Injurious Behavior, or Psychiatric Hospitalizations.    Past Medical History:  Past Medical History:  Diagnosis Date   Anemia    Asthma    due to acid reflux   Depression    DM2    Dyspnea    GERD (gastroesophageal reflux disease)    Headache    Heart murmur    benign   Hypertension    Obesity (BMI 30-39.9)    Thyroid  disease     Past Surgical History:  Procedure Laterality Date   ABDOMINAL HYSTERECTOMY     BIOPSY THYROID      IR ANGIOGRAM PELVIS SELECTIVE OR SUPRASELECTIVE  05/09/2021   IR ANGIOGRAM SELECTIVE EACH ADDITIONAL VESSEL  05/09/2021   IR AORTAGRAM ABDOMINAL SERIALOGRAM  05/09/2021   IR EMBO TUMOR ORGAN ISCHEMIA INFARCT INC GUIDE ROADMAPPING  05/09/2021   IR RADIOLOGIST EVAL & MGMT  04/11/2021   IR US  GUIDE VASC ACCESS LEFT  05/09/2021   ROBOTIC ASSISTED LAPAROSCOPIC HYSTERECTOMY AND SALPINGECTOMY Bilateral 05/18/2021   Procedure: XI ROBOTIC ASSISTED LAPAROSCOPIC HYSTERECTOMY AND SALPINGECTOMY, UTERUS MORE THAN 250 GRAMS;  Surgeon: Suzi Essex, MD;  Location: [facility];  Service: Gynecology;  Laterality: Bilateral;    Family Psychiatric History: Mother- Bipolar Disorder, Suicide 67 (OD) Sister- Schizophrenia, Suicide 2010 (OD) Brother- Bipolar Disorder, EtOH Abuse, Polysubstance Abuse  Family History:  Family History  Problem Relation Age of Onset   Diabetes Mother     Hypertension Mother    Diabetes Father    Hypertension Father    Endometrial cancer Maternal Aunt    Colon cancer Neg Hx    Breast cancer Neg Hx    Ovarian cancer Neg Hx    Pancreatic cancer Neg Hx    Prostate cancer Neg Hx     Social History:  Social History   Socioeconomic History   Marital status: Single    Spouse name: Not on file   Number of children: Not on file   Years of education: Not on file   Highest education level: Associate degree: occupational, Scientist, product/process development, or vocational program  Occupational History   Not on file  Tobacco Use   Smoking status: Never    Passive exposure: Never   Smokeless tobacco: Never  Vaping Use   Vaping status: Never Used  Substance and Sexual Activity   Alcohol use: Not Currently   Drug use: Never   Sexual activity: Not Currently  Other Topics Concern   Not on file  Social History Narrative   Not on file   Social Drivers of Health   Financial Resource Strain: High Risk (08/02/2023)   Overall Financial Resource Strain (CARDIA)    Difficulty of Paying Living Expenses: Hard  Food Insecurity: Food Insecurity Present (08/02/2023)   Hunger Vital Sign    Worried About Running Out of Food in the Last Year: Sometimes true    Ran Out of Food in the Last Year: Never true  Transportation Needs: No Transportation Needs (03/29/2023)   PRAPARE - Administrator, Civil Service (Medical): No    Lack of Transportation (Non-Medical): No  Physical Activity: Inactive (08/02/2023)   Exercise Vital Sign    Days of Exercise per Week: 0 days    Minutes of Exercise per Session: 0 min  Stress: Stress Concern Present (08/02/2023)   [employer] of Occupational Health - Occupational Stress Questionnaire    Feeling of Stress : To some extent  Social Connections: Socially Isolated (03/29/2023)   Social Connection and Isolation Panel [NHANES]    Frequency of Communication with Friends and Family: More than three times a week    Frequency of Social  Gatherings with Friends and Family: Three times a week    Attends Religious Services: Never    Active Member of Clubs or Organizations: No    Attends Engineer, structural: Not on file    Marital Status: Never married    Allergies: No Known Allergies  Metabolic Disorder Labs: Lab Results  Component Value Date   HGBA1C 8.3 (A) 05/17/2023   No results found for: "PROLACTIN" Lab Results  Component Value Date   CHOL 253 (H) 05/17/2023   TRIG 225 (H) 05/17/2023   HDL 62 05/17/2023   CHOLHDL 4.1 05/17/2023   LDLCALC 150 (H) 05/17/2023   Lab Results  Component Value Date   TSH 0.28 (L) 08/21/2022   TSH 0.21 (L) 02/20/2022    Therapeutic Level Labs: No results found for: "LITHIUM" No results found for: "VALPROATE" No results found for: "CBMZ"  Current Medications: Current Outpatient Medications  Medication Sig Dispense Refill   ACETAMINOPHEN  EXTRA STRENGTH 500 MG capsule SMARTSIG:2 Capsule(s) By Mouth Daily PRN     amLODipine  (NORVASC ) 10 MG tablet Take 1 tablet (10 mg total) by mouth daily. 90 tablet 1   atorvastatin  (LIPITOR) 40 MG tablet Take 1 tablet (40 mg total) by mouth daily. Stop simvastatin  90 tablet 1   bisoprolol  (ZEBETA ) 10 MG tablet Take 1 tablet (10 mg total) by mouth daily. 90 tablet 0   cyclobenzaprine  (FLEXERIL ) 5 MG tablet Take 1 tablet (5 mg total) by mouth 2 (two) times daily as needed for muscle spasms. 30 tablet 0   dapagliflozin  propanediol (FARXIGA ) 10 MG TABS tablet Take 1 tablet (10 mg total) by mouth daily before breakfast. 30 tablet 3   ferrous sulfate  325 (65 FE) MG tablet Take 325 mg by mouth in the morning.     fluticasone  (FLONASE ) 50 MCG/ACT nasal spray Place 2 sprays into both nostrils daily. 16 g 0   gabapentin  (NEURONTIN ) 300 MG capsule Take 1 capsule (300 mg total) by mouth at bedtime. 90 capsule 1   glipiZIDE  (GLUCOTROL ) 10 MG tablet Take 1.5 tablets (15 mg total) by mouth 2 (two) times daily before a meal. 270 tablet 2    hydrALAZINE  (APRESOLINE ) 50 MG tablet Take 1.5 tablets (75 mg total) by mouth 3 (three) times daily. 135 tablet 6   hydrochlorothiazide  (HYDRODIURIL ) 25 MG tablet Take 1 tablet (25 mg total) by mouth daily. 90 tablet 1   hydrOXYzine  (ATARAX ) 10 MG tablet Take 1 tablet (10 mg total) by mouth 3 (three) times daily as needed for anxiety. 90 tablet 2   insulin  glargine (LANTUS  SOLOSTAR) 100 UNIT/ML Solostar Pen Inject 6 Units into the skin daily. 15 mL PRN   Insulin  Pen Needle (PEN NEEDLES) 31G X 8 MM MISC use as directed 100 each 6   meloxicam  (MOBIC ) 15 MG  tablet Take 1 tablet (15 mg total) by mouth daily as needed. 30 tablet 2   metFORMIN  (GLUCOPHAGE -XR) 500 MG 24 hr tablet Take 2 tablets (1,000 mg total) by mouth daily with breakfast. 180 tablet 1   olmesartan  (BENICAR ) 40 MG tablet Take 1 tablet (40 mg total) by mouth daily. 90 tablet 3   ondansetron  (ZOFRAN -ODT) 4 MG disintegrating tablet Take 1 tablet (4 mg total) by mouth every 8 (eight) hours as needed for nausea or vomiting. 20 tablet 0   QUEtiapine  (SEROQUEL ) 50 MG tablet Take 1 tablet (50 mg total) by mouth at bedtime. 30 tablet 2   sertraline  (ZOLOFT ) 100 MG tablet Take 2 tablets (200 mg total) by mouth daily. 60 tablet 2   No current facility-administered medications for this visit.     Musculoskeletal: Strength & Muscle Tone: within normal limits Gait & Station: normal Patient leans: N/A *** Psychiatric Specialty Exam: Review of Systems  Last menstrual period 04/27/2021.There is no height or weight on file to calculate BMI.  General Appearance: Casual and Fairly Groomed  Eye Contact:  Good  Speech:  Clear and Coherent and Normal Rate  Volume:  Normal  Mood:  "ok"  Affect:  Appropriate and Congruent  Thought Process:  Coherent and Goal Directed  Orientation:  Full (Time, Place, and Person)  Thought Content: WDL and Logical   Suicidal Thoughts:  No  Homicidal Thoughts:  No  Memory:  Immediate;   Good Recent;   Good   Judgement:  Good  Insight:  Good  Psychomotor Activity:  Normal  Concentration:  Concentration: Good and Attention Span: Good  Recall:  Good  Fund of Knowledge: Good  Language: Good  Akathisia:  Negative  Handed:  Right  AIMS (if indicated): Not done  Assets:  Communication Skills Desire for Improvement Resilience Vocational/Educational  ADL's:  Intact  Cognition: WNL  Sleep:  Good   Screenings: AIMS    Flowsheet Row Clinical Support from 03/09/2023 in [facility] Clinical Support from 07/07/2022 in [facility]  AIMS Total Score 0 0      GAD-7    Flowsheet Row Office Visit from 08/04/2022 in [facility] [place] [facility] [place]. [facility] Counselor from 07/06/2021 in [facility] Office [facility]  Total GAD-7 Score 0 14 15      PHQ2-9    Flowsheet Row Counselor from 08/02/2023 in [facility] Office Visit from 08/04/2022 in [facility] [place] [facility] [place]. [facility] Office Visit from 06/29/2021 in [facility] at [facility] Office Visit from 02/17/2021 in [facility] Care at [facility] Office Visit from 11/11/2020 in [facility]  PHQ-2 Total Score 2 1 2 2 2   PHQ-9 Total Score 9 1 4 11 10       Flowsheet Row Counselor from 08/02/2023 in [facility] UC from 04/18/2023 in [facility] at [facility] UC from 03/16/2023 in [facility] at [facility] RISK CATEGORY No Risk No Risk No Risk        Assessment and Plan:  Felise Georgia is a 53 yr old female who presents for Follow Up and Medication Management.  PPHx is significant for Depression, Anxiety, PTSD and  possible ADHD,  no history of Suicide Attempts, Self Injurious Behavior, or Psychiatric Hospitalizations.     [place] ***   MDD, Recurrent, Moderate  GAD  PTSD: -Continue Zoloft   200 mg daily for depression and anxiety.  60 (100 mg) tablets with 2 refills. -Start Hydroxyzine  10 mg TID for anxiety.  90 tablets with 2 refills. -Continue Seroquel  50 mg QHS for augmentation, mood stability, and insomnia.  30 tablets with 2 refills.    Collaboration of Care:   Patient/Guardian was advised Release of Information must be obtained prior to any record release in order to collaborate their care with an outside provider. Patient/Guardian was advised if they have not already done so to contact the registration department to sign all necessary forms in order for us  to release information regarding their care.   Consent: Patient/Guardian gives verbal consent for treatment and assignment of benefits for services provided during this visit. Patient/Guardian expressed understanding and agreed to proceed.    Basilia Bosworth, DO 08/10/2023, 7:08 AM

## 2023-08-13 ENCOUNTER — Telehealth: Payer: Self-pay | Admitting: Family Medicine

## 2023-08-13 DIAGNOSIS — J019 Acute sinusitis, unspecified: Secondary | ICD-10-CM

## 2023-08-13 MED ORDER — GUAIFENESIN 200 MG PO TABS: 400.0000 mg | ORAL_TABLET | ORAL | 0 refills | Status: AC | PRN

## 2023-08-13 NOTE — Patient Instructions (Signed)
 Crystal Vasquez, thank you for joining Crystal Roys, Crystal Vasquez for today's virtual visit.  While this provider is not your primary care provider (PCP), if your PCP is located in our provider database this encounter information will be shared with them immediately following your visit.   A [facility] MyChart account gives you access to today's visit and all your visits, tests, and labs performed at [facility] " click here if you don't have a [facility] MyChart account or go to [email or web address]  Consent: (Patient) Crystal Vasquez provided verbal consent for this virtual visit at the beginning of the encounter.  Current Medications:  Current Outpatient Medications:    guaiFENesin 200 MG tablet, Take 2 tablets (400 mg total) by mouth every 4 (four) hours as needed for up to 7 days for cough or to loosen phlegm., Disp: 30 tablet, Rfl: 0   ACETAMINOPHEN  EXTRA STRENGTH 500 MG capsule, SMARTSIG:2 Capsule(s) By Mouth Daily PRN, Disp: , Rfl:    amLODipine  (NORVASC ) 10 MG tablet, Take 1 tablet (10 mg total) by mouth daily., Disp: 90 tablet, Rfl: 1   atorvastatin  (LIPITOR) 40 MG tablet, Take 1 tablet (40 mg total) by mouth daily. Stop simvastatin , Disp: 90 tablet, Rfl: 1   bisoprolol  (ZEBETA ) 10 MG tablet, Take 1 tablet (10 mg total) by mouth daily., Disp: 90 tablet, Rfl: 0   cyclobenzaprine  (FLEXERIL ) 5 MG tablet, Take 1 tablet (5 mg total) by mouth 2 (two) times daily as needed for muscle spasms., Disp: 30 tablet, Rfl: 0   dapagliflozin  propanediol (FARXIGA ) 10 MG TABS tablet, Take 1 tablet (10 mg total) by mouth daily before breakfast., Disp: 30 tablet, Rfl: 3   ferrous sulfate  325 (65 FE) MG tablet, Take 325 mg by mouth in the morning., Disp: , Rfl:    fluticasone  (FLONASE ) 50 MCG/ACT nasal spray, Place 2 sprays into both nostrils daily., Disp: 16 g, Rfl: 0   gabapentin  (NEURONTIN ) 300 MG capsule, Take 1 capsule (300 mg total) by mouth at bedtime., Disp: 90 capsule, Rfl: 1    glipiZIDE  (GLUCOTROL ) 10 MG tablet, Take 1.5 tablets (15 mg total) by mouth 2 (two) times daily before a meal., Disp: 270 tablet, Rfl: 2   hydrALAZINE  (APRESOLINE ) 50 MG tablet, Take 1.5 tablets (75 mg total) by mouth 3 (three) times daily., Disp: 135 tablet, Rfl: 6   hydrochlorothiazide  (HYDRODIURIL ) 25 MG tablet, Take 1 tablet (25 mg total) by mouth daily., Disp: 90 tablet, Rfl: 1   hydrOXYzine  (ATARAX ) 10 MG tablet, Take 1 tablet (10 mg total) by mouth 3 (three) times daily as needed for anxiety., Disp: 90 tablet, Rfl: 2   insulin  glargine (LANTUS  SOLOSTAR) 100 UNIT/ML Solostar Pen, Inject 6 Units into the skin daily., Disp: 15 mL, Rfl: PRN   Insulin  Pen Needle (PEN NEEDLES) 31G X 8 MM MISC, use as directed, Disp: 100 each, Rfl: 6   meloxicam  (MOBIC ) 15 MG tablet, Take 1 tablet (15 mg total) by mouth daily as needed., Disp: 30 tablet, Rfl: 2   metFORMIN  (GLUCOPHAGE -XR) 500 MG 24 hr tablet, Take 2 tablets (1,000 mg total) by mouth daily with breakfast., Disp: 180 tablet, Rfl: 1   olmesartan  (BENICAR ) 40 MG tablet, Take 1 tablet (40 mg total) by mouth daily., Disp: 90 tablet, Rfl: 3   ondansetron  (ZOFRAN -ODT) 4 MG disintegrating tablet, Take 1 tablet (4 mg total) by mouth every 8 (eight) hours as needed for nausea or vomiting., Disp: 20 tablet, Rfl: 0   QUEtiapine  (SEROQUEL ) 50 MG tablet, Take  1 tablet (50 mg total) by mouth at bedtime., Disp: 30 tablet, Rfl: 2   sertraline  (ZOLOFT ) 100 MG tablet, Take 2 tablets (200 mg total) by mouth daily., Disp: 60 tablet, Rfl: 2   Medications ordered in this encounter:  Meds ordered this encounter  Medications   guaiFENesin  200 MG tablet    Sig: Take 2 tablets (400 mg total) by mouth every 4 (four) hours as needed for up to 7 days for cough or to loosen phlegm.    Dispense:  30 tablet    Refill:  0     *If you need refills on other medications prior to your next appointment, please contact your pharmacy*  Follow-Up: Call back or seek an in-person  evaluation if the symptoms worsen or if the condition fails to improve as anticipated.  [facility] [phone]  Other Instructions Sinus Infection, Adult A sinus infection, also called sinusitis, is inflammation of your sinuses. Sinuses are hollow spaces in the bones around your face. Your sinuses are located: Around your eyes. In the middle of your forehead. Behind your nose. In your cheekbones. Mucus normally drains out of your sinuses. When your nasal tissues become inflamed or swollen, mucus can become trapped or blocked. This allows bacteria, viruses, and fungi to grow, which leads to infection. Most infections of the sinuses are caused by a virus. A sinus infection can develop quickly. It can last for up to 4 weeks (acute) or for more than 12 weeks (chronic). A sinus infection often develops after a cold. What are the causes? This condition is caused by anything that creates swelling in the sinuses or stops mucus from draining. This includes: Allergies. Asthma. Infection from bacteria or viruses. Deformities or blockages in your nose or sinuses. Abnormal growths in the nose (nasal polyps). Pollutants, such as chemicals or irritants in the air. Infection from fungi. This is rare. What increases the risk? You are more likely to develop this condition if you: Have a weak body defense system (immune system). Do a lot of swimming or diving. Overuse nasal sprays. Smoke. What are the signs or symptoms? The main symptoms of this condition are pain and a feeling of pressure around the affected sinuses. Other symptoms include: Stuffy nose or congestion that makes it difficult to breathe through your nose. Thick yellow or greenish drainage from your nose. Tenderness, swelling, and warmth over the affected sinuses. A cough that may get worse at night. Decreased sense of smell and taste. Extra mucus that collects in the throat or the back of the nose (postnasal drip)  causing a sore throat or bad breath. Tiredness (fatigue). Fever. How is this diagnosed? This condition is diagnosed based on: Your symptoms. Your medical history. A physical exam. Tests to find out if your condition is acute or chronic. This may include: Checking your nose for nasal polyps. Viewing your sinuses using a device that has a light (endoscope). Testing for allergies or bacteria. Imaging tests, such as an MRI or CT scan. In rare cases, a bone biopsy may be done to rule out more serious types of fungal sinus disease. How is this treated? Treatment for a sinus infection depends on the cause and whether your condition is chronic or acute. If caused by a virus, your symptoms should go away on their own within 10 days. You may be given medicines to relieve symptoms. They include: Medicines that shrink swollen nasal passages (decongestants). A spray that eases inflammation of the nostrils (topical intranasal  corticosteroids). Rinses that help get rid of thick mucus in your nose (nasal saline washes). Medicines that treat allergies (antihistamines). Over-the-counter pain relievers. If caused by bacteria, your health care provider may recommend waiting to see if your symptoms improve. Most bacterial infections will get better without antibiotic medicine. You may be given antibiotics if you have: A severe infection. A weak immune system. If caused by narrow nasal passages or nasal polyps, surgery may be needed. Follow these instructions at home: Medicines Take, use, or apply over-the-counter and prescription medicines only as told by your health care provider. These may include nasal sprays. If you were prescribed an antibiotic medicine, take it as told by your health care provider. Do not stop taking the antibiotic even if you start to feel better. Hydrate and humidify  Drink enough fluid to keep your urine pale yellow. Staying hydrated will help to thin your mucus. Use a cool mist  humidifier to keep the humidity level in your home above 50%. Inhale steam for 10-15 minutes, 3-4 times a day, or as told by your health care provider. You can do this in the bathroom while a hot shower is running. Limit your exposure to cool or dry air. Rest Rest as much as possible. Sleep with your head raised (elevated). Make sure you get enough sleep each night. General instructions  Apply a warm, moist washcloth to your face 3-4 times a day or as told by your health care provider. This will help with discomfort. Use nasal saline washes as often as told by your health care provider. Wash your hands often with soap and water  to reduce your exposure to germs. If soap and water  are not available, use hand sanitizer. Do not smoke. Avoid being around people who are smoking (secondhand smoke). Keep all follow-up visits. This is important. Contact a health care provider if: You have a fever. Your symptoms get worse. Your symptoms do not improve within 10 days. Get help right away if: You have a severe headache. You have persistent vomiting. You have severe pain or swelling around your face or eyes. You have vision problems. You develop confusion. Your neck is stiff. You have trouble breathing. These symptoms may be an emergency. Get help right away. Call 911. Do not wait to see if the symptoms will go away. Do not drive yourself to the hospital. Summary A sinus infection is soreness and inflammation of your sinuses. Sinuses are hollow spaces in the bones around your face. This condition is caused by nasal tissues that become inflamed or swollen. The swelling traps or blocks the flow of mucus. This allows bacteria, viruses, and fungi to grow, which leads to infection. If you were prescribed an antibiotic medicine, take it as told by your health care provider. Do not stop taking the antibiotic even if you start to feel better. Keep all follow-up visits. This is important. This  information is not intended to replace advice given to you by your health care provider. Make sure you discuss any questions you have with your health care provider. Document Revised: 02/08/2021 Document Reviewed: 02/08/2021 Elsevier Patient Education  2024 Elsevier Inc.   If you have been instructed to have an in-person evaluation today at a local Urgent Care facility, please use the link below. It will take you to a list of all of our available [facility], including address, phone number and hours of operation. Please do not delay care.  [facility]  If you or a  family member do not have a primary care provider, use the link below to schedule a visit and establish care. When you choose a [facility] primary care physician or advanced practice provider, you gain a long-term partner in health. Find a Primary Care Provider  Learn more about [facility]'s in-office and virtual care options: [facility] - Get Care Now

## 2023-08-13 NOTE — Progress Notes (Signed)
 Virtual Visit Consent   Crystal Vasquez, you are scheduled for a virtual visit with a [facility] provider today. Just as with appointments in the office, your consent must be obtained to participate. Your consent will be active for this visit and any virtual visit you may have with one of our providers in the next 365 days. If you have a MyChart account, a copy of this consent can be sent to you electronically.  As this is a virtual visit, video technology does not allow for your provider to perform a traditional examination. This may limit your provider's ability to fully assess your condition. If your provider identifies any concerns that need to be evaluated in person or the need to arrange testing (such as labs, EKG, etc.), we will make arrangements to do so. Although advances in technology are sophisticated, we cannot ensure that it will always work on either your end or our end. If the connection with a video visit is poor, the visit may have to be switched to a telephone visit. With either a video or telephone visit, we are not always able to ensure that we have a secure connection.  By engaging in this virtual visit, you consent to the provision of healthcare and authorize for your insurance to be billed (if applicable) for the services provided during this visit. Depending on your insurance coverage, you may receive a charge related to this service.  I need to obtain your verbal consent now. Are you willing to proceed with your visit today? Crystal Vasquez has provided verbal consent on 08/13/2023 for a virtual visit (video or telephone). Crystal Vasquez, [place]  Date: 08/13/2023 9:13 AM   Virtual Visit via Video Note   I, Crystal Vasquez, connected with  Crystal Vasquez  ([id number], March 17, 1971) on 08/13/23 at  9:00 AM EDT by a video-enabled telemedicine application and verified that I am speaking with the correct person using two identifiers.  Location: Patient: Virtual Visit  Location Patient: Parked car Provider: Virtual Visit Location Provider: Home Office   I discussed the limitations of evaluation and management by telemedicine and the availability of in person appointments. The patient expressed understanding and agreed to proceed.    History of Present Illness: Crystal Vasquez is a 53 y.o. who identifies as a female who was assigned female at birth, and is being seen today for c/o she thinks she has a sinus infection.  Pt states symptoms started about three days ago.  Pt states she has dry nose, pain in cheek and left eye kind of hurts.  Pt states she has stuffy ears and ear pain.  Pt states taking Tylenol  for symptoms and sinus flushes. Pt states she has nasal congestion as well.  Pt states she has flonase  but did not think to use it.   HPI: HPI  Problems:  Patient Active Problem List   Diagnosis Date Noted   GAD (generalized anxiety disorder) 08/02/2023   MDD (major depressive disorder), recurrent episode, moderate (HCC) 08/02/2023   Binge eating 08/02/2023   Subclinical hyperthyroidism 02/20/2022   Hyperlipidemia associated with type 2 diabetes mellitus (HCC) 01/24/2022   Excessive daytime sleepiness 10/31/2021   Snoring 10/31/2021   Fever postop 05/19/2021   Uterine polyp 05/09/2021   Hypertension associated with type 2 diabetes mellitus (HCC) 12/03/2020   Uncontrolled type 2 diabetes mellitus with hyperglycemia (HCC) 12/03/2020   Uterine mass 12/03/2020   Abnormal uterine bleeding (AUB) 11/11/2020   History of uterine fibroid 11/11/2020  Iron deficiency anemia due to chronic blood loss 11/11/2020   History of menorrhagia 11/11/2020    Allergies: No Known Allergies Medications:  Current Outpatient Medications:    guaiFENesin 200 MG tablet, Take 2 tablets (400 mg total) by mouth every 4 (four) hours as needed for up to 7 days for cough or to loosen phlegm., Disp: 30 tablet, Rfl: 0   ACETAMINOPHEN  EXTRA STRENGTH 500 MG capsule, SMARTSIG:2  Capsule(s) By Mouth Daily PRN, Disp: , Rfl:    amLODipine  (NORVASC ) 10 MG tablet, Take 1 tablet (10 mg total) by mouth daily., Disp: 90 tablet, Rfl: 1   atorvastatin  (LIPITOR) 40 MG tablet, Take 1 tablet (40 mg total) by mouth daily. Stop simvastatin , Disp: 90 tablet, Rfl: 1   bisoprolol  (ZEBETA ) 10 MG tablet, Take 1 tablet (10 mg total) by mouth daily., Disp: 90 tablet, Rfl: 0   cyclobenzaprine  (FLEXERIL ) 5 MG tablet, Take 1 tablet (5 mg total) by mouth 2 (two) times daily as needed for muscle spasms., Disp: 30 tablet, Rfl: 0   dapagliflozin  propanediol (FARXIGA ) 10 MG TABS tablet, Take 1 tablet (10 mg total) by mouth daily before breakfast., Disp: 30 tablet, Rfl: 3   ferrous sulfate  325 (65 FE) MG tablet, Take 325 mg by mouth in the morning., Disp: , Rfl:    fluticasone  (FLONASE ) 50 MCG/ACT nasal spray, Place 2 sprays into both nostrils daily., Disp: 16 g, Rfl: 0   gabapentin  (NEURONTIN ) 300 MG capsule, Take 1 capsule (300 mg total) by mouth at bedtime., Disp: 90 capsule, Rfl: 1   glipiZIDE  (GLUCOTROL ) 10 MG tablet, Take 1.5 tablets (15 mg total) by mouth 2 (two) times daily before a meal., Disp: 270 tablet, Rfl: 2   hydrALAZINE  (APRESOLINE ) 50 MG tablet, Take 1.5 tablets (75 mg total) by mouth 3 (three) times daily., Disp: 135 tablet, Rfl: 6   hydrochlorothiazide  (HYDRODIURIL ) 25 MG tablet, Take 1 tablet (25 mg total) by mouth daily., Disp: 90 tablet, Rfl: 1   hydrOXYzine  (ATARAX ) 10 MG tablet, Take 1 tablet (10 mg total) by mouth 3 (three) times daily as needed for anxiety., Disp: 90 tablet, Rfl: 2   insulin  glargine (LANTUS  SOLOSTAR) 100 UNIT/ML Solostar Pen, Inject 6 Units into the skin daily., Disp: 15 mL, Rfl: PRN   Insulin  Pen Needle (PEN NEEDLES) 31G X 8 MM MISC, use as directed, Disp: 100 each, Rfl: 6   meloxicam  (MOBIC ) 15 MG tablet, Take 1 tablet (15 mg total) by mouth daily as needed., Disp: 30 tablet, Rfl: 2   metFORMIN  (GLUCOPHAGE -XR) 500 MG 24 hr tablet, Take 2 tablets (1,000 mg  total) by mouth daily with breakfast., Disp: 180 tablet, Rfl: 1   olmesartan  (BENICAR ) 40 MG tablet, Take 1 tablet (40 mg total) by mouth daily., Disp: 90 tablet, Rfl: 3   ondansetron  (ZOFRAN -ODT) 4 MG disintegrating tablet, Take 1 tablet (4 mg total) by mouth every 8 (eight) hours as needed for nausea or vomiting., Disp: 20 tablet, Rfl: 0   QUEtiapine  (SEROQUEL ) 50 MG tablet, Take 1 tablet (50 mg total) by mouth at bedtime., Disp: 30 tablet, Rfl: 2   sertraline  (ZOLOFT ) 100 MG tablet, Take 2 tablets (200 mg total) by mouth daily., Disp: 60 tablet, Rfl: 2  Observations/Objective: Patient is well-developed, well-nourished in no acute distress.  Resting comfortably in car Head is normocephalic, atraumatic.  No labored breathing.  Speech is clear and coherent with logical content.  Patient is alert and oriented at baseline.    Assessment and Plan: 1. Acute sinusitis,  recurrence not specified, unspecified location (Primary) - guaiFENesin 200 MG tablet; Take 2 tablets (400 mg total) by mouth every 4 (four) hours as needed for up to 7 days for cough or to loosen phlegm.  Dispense: 30 tablet; Refill: 0  -Etiology likely is viral given recent duration  -Advised Pt to start Flonase  she has at home and Guaifenesin being sent to pharmacy -Advised Pt if worsening symptoms to follow up with PCP or urgent care   Follow Up Instructions: I discussed the assessment and treatment plan with the patient. The patient was provided an opportunity to ask questions and all were answered. The patient agreed with the plan and demonstrated an understanding of the instructions.  A copy of instructions were sent to the patient via MyChart unless otherwise noted below.    The patient was advised to call back or seek an in-person evaluation if the symptoms worsen or if the condition fails to improve as anticipated.    Crystal Roys, PA-C

## 2023-08-22 ENCOUNTER — Encounter: Payer: Self-pay | Admitting: Hematology and Oncology

## 2023-08-22 ENCOUNTER — Other Ambulatory Visit: Payer: Self-pay

## 2023-08-23 ENCOUNTER — Encounter: Payer: Self-pay | Admitting: Hematology and Oncology

## 2023-08-23 ENCOUNTER — Other Ambulatory Visit: Payer: Self-pay

## 2023-09-03 ENCOUNTER — Other Ambulatory Visit: Payer: Self-pay

## 2023-09-14 ENCOUNTER — Encounter: Payer: Self-pay | Admitting: Hematology and Oncology

## 2023-09-14 ENCOUNTER — Ambulatory Visit: Payer: Self-pay | Admitting: Internal Medicine

## 2023-09-20 ENCOUNTER — Other Ambulatory Visit (HOSPITAL_COMMUNITY): Payer: Self-pay

## 2023-09-20 ENCOUNTER — Other Ambulatory Visit: Payer: Self-pay

## 2023-10-18 ENCOUNTER — Other Ambulatory Visit: Payer: Self-pay

## 2023-10-18 ENCOUNTER — Other Ambulatory Visit: Payer: Self-pay | Admitting: Internal Medicine

## 2023-10-18 DIAGNOSIS — E1159 Type 2 diabetes mellitus with other circulatory complications: Secondary | ICD-10-CM

## 2023-10-19 NOTE — Telephone Encounter (Signed)
 Requested medication (s) are due for refill today: yes  Requested medication (s) are on the active medication list: yes  Last refill:  05/17/23 #135 1 RF  Future visit scheduled: no  Notes to clinic:  overdue lab work    Requested Prescriptions  Pending Prescriptions Disp Refills   hydrALAZINE  (APRESOLINE ) 50 MG tablet [Pharmacy Med Name: HYDRALAZINE  50 MG TABLET] 405 tablet 1    Sig: TAKE 1.5 TABLETS BY MOUTH 3 TIMES A DAY     Cardiovascular:  Vasodilators Failed - 10/19/2023  1:08 PM      Failed - ANA Screen, Ifa, Serum in normal range and within 360 days    No results found for: ANA, ANATITER, LABANTI       Failed - Last BP in normal range    BP Readings from Last 1 Encounters:  05/17/23 (!) 163/90         Passed - HCT in normal range and within 360 days    Hematocrit  Date Value Ref Range Status  05/17/2023 44.5 34.0 - 46.6 % Final         Passed - HGB in normal range and within 360 days    Hemoglobin  Date Value Ref Range Status  05/17/2023 14.8 11.1 - 15.9 g/dL Final         Passed - RBC in normal range and within 360 days    RBC  Date Value Ref Range Status  05/17/2023 4.98 3.77 - 5.28 x10E6/uL Final  10/27/2021 4.28 3.87 - 5.11 MIL/uL Final   RBC.  Date Value Ref Range Status  10/27/2021 4.27 3.87 - 5.11 MIL/uL Final         Passed - WBC in normal range and within 360 days    WBC  Date Value Ref Range Status  05/17/2023 5.0 3.4 - 10.8 x10E3/uL Final  05/23/2021 5.4 4.0 - 10.5 K/uL Final   WBC Count  Date Value Ref Range Status  10/27/2021 4.5 4.0 - 10.5 K/uL Final         Passed - PLT in normal range and within 360 days    Platelets  Date Value Ref Range Status  05/17/2023 233 150 - 450 x10E3/uL Final         Passed - Valid encounter within last 12 months    Recent Outpatient Visits           5 months ago Diabetes mellitus treated with oral medication (HCC)   [facility] [place]. [facility]  Vicci Sober B, [place]   6 months ago Type 2 diabetes mellitus with other specified complication, without long-term current use of insulin  [facility])   [facility] [place]. [facility], Jon M, [place]   9 months ago Type 2 diabetes mellitus with other specified complication, without long-term current use of insulin  [facility])   [facility] [place]. [facility] Vicci Sober NOVAK, [place]   1 year ago Uncontrolled type 2 diabetes mellitus with hyperglycemia [facility])   [facility] [place]. [facility] Vicci Sober NOVAK, [place]   1 year ago Uncontrolled type 2 diabetes mellitus with hyperglycemia [facility])    [facility] [place]. [facility] Vicci Sober NOVAK, [place]

## 2023-10-31 ENCOUNTER — Telehealth: Payer: Self-pay | Admitting: Internal Medicine

## 2023-10-31 NOTE — Telephone Encounter (Signed)
 Confirmed appt for 8/14

## 2023-11-01 ENCOUNTER — Ambulatory Visit: Attending: Internal Medicine | Admitting: Internal Medicine

## 2023-11-01 VITALS — BP 186/79 | HR 59 | Temp 98.2°F | Ht 66.0 in | Wt 177.0 lb

## 2023-11-01 DIAGNOSIS — F330 Major depressive disorder, recurrent, mild: Secondary | ICD-10-CM

## 2023-11-01 DIAGNOSIS — R202 Paresthesia of skin: Secondary | ICD-10-CM | POA: Diagnosis not present

## 2023-11-01 DIAGNOSIS — M5412 Radiculopathy, cervical region: Secondary | ICD-10-CM

## 2023-11-01 DIAGNOSIS — E0590 Thyrotoxicosis, unspecified without thyrotoxic crisis or storm: Secondary | ICD-10-CM

## 2023-11-01 DIAGNOSIS — E1169 Type 2 diabetes mellitus with other specified complication: Secondary | ICD-10-CM | POA: Diagnosis not present

## 2023-11-01 DIAGNOSIS — Z794 Long term (current) use of insulin: Secondary | ICD-10-CM

## 2023-11-01 DIAGNOSIS — F4310 Post-traumatic stress disorder, unspecified: Secondary | ICD-10-CM

## 2023-11-01 DIAGNOSIS — E119 Type 2 diabetes mellitus without complications: Secondary | ICD-10-CM

## 2023-11-01 DIAGNOSIS — Z7984 Long term (current) use of oral hypoglycemic drugs: Secondary | ICD-10-CM

## 2023-11-01 DIAGNOSIS — I152 Hypertension secondary to endocrine disorders: Secondary | ICD-10-CM

## 2023-11-01 DIAGNOSIS — E785 Hyperlipidemia, unspecified: Secondary | ICD-10-CM

## 2023-11-01 DIAGNOSIS — E1159 Type 2 diabetes mellitus with other circulatory complications: Secondary | ICD-10-CM

## 2023-11-01 DIAGNOSIS — F411 Generalized anxiety disorder: Secondary | ICD-10-CM

## 2023-11-01 DIAGNOSIS — Z1231 Encounter for screening mammogram for malignant neoplasm of breast: Secondary | ICD-10-CM

## 2023-11-01 MED ORDER — SERTRALINE HCL 100 MG PO TABS: 200.0000 mg | ORAL_TABLET | Freq: Every day | ORAL | 1 refills | Status: DC

## 2023-11-01 MED ORDER — AMLODIPINE BESYLATE 10 MG PO TABS: 10.0000 mg | ORAL_TABLET | Freq: Every day | ORAL | 1 refills | Status: AC

## 2023-11-01 MED ORDER — BISOPROLOL FUMARATE 10 MG PO TABS: 10.0000 mg | ORAL_TABLET | Freq: Every day | ORAL | 0 refills | Status: DC

## 2023-11-01 MED ORDER — METFORMIN HCL ER 500 MG PO TB24: 1000.0000 mg | ORAL_TABLET | Freq: Every day | ORAL | 1 refills | Status: DC

## 2023-11-01 MED ORDER — QUETIAPINE FUMARATE 50 MG PO TABS: 50.0000 mg | ORAL_TABLET | Freq: Every day | ORAL | 1 refills | Status: AC

## 2023-11-01 MED ORDER — GABAPENTIN 300 MG PO CAPS: 300.0000 mg | ORAL_CAPSULE | Freq: Every day | ORAL | 1 refills | Status: AC

## 2023-11-01 MED ORDER — OLMESARTAN MEDOXOMIL 40 MG PO TABS: 40.0000 mg | ORAL_TABLET | Freq: Every day | ORAL | 3 refills | Status: AC

## 2023-11-01 NOTE — Progress Notes (Unsigned)
 Patient ID: ALYZE LAUF, female    DOB: 1970/11/03  MRN: [id number]  CC: Diabetes (DM f/u.  Med refill. /Discuss injection or /Tingling on L arm - tension/ tightness/Painful knots on R hand, intermittent swelling x6 mo  )   Subjective: Crystal Vasquez is a 53 y.o. female who presents for chronic ds management. Her concerns today include:  Patient with history of HTN, DM type II, HL, MDD/GAD/PTSD/possible ADHD followed by psychiatry, Thyroid  ds   Discussed the use of AI scribe software for clinical note transcription with the patient, who gave verbal consent to proceed.  History of Present Illness Crystal Vasquez is a 53 year old female with diabetes, hypertension, and hyperlipidemia who presents for follow-up.  She is experiencing challenges with medication adherence, particularly with her blood pressure and diabetes medications. She has recently started using a medication box to help with consistency. She is on multiple antihypertensive medications, including amlodipine  10 mg daily, bisoprolol  10 mg daily, Benicar  40 mg, and hydrochlorothiazide  25 mg daily and hydralazine  75 mg TID, but she does not take hydralazine  as prescribed as she forgets to take it three times a day.  Did takes meds this morning.  On atorvastatin  for cholesterol.  DM: A1C 8.5/BS233 Her diabetes management is suboptimal, with an A1c of 8.5%, up from 8.3% previously. Should be on Farxiga  10 mg daily, Lantus  6 units daily, Glucotrol  10 mg 1.5 tabs twice a day and Metformin  1 gram  BID.  Admits that she has not been consistent in taking meds and could not afford the Farxiga .  She experiences tingling in her left arm occurring occasionally, lasting a few minutes, and associates it with neck tightness, which she describes as more constant. She sleeps in her car, curled up in a ball which may contribute to her discomfort. No CP.  She has a history of depression and anxiety, previously managed with  Zoloft , Seroquel , and hydroxyzine . She is currently without a behavioral health provider but has a counselor through her employer [employer]. She feels more stable since moving to a new work environment but acknowledges her depression is not in remission.  She mentions having knots on her right hand for about six months, which are painful when lifting objects.   She also has a history of subclinical hyperthyroid and was being followed by endocrinologist Dr. Kellie. Would like to get back in now that she has insurance.   Socially, she is currently living in her car and is working with [employer] to improve her financial situation. She is saving for a larger car and eventually stable housing.    Patient Active Problem List   Diagnosis Date Noted   GAD (generalized anxiety disorder) 08/02/2023   MDD (major depressive disorder), recurrent episode, moderate (HCC) 08/02/2023   Binge eating 08/02/2023   Subclinical hyperthyroidism 02/20/2022   Hyperlipidemia associated with type 2 diabetes mellitus (HCC) 01/24/2022   Excessive daytime sleepiness 10/31/2021   Snoring 10/31/2021   Fever postop 05/19/2021   Uterine polyp 05/09/2021   Hypertension associated with type 2 diabetes mellitus (HCC) 12/03/2020   Uncontrolled type 2 diabetes mellitus with hyperglycemia (HCC) 12/03/2020   Uterine mass 12/03/2020   Abnormal uterine bleeding (AUB) 11/11/2020   History of uterine fibroid 11/11/2020   Iron deficiency anemia due to chronic blood loss 11/11/2020   History of menorrhagia 11/11/2020     Current Outpatient Medications on File Prior to Visit  Medication Sig Dispense Refill   ACETAMINOPHEN  EXTRA STRENGTH 500 MG capsule  SMARTSIG:2 Capsule(s) By Mouth Daily PRN     amLODipine  (NORVASC ) 10 MG tablet Take 1 tablet (10 mg total) by mouth daily. 90 tablet 1   atorvastatin  (LIPITOR) 40 MG tablet Take 1 tablet (40 mg total) by mouth daily. Stop simvastatin  90 tablet 1   bisoprolol  (ZEBETA ) 10 MG tablet Take 1  tablet (10 mg total) by mouth daily. 90 tablet 0   cyclobenzaprine  (FLEXERIL ) 5 MG tablet Take 1 tablet (5 mg total) by mouth 2 (two) times daily as needed for muscle spasms. 30 tablet 0   dapagliflozin  propanediol (FARXIGA ) 10 MG TABS tablet Take 1 tablet (10 mg total) by mouth daily before breakfast. 30 tablet 3   ferrous sulfate  325 (65 FE) MG tablet Take 325 mg by mouth in the morning.     fluticasone  (FLONASE ) 50 MCG/ACT nasal spray Place 2 sprays into both nostrils daily. 16 g 0   gabapentin  (NEURONTIN ) 300 MG capsule Take 1 capsule (300 mg total) by mouth at bedtime. 90 capsule 1   glipiZIDE  (GLUCOTROL ) 10 MG tablet Take 1.5 tablets (15 mg total) by mouth 2 (two) times daily before a meal. 270 tablet 2   hydrALAZINE  (APRESOLINE ) 50 MG tablet TAKE 1.5 TABLETS BY MOUTH 3 TIMES A DAY 405 tablet 1   hydrochlorothiazide  (HYDRODIURIL ) 25 MG tablet Take 1 tablet (25 mg total) by mouth daily. 90 tablet 1   hydrOXYzine  (ATARAX ) 10 MG tablet Take 1 tablet (10 mg total) by mouth 3 (three) times daily as needed for anxiety. 90 tablet 2   insulin  glargine (LANTUS  SOLOSTAR) 100 UNIT/ML Solostar Pen Inject 6 Units into the skin daily. 15 mL PRN   Insulin  Pen Needle (PEN NEEDLES) 31G X 8 MM MISC use as directed 100 each 6   meloxicam  (MOBIC ) 15 MG tablet Take 1 tablet (15 mg total) by mouth daily as needed. 30 tablet 2   metFORMIN  (GLUCOPHAGE -XR) 500 MG 24 hr tablet Take 2 tablets (1,000 mg total) by mouth daily with breakfast. 180 tablet 1   olmesartan  (BENICAR ) 40 MG tablet Take 1 tablet (40 mg total) by mouth daily. 90 tablet 3   ondansetron  (ZOFRAN -ODT) 4 MG disintegrating tablet Take 1 tablet (4 mg total) by mouth every 8 (eight) hours as needed for nausea or vomiting. 20 tablet 0   QUEtiapine  (SEROQUEL ) 50 MG tablet Take 1 tablet (50 mg total) by mouth at bedtime. 30 tablet 2   sertraline  (ZOLOFT ) 100 MG tablet Take 2 tablets (200 mg total) by mouth daily. 60 tablet 2   No current  facility-administered medications on file prior to visit.    No Known Allergies  Social History   Socioeconomic History   Marital status: Single    Spouse name: Not on file   Number of children: Not on file   Years of education: Not on file   Highest education level: Associate degree: occupational, Scientist, product/process development, or vocational program  Occupational History   Not on file  Tobacco Use   Smoking status: Never    Passive exposure: Never   Smokeless tobacco: Never  Vaping Use   Vaping status: Never Used  Substance and Sexual Activity   Alcohol use: Not Currently   Drug use: Never   Sexual activity: Not Currently  Other Topics Concern   Not on file  Social History Narrative   Not on file   Social Drivers of Health   Financial Resource Strain: Medium Risk (10/31/2023)   Overall Financial Resource Strain (CARDIA)    Difficulty  of Paying Living Expenses: Somewhat hard  Food Insecurity: Food Insecurity Present (10/31/2023)   Hunger Vital Sign    Worried About Running Out of Food in the Last Year: Sometimes true    Ran Out of Food in the Last Year: Sometimes true  Transportation Needs: Unmet Transportation Needs (10/31/2023)   PRAPARE - Transportation    Lack of Transportation (Medical): No    Lack of Transportation (Non-Medical): Yes  Physical Activity: Inactive (10/31/2023)   Exercise Vital Sign    Days of Exercise per Week: 0 days    Minutes of Exercise per Session: Not on file  Stress: Stress Concern Present (10/31/2023)   [employer] of Occupational Health - Occupational Stress Questionnaire    Feeling of Stress: Rather much  Social Connections: Unknown (10/31/2023)   Social Connection and Isolation Panel    Frequency of Communication with Friends and Family: More than three times a week    Frequency of Social Gatherings with Friends and Family: Three times a week    Attends Religious Services: Patient declined    Active Member of Clubs or Organizations: No    Attends  Banker Meetings: Not on file    Marital Status: Patient declined  Intimate Partner Violence: Not At Risk (08/02/2023)   Humiliation, Afraid, Rape, and Kick questionnaire    Fear of Current or Ex-Partner: No    Emotionally Abused: No    Physically Abused: No    Sexually Abused: No    Family History  Problem Relation Age of Onset   Diabetes Mother    Hypertension Mother    Diabetes Father    Hypertension Father    Endometrial cancer Maternal Aunt    Colon cancer Neg Hx    Breast cancer Neg Hx    Ovarian cancer Neg Hx    Pancreatic cancer Neg Hx    Prostate cancer Neg Hx     Past Surgical History:  Procedure Laterality Date   ABDOMINAL HYSTERECTOMY     BIOPSY THYROID      IR ANGIOGRAM PELVIS SELECTIVE OR SUPRASELECTIVE  05/09/2021   IR ANGIOGRAM SELECTIVE EACH ADDITIONAL VESSEL  05/09/2021   IR AORTAGRAM ABDOMINAL SERIALOGRAM  05/09/2021   IR EMBO TUMOR ORGAN ISCHEMIA INFARCT INC GUIDE ROADMAPPING  05/09/2021   IR RADIOLOGIST EVAL & MGMT  04/11/2021   IR US  GUIDE VASC ACCESS LEFT  05/09/2021   ROBOTIC ASSISTED LAPAROSCOPIC HYSTERECTOMY AND SALPINGECTOMY Bilateral 05/18/2021   Procedure: XI ROBOTIC ASSISTED LAPAROSCOPIC HYSTERECTOMY AND SALPINGECTOMY, UTERUS MORE THAN 250 GRAMS;  Surgeon: Viktoria Comer SAUNDERS, MD;  Location: [facility];  Service: Gynecology;  Laterality: Bilateral;    ROS: Review of Systems Negative except as stated above  PHYSICAL EXAM: BP (!) 155/88 (BP Location: Left Arm, Patient Position: Sitting, Cuff Size: Normal)   Pulse (!) 59   Temp 98.2 F (36.8 C) (Oral)   Ht 5' 6 (1.676 m)   Wt 177 lb (80.3 kg)   LMP 04/27/2021 (Exact Date)   SpO2 100%   BMI 28.57 kg/m   Physical Exam  General appearance - alert, well appearing, and in no distress Mental status - normal mood, behavior, speech, dress, motor activity, and thought processes Neck: no thyromegaly or nodules palpable Chest - clear to auscultation, no wheezes, rales or rhonchi,  symmetric air entry Heart - normal rate, regular rhythm, normal S1, S2, no murmurs, rubs, clicks or gallops Neurological - normal grip BL, gross sensation intact in the arms Musculoskeletal - RT wrist and forearm: no  masses nodules appreciated at this time; good ROM wrist Extremities - peripheral pulses normal, no pedal edema, no clubbing or cyanosis      Latest Ref Rng & Units 05/17/2023   11:27 AM 10/27/2021    9:56 AM 05/23/2021    4:09 AM  CMP  Glucose 70 - 99 mg/dL 81  821  863   BUN 6 - 24 mg/dL 12  16  10    Creatinine 0.57 - 1.00 mg/dL 9.36  9.27  9.45   Sodium 134 - 144 mmol/L 143  139  137   Potassium 3.5 - 5.2 mmol/L 4.3  3.3  4.4   Chloride 96 - 106 mmol/L 104  101  104   CO2 20 - 29 mmol/L 22  31  22    Calcium  8.7 - 10.2 mg/dL 9.9  9.5  9.0   Total Protein 6.0 - 8.5 g/dL 7.8  7.7    Total Bilirubin 0.0 - 1.2 mg/dL 0.3  0.2    Alkaline Phos 44 - 121 IU/L 87  60    AST 0 - 40 IU/L 18  16    ALT 0 - 32 IU/L 19  19     Lipid Panel     Component Value Date/Time   CHOL 253 (H) 05/17/2023 1127   TRIG 225 (H) 05/17/2023 1127   HDL 62 05/17/2023 1127   CHOLHDL 4.1 05/17/2023 1127   LDLCALC 150 (H) 05/17/2023 1127    CBC    Component Value Date/Time   WBC 5.0 05/17/2023 1127   WBC 4.5 10/27/2021 0956   WBC 5.4 05/23/2021 0409   RBC 4.98 05/17/2023 1127   RBC 4.28 10/27/2021 0956   RBC 4.27 10/27/2021 0955   HGB 14.8 05/17/2023 1127   HCT 44.5 05/17/2023 1127   PLT 233 05/17/2023 1127   MCV 89 05/17/2023 1127   MCH 29.7 05/17/2023 1127   MCH 30.6 10/27/2021 0956   MCHC 33.3 05/17/2023 1127   MCHC 34.7 10/27/2021 0956   RDW 12.9 05/17/2023 1127   LYMPHSABS 1.7 10/27/2021 0956   LYMPHSABS 1.9 11/11/2020 1545   MONOABS 0.4 10/27/2021 0956   EOSABS 0.1 10/27/2021 0956   EOSABS 0.1 11/11/2020 1545   BASOSABS 0.0 10/27/2021 0956   BASOSABS 0.0 11/11/2020 1545    ASSESSMENT AND PLAN: 1. Type 2 diabetes mellitus with other specified complication, with long-term  current use of insulin  (HCC) (Primary) Not at goal due to inconsistent use of medications.  The fact that she is living out of her car probably also plays a role.  It is good that she now has a medication box.  Encouraged her to set an alarm on her phone to remind her to take her medications.  We will take Farxiga  of the medication list since she has not been able to afford it.  Continue Lantus  insulin  6 units daily which she can take in the mornings if it makes it easier for her to remember to take it, metformin  1 g twice a day, glipizide  10 mg 1-1/2 tablets daily. - metFORMIN  (GLUCOPHAGE -XR) 500 MG 24 hr tablet; Take 2 tablets (1,000 mg total) by mouth daily with breakfast.  Dispense: 180 tablet; Refill: 1 - POCT glycosylated hemoglobin (Hb A1C) - POCT glucose (manual entry) - Ambulatory referral to Ophthalmology - Ambulatory referral to Endocrinology  2. Diabetes mellitus treated with oral medication (HCC) See #1 above.  3. Hypertension associated with type 2 diabetes mellitus (HCC) Not at goal due to inconsistent use of medications.  Encouraged her to set an alarm on her phone.  Continue current medications listed above - amLODipine  (NORVASC ) 10 MG tablet; Take 1 tablet (10 mg total) by mouth daily.  Dispense: 90 tablet; Refill: 1 - bisoprolol  (ZEBETA ) 10 MG tablet; Take 1 tablet (10 mg total) by mouth daily.  Dispense: 90 tablet; Refill: 0 - olmesartan  (BENICAR ) 40 MG tablet; Take 1 tablet (40 mg total) by mouth daily.  Dispense: 90 tablet; Refill: 3  4. Hyperlipidemia associated with type 2 diabetes mellitus (HCC) Continue atorvastatin .  5. Tingling of left upper extremity Advised we will observe for now.  6. Cervical radiculopathy Patient needing refill on gabapentin .  Issues with her neck likely related to sleeping position at nights.  She is currently sleeping out of her car. - gabapentin  (NEURONTIN ) 300 MG capsule; Take 1 capsule (300 mg total) by mouth at bedtime.  Dispense: 90  capsule; Refill: 1  7. Major depressive disorder, recurrent episode, mild (HCC) Discussed referral to psychiatrist since she cannot return to her previous provider now that she has insurance.  Patient states she will do this on her own.  She was given a printout of behavioral health resources in the [place] area.  Refill given on Seroquel  and Zoloft . - QUEtiapine  (SEROQUEL ) 50 MG tablet; Take 1 tablet (50 mg total) by mouth at bedtime.  Dispense: 30 tablet; Refill: 1 - sertraline  (ZOLOFT ) 100 MG tablet; Take 2 tablets (200 mg total) by mouth daily.  Dispense: 60 tablet; Refill: 1  8. Generalized anxiety disorder - QUEtiapine  (SEROQUEL ) 50 MG tablet; Take 1 tablet (50 mg total) by mouth at bedtime.  Dispense: 30 tablet; Refill: 1 - sertraline  (ZOLOFT ) 100 MG tablet; Take 2 tablets (200 mg total) by mouth daily.  Dispense: 60 tablet; Refill: 1  9. PTSD (post-traumatic stress disorder) - QUEtiapine  (SEROQUEL ) 50 MG tablet; Take 1 tablet (50 mg total) by mouth at bedtime.  Dispense: 30 tablet; Refill: 1 - sertraline  (ZOLOFT ) 100 MG tablet; Take 2 tablets (200 mg total) by mouth daily.  Dispense: 60 tablet; Refill: 1  10. Subclinical hyperthyroidism - TSH+T4F+T3Free - Ambulatory referral to Endocrinology  11. Encounter for screening mammogram for malignant neoplasm of breast - MM 3D SCREENING MAMMOGRAM BILATERAL BREAST; Future  No nodules were seen on the right wrist.  Advised patient to call for urgent care appointment if there is any recurrence.  Patient was given the opportunity to ask questions.  Patient verbalized understanding of the plan and was able to repeat key elements of the plan.   This documentation was completed using Paediatric nurse.  Any transcriptional errors are unintentional.  No orders of the defined types were placed in this encounter.    Requested Prescriptions   Pending Prescriptions Disp Refills   amLODipine  (NORVASC ) 10 MG tablet 90  tablet 1    Sig: Take 1 tablet (10 mg total) by mouth daily.   bisoprolol  (ZEBETA ) 10 MG tablet 90 tablet 0    Sig: Take 1 tablet (10 mg total) by mouth daily.   gabapentin  (NEURONTIN ) 300 MG capsule 90 capsule 1    Sig: Take 1 capsule (300 mg total) by mouth at bedtime.   metFORMIN  (GLUCOPHAGE -XR) 500 MG 24 hr tablet 180 tablet 1    Sig: Take 2 tablets (1,000 mg total) by mouth daily with breakfast.   olmesartan  (BENICAR ) 40 MG tablet 90 tablet 3    Sig: Take 1 tablet (40 mg total) by mouth daily.   QUEtiapine  (SEROQUEL ) 50 MG tablet  30 tablet 2    Sig: Take 1 tablet (50 mg total) by mouth at bedtime.   sertraline  (ZOLOFT ) 100 MG tablet 60 tablet 2    Sig: Take 2 tablets (200 mg total) by mouth daily.    No follow-ups on file.  Barnie Louder, MD, FACP

## 2023-11-01 NOTE — Patient Instructions (Signed)
 VISIT SUMMARY:  Today, we discussed your ongoing health concerns, including diabetes, hypertension, hyperlipidemia, depression, anxiety, thyroid  issues, neck pain, and right hand pain. We reviewed your current medications and made some adjustments to help improve your adherence and overall health.  YOUR PLAN:  -TYPE 2 DIABETES MELLITUS: Type 2 diabetes is a condition where your body does not use insulin  properly, leading to high blood sugar levels. Your A1c has increased to 8.5%, indicating that your blood sugar levels are not well controlled. We discussed the importance of taking your Lantus  insulin  every morning along with your other medications. Setting alarms may help you remember to take your medications consistently. We also submitted a referral for a diabetic eye exam.  -HYPERTENSION: Hypertension, or high blood pressure, can lead to serious health problems if not managed properly. Your blood pressure is currently elevated at 155/88 mmHg. It is important to take all your antihypertensive medications consistently, especially hydralazine  three times daily. We may consider a sleep study to see if your sleeping conditions are affecting your blood pressure.  -HYPERLIPIDEMIA: Hyperlipidemia is a condition where you have high levels of fats in your blood, which can increase your risk of heart disease. You are currently taking Zocor  in the morning to help manage this condition.  -DEPRESSION AND ANXIETY DISORDER: Depression and anxiety are mental health conditions that can affect your mood and overall well-being. Your depression is currently mild but not in remission. We provided you with a list of behavioral health providers in [place] and ensured that your new provider can prescribe your medications. Temporary refills for Seroquel  and Zoloft  were provided.  -SUBCLINICAL HYPOTHYROIDISM: Subclinical hypothyroidism is a mild form of underactive thyroid  where your thyroid  hormone levels are slightly  low. We ordered thyroid  hormone level tests to monitor your condition.  -NECK PAIN AND STIFFNESS WITH LEFT ARM PARESTHESIA: You are experiencing neck pain and occasional tingling in your left arm, likely due to your sleeping conditions in the car. We will monitor this and address it as needed.  -RIGHT HAND PAIN: You have been experiencing pain and swelling in your right hand for six months. If the symptoms worsen or you notice any nodules, please call for an urgent care visit.  INSTRUCTIONS:  Please follow up with the diabetic eye exam referral and the thyroid  hormone level tests. Make sure to take your medications as prescribed and consider setting alarms to help with adherence. If your right hand pain worsens or you notice any nodules, call for an urgent care visit. Additionally, follow up with a behavioral health provider in [place] for your depression and anxiety management.

## 2023-11-02 ENCOUNTER — Ambulatory Visit: Payer: Self-pay | Admitting: Internal Medicine

## 2023-11-02 LAB — TSH+T4F+T3FREE
Free T4: 1.14 ng/dL (ref 0.82–1.77)
T3, Free: 3.2 pg/mL (ref 2.0–4.4)
TSH: 0.398 u[IU]/mL — ABNORMAL LOW (ref 0.450–4.500)

## 2023-11-04 ENCOUNTER — Encounter: Payer: Self-pay | Admitting: Internal Medicine

## 2023-11-05 LAB — POCT GLYCOSYLATED HEMOGLOBIN (HGB A1C): HbA1c, POC (controlled diabetic range): 8.5 % — AB (ref 0.0–7.0)

## 2023-11-05 LAB — GLUCOSE, POCT (MANUAL RESULT ENTRY): POC Glucose: 233 mg/dL — AB (ref 70–99)

## 2023-11-07 ENCOUNTER — Other Ambulatory Visit: Payer: Self-pay | Admitting: Physician Assistant

## 2023-11-07 DIAGNOSIS — I152 Hypertension secondary to endocrine disorders: Secondary | ICD-10-CM

## 2023-11-08 ENCOUNTER — Other Ambulatory Visit (HOSPITAL_BASED_OUTPATIENT_CLINIC_OR_DEPARTMENT_OTHER): Payer: Self-pay

## 2023-11-08 MED ORDER — HYDROCHLOROTHIAZIDE 25 MG PO TABS
25.0000 mg | ORAL_TABLET | Freq: Every day | ORAL | 1 refills | Status: AC
  Filled 2023-11-08: qty 90, 90d supply, fill #0

## 2023-11-09 ENCOUNTER — Other Ambulatory Visit (HOSPITAL_BASED_OUTPATIENT_CLINIC_OR_DEPARTMENT_OTHER): Payer: Self-pay

## 2023-11-09 ENCOUNTER — Other Ambulatory Visit: Payer: Self-pay

## 2023-11-26 ENCOUNTER — Other Ambulatory Visit: Payer: Self-pay | Admitting: Internal Medicine

## 2023-11-26 DIAGNOSIS — F330 Major depressive disorder, recurrent, mild: Secondary | ICD-10-CM

## 2023-11-26 DIAGNOSIS — F4310 Post-traumatic stress disorder, unspecified: Secondary | ICD-10-CM

## 2023-11-26 DIAGNOSIS — F411 Generalized anxiety disorder: Secondary | ICD-10-CM

## 2023-12-06 ENCOUNTER — Telehealth: Admitting: Family

## 2023-12-06 DIAGNOSIS — L255 Unspecified contact dermatitis due to plants, except food: Secondary | ICD-10-CM

## 2023-12-06 MED ORDER — PREDNISONE 20 MG PO TABS: 40.0000 mg | ORAL_TABLET | Freq: Every day | ORAL | 0 refills | Status: AC

## 2023-12-06 MED ORDER — TRIAMCINOLONE ACETONIDE 0.5 % EX OINT: 1.0000 | TOPICAL_OINTMENT | Freq: Two times a day (BID) | CUTANEOUS | 0 refills | Status: AC

## 2023-12-06 NOTE — Progress Notes (Signed)
 Virtual Visit Consent   Crystal Vasquez, you are scheduled for a virtual visit with a [facility] provider today. Just as with appointments in the office, your consent must be obtained to participate. Your consent will be active for this visit and any virtual visit you may have with one of our providers in the next 365 days. If you have a MyChart account, a copy of this consent can be sent to you electronically.  As this is a virtual visit, video technology does not allow for your provider to perform a traditional examination. This may limit your provider's ability to fully assess your condition. If your provider identifies any concerns that need to be evaluated in person or the need to arrange testing (such as labs, EKG, etc.), we will make arrangements to do so. Although advances in technology are sophisticated, we cannot ensure that it will always work on either your end or our end. If the connection with a video visit is poor, the visit may have to be switched to a telephone visit. With either a video or telephone visit, we are not always able to ensure that we have a secure connection.  By engaging in this virtual visit, you consent to the provision of healthcare and authorize for your insurance to be billed (if applicable) for the services provided during this visit. Depending on your insurance coverage, you may receive a charge related to this service.  I need to obtain your verbal consent now. Are you willing to proceed with your visit today? [name] has provided verbal consent on 12/06/2023 for a virtual visit (video or telephone). Bari Learn, FNP  Date: 12/06/2023 7:48 PM   Virtual Visit via Video Note   I, Bari Learn, connected with  Crystal Vasquez  ([id number], 08/30/1970) on 12/06/23 at  7:30 PM EDT by a video-enabled telemedicine application and verified that I am speaking with the correct person using two identifiers.  Location: Patient: Virtual Visit  Location Patient: Other: car Provider: Virtual Visit Location Provider: Home Office   I discussed the limitations of evaluation and management by telemedicine and the availability of in person appointments. The patient expressed understanding and agreed to proceed.    History of Present Illness: Crystal Vasquez is a 53 y.o. who identifies as a female who was assigned female at birth, and is being seen today for rash on gluteal cheeks that she noticed two days ago. She reports she went to the bathroom outside Sunday and used leaves to wipe. Unsure if that was poison oak or ivy.    HPI: Rash This is a new problem. The current episode started in the past 7 days. The problem has been gradually worsening since onset. Location: buttocks. The rash is characterized by itchiness, redness and pain.    Problems:  Patient Active Problem List   Diagnosis Date Noted   GAD (generalized anxiety disorder) 08/02/2023   MDD (major depressive disorder), recurrent episode, moderate (HCC) 08/02/2023   Binge eating 08/02/2023   Subclinical hyperthyroidism 02/20/2022   Hyperlipidemia associated with type 2 diabetes mellitus (HCC) 01/24/2022   Excessive daytime sleepiness 10/31/2021   Snoring 10/31/2021   Fever postop 05/19/2021   Uterine polyp 05/09/2021   Hypertension associated with type 2 diabetes mellitus (HCC) 12/03/2020   Uncontrolled type 2 diabetes mellitus with hyperglycemia (HCC) 12/03/2020   Uterine mass 12/03/2020   Abnormal uterine bleeding (AUB) 11/11/2020   History of uterine fibroid 11/11/2020   Iron deficiency anemia due to chronic  blood loss 11/11/2020   History of menorrhagia 11/11/2020    Allergies: No Known Allergies Medications:  Current Outpatient Medications:    predniSONE  (DELTASONE ) 20 MG tablet, Take 2 tablets (40 mg total) by mouth daily with breakfast for 5 days., Disp: 10 tablet, Rfl: 0   triamcinolone  ointment (KENALOG ) 0.5 %, Apply 1 Application topically 2 (two) times  daily., Disp: 30 g, Rfl: 0   ACETAMINOPHEN  EXTRA STRENGTH 500 MG capsule, SMARTSIG:2 Capsule(s) By Mouth Daily PRN, Disp: , Rfl:    amLODipine  (NORVASC ) 10 MG tablet, Take 1 tablet (10 mg total) by mouth daily., Disp: 90 tablet, Rfl: 1   atorvastatin  (LIPITOR) 40 MG tablet, Take 1 tablet (40 mg total) by mouth daily. Stop simvastatin , Disp: 90 tablet, Rfl: 1   bisoprolol  (ZEBETA ) 10 MG tablet, Take 1 tablet (10 mg total) by mouth daily., Disp: 90 tablet, Rfl: 0   cyclobenzaprine  (FLEXERIL ) 5 MG tablet, Take 1 tablet (5 mg total) by mouth 2 (two) times daily as needed for muscle spasms., Disp: 30 tablet, Rfl: 0   ferrous sulfate  325 (65 FE) MG tablet, Take 325 mg by mouth in the morning., Disp: , Rfl:    fluticasone  (FLONASE ) 50 MCG/ACT nasal spray, Place 2 sprays into both nostrils daily., Disp: 16 g, Rfl: 0   gabapentin  (NEURONTIN ) 300 MG capsule, Take 1 capsule (300 mg total) by mouth at bedtime., Disp: 90 capsule, Rfl: 1   glipiZIDE  (GLUCOTROL ) 10 MG tablet, Take 1.5 tablets (15 mg total) by mouth 2 (two) times daily before a meal., Disp: 270 tablet, Rfl: 2   hydrALAZINE  (APRESOLINE ) 50 MG tablet, TAKE 1.5 TABLETS BY MOUTH 3 TIMES A DAY, Disp: 405 tablet, Rfl: 1   hydrochlorothiazide  (HYDRODIURIL ) 25 MG tablet, Take 1 tablet (25 mg total) by mouth daily., Disp: 90 tablet, Rfl: 1   hydrOXYzine  (ATARAX ) 10 MG tablet, Take 1 tablet (10 mg total) by mouth 3 (three) times daily as needed for anxiety., Disp: 90 tablet, Rfl: 2   insulin  glargine (LANTUS  SOLOSTAR) 100 UNIT/ML Solostar Pen, Inject 6 Units into the skin daily., Disp: 15 mL, Rfl: PRN   Insulin  Pen Needle (PEN NEEDLES) 31G X 8 MM MISC, use as directed, Disp: 100 each, Rfl: 6   meloxicam  (MOBIC ) 15 MG tablet, Take 1 tablet (15 mg total) by mouth daily as needed., Disp: 30 tablet, Rfl: 2   metFORMIN  (GLUCOPHAGE -XR) 500 MG 24 hr tablet, Take 2 tablets (1,000 mg total) by mouth daily with breakfast., Disp: 180 tablet, Rfl: 1   olmesartan   (BENICAR ) 40 MG tablet, Take 1 tablet (40 mg total) by mouth daily., Disp: 90 tablet, Rfl: 3   ondansetron  (ZOFRAN -ODT) 4 MG disintegrating tablet, Take 1 tablet (4 mg total) by mouth every 8 (eight) hours as needed for nausea or vomiting., Disp: 20 tablet, Rfl: 0   QUEtiapine  (SEROQUEL ) 50 MG tablet, Take 1 tablet (50 mg total) by mouth at bedtime., Disp: 30 tablet, Rfl: 1   sertraline  (ZOLOFT ) 100 MG tablet, TAKE 2 TABLETS BY MOUTH EVERY DAY, Disp: 180 tablet, Rfl: 0  Observations/Objective: Patient is well-developed, well-nourished in no acute distress.  Resting comfortably  Head is normocephalic, atraumatic.  No labored breathing.  Speech is clear and coherent with logical content.  Patient is alert and oriented at baseline.    Assessment and Plan: 1. Contact dermatitis due to plant (Primary) - predniSONE  (DELTASONE ) 20 MG tablet; Take 2 tablets (40 mg total) by mouth daily with breakfast for 5 days.  Dispense: 10  tablet; Refill: 0 - triamcinolone  ointment (KENALOG ) 0.5 %; Apply 1 Application topically 2 (two) times daily.  Dispense: 30 g; Refill: 0  Keep clean and dry Avoid scratching  Strict low carb diet, continue medications Will give prednisone . May not be able to tolerate.  Follow up if symptoms worsen or do not improve   Follow Up Instructions: I discussed the assessment and treatment plan with the patient. The patient was provided an opportunity to ask questions and all were answered. The patient agreed with the plan and demonstrated an understanding of the instructions.  A copy of instructions were sent to the patient via MyChart unless otherwise noted below.     The patient was advised to call back or seek an in-person evaluation if the symptoms worsen or if the condition fails to improve as anticipated.    Bari Learn, FNP

## 2023-12-15 ENCOUNTER — Telehealth: Admitting: Family Medicine

## 2023-12-15 DIAGNOSIS — R197 Diarrhea, unspecified: Secondary | ICD-10-CM | POA: Diagnosis not present

## 2023-12-15 MED ORDER — LOPERAMIDE HCL 2 MG PO CAPS: 2.0000 mg | ORAL_CAPSULE | ORAL | 0 refills | Status: AC | PRN

## 2023-12-15 NOTE — Progress Notes (Signed)
 Virtual Visit Consent   Crystal Vasquez, you are scheduled for a virtual visit with a [facility] provider today. Just as with appointments in the office, your consent must be obtained to participate. Your consent will be active for this visit and any virtual visit you may have with one of our providers in the next 365 days. If you have a MyChart account, a copy of this consent can be sent to you electronically.  As this is a virtual visit, video technology does not allow for your provider to perform a traditional examination. This may limit your provider's ability to fully assess your condition. If your provider identifies any concerns that need to be evaluated in person or the need to arrange testing (such as labs, EKG, etc.), we will make arrangements to do so. Although advances in technology are sophisticated, we cannot ensure that it will always work on either your end or our end. If the connection with a video visit is poor, the visit may have to be switched to a telephone visit. With either a video or telephone visit, we are not always able to ensure that we have a secure connection.  By engaging in this virtual visit, you consent to the provision of healthcare and authorize for your insurance to be billed (if applicable) for the services provided during this visit. Depending on your insurance coverage, you may receive a charge related to this service.  I need to obtain your verbal consent now. Are you willing to proceed with your visit today? Crystal Vasquez has provided verbal consent on 12/15/2023 for a virtual visit (video or telephone). Crystal Vasquez, [place]  Date: 12/15/2023 4:08 PM   Virtual Visit via Video Note   I, Crystal Vasquez, connected with  CARLIE SOLORZANO  ([id number], 1971/01/01) on 12/15/23 at  4:00 PM EDT by a video-enabled telemedicine application and verified that I am speaking with the correct person using two identifiers.  Location: Patient: Virtual Visit  Location Patient: Home Provider: Virtual Visit Location Provider: Home Office   I discussed the limitations of evaluation and management by telemedicine and the availability of in person appointments. The patient expressed understanding and agreed to proceed.    History of Present Illness: Crystal Vasquez is a 53 y.o. who identifies as a female who was assigned female at birth, and is being seen today for c/o having diarrhea for the last couple of weeks.  Pt states her stomach hurts, not hungry has nausea but no vomiting. Pt states its driving her nuts. Pt states has not trued anything for symptoms.  Pt states she thought it was from diet soda and coffee and stopped drinking that.  Pt states she has pain underneath her belly button and slight pain left of her belly button.  Pt states stool is not watery and states she has a little blood because of her hemorrhoid.  Pt states she thinks she had a fever once or twice and took tylenol  and kept going. Pt states not drinking as much fluids and states she is feeling fatigued.  Pt states she is having 2-3 bowel movements a day that are sudden and quick. Pt denies sick contacts. Pt states she has a history of acid reflux.   HPI: HPI  Problems:  Patient Active Problem List   Diagnosis Date Noted   GAD (generalized anxiety disorder) 08/02/2023   MDD (major depressive disorder), recurrent episode, moderate (HCC) 08/02/2023   Binge eating 08/02/2023   Subclinical hyperthyroidism 02/20/2022  Hyperlipidemia associated with type 2 diabetes mellitus (HCC) 01/24/2022   Excessive daytime sleepiness 10/31/2021   Snoring 10/31/2021   Fever postop 05/19/2021   Uterine polyp 05/09/2021   Hypertension associated with type 2 diabetes mellitus (HCC) 12/03/2020   Uncontrolled type 2 diabetes mellitus with hyperglycemia (HCC) 12/03/2020   Uterine mass 12/03/2020   Abnormal uterine bleeding (AUB) 11/11/2020   History of uterine fibroid 11/11/2020   Iron  deficiency anemia due to chronic blood loss 11/11/2020   History of menorrhagia 11/11/2020    Allergies: No Known Allergies Medications:  Current Outpatient Medications:    loperamide (IMODIUM) 2 MG capsule, Take 1 capsule (2 mg total) by mouth as needed for up to 7 days for diarrhea or loose stools (Take 2 capsule x 1 dose then 1 capsule after each loose stool as needed)., Disp: 8 capsule, Rfl: 0   ACETAMINOPHEN  EXTRA STRENGTH 500 MG capsule, SMARTSIG:2 Capsule(s) By Mouth Daily PRN, Disp: , Rfl:    amLODipine  (NORVASC ) 10 MG tablet, Take 1 tablet (10 mg total) by mouth daily., Disp: 90 tablet, Rfl: 1   atorvastatin  (LIPITOR) 40 MG tablet, Take 1 tablet (40 mg total) by mouth daily. Stop simvastatin , Disp: 90 tablet, Rfl: 1   bisoprolol  (ZEBETA ) 10 MG tablet, Take 1 tablet (10 mg total) by mouth daily., Disp: 90 tablet, Rfl: 0   cyclobenzaprine  (FLEXERIL ) 5 MG tablet, Take 1 tablet (5 mg total) by mouth 2 (two) times daily as needed for muscle spasms., Disp: 30 tablet, Rfl: 0   ferrous sulfate  325 (65 FE) MG tablet, Take 325 mg by mouth in the morning., Disp: , Rfl:    fluticasone  (FLONASE ) 50 MCG/ACT nasal spray, Place 2 sprays into both nostrils daily., Disp: 16 g, Rfl: 0   gabapentin  (NEURONTIN ) 300 MG capsule, Take 1 capsule (300 mg total) by mouth at bedtime., Disp: 90 capsule, Rfl: 1   glipiZIDE  (GLUCOTROL ) 10 MG tablet, Take 1.5 tablets (15 mg total) by mouth 2 (two) times daily before a meal., Disp: 270 tablet, Rfl: 2   hydrALAZINE  (APRESOLINE ) 50 MG tablet, TAKE 1.5 TABLETS BY MOUTH 3 TIMES A DAY, Disp: 405 tablet, Rfl: 1   hydrochlorothiazide  (HYDRODIURIL ) 25 MG tablet, Take 1 tablet (25 mg total) by mouth daily., Disp: 90 tablet, Rfl: 1   hydrOXYzine  (ATARAX ) 10 MG tablet, Take 1 tablet (10 mg total) by mouth 3 (three) times daily as needed for anxiety., Disp: 90 tablet, Rfl: 2   insulin  glargine (LANTUS  SOLOSTAR) 100 UNIT/ML Solostar Pen, Inject 6 Units into the skin daily., Disp: 15  mL, Rfl: PRN   Insulin  Pen Needle (PEN NEEDLES) 31G X 8 MM MISC, use as directed, Disp: 100 each, Rfl: 6   meloxicam  (MOBIC ) 15 MG tablet, Take 1 tablet (15 mg total) by mouth daily as needed., Disp: 30 tablet, Rfl: 2   metFORMIN  (GLUCOPHAGE -XR) 500 MG 24 hr tablet, Take 2 tablets (1,000 mg total) by mouth daily with breakfast., Disp: 180 tablet, Rfl: 1   olmesartan  (BENICAR ) 40 MG tablet, Take 1 tablet (40 mg total) by mouth daily., Disp: 90 tablet, Rfl: 3   ondansetron  (ZOFRAN -ODT) 4 MG disintegrating tablet, Take 1 tablet (4 mg total) by mouth every 8 (eight) hours as needed for nausea or vomiting., Disp: 20 tablet, Rfl: 0   QUEtiapine  (SEROQUEL ) 50 MG tablet, Take 1 tablet (50 mg total) by mouth at bedtime., Disp: 30 tablet, Rfl: 1   sertraline  (ZOLOFT ) 100 MG tablet, TAKE 2 TABLETS BY MOUTH EVERY DAY, Disp:  180 tablet, Rfl: 0   triamcinolone  ointment (KENALOG ) 0.5 %, Apply 1 Application topically 2 (two) times daily., Disp: 30 g, Rfl: 0  Observations/Objective: Patient is well-developed, well-nourished in no acute distress.  Resting comfortably at home.  Head is normocephalic, atraumatic.  No labored breathing.  Speech is clear and coherent with logical content.  Patient is alert and oriented at baseline.    Assessment and Plan: 1. Diarrhea, unspecified type (Primary) - loperamide (IMODIUM) 2 MG capsule; Take 1 capsule (2 mg total) by mouth as needed for up to 7 days for diarrhea or loose stools (Take 2 capsule x 1 dose then 1 capsule after each loose stool as needed).  Dispense: 8 capsule; Refill: 0  -Discussed and encouraged increase fluid intake in the form of water  or electrolyte based drinks  -Discussed bland diet and increasing fiber  -Pt advised if no improvement or worsening symptoms to proceed to the emergency room for further evaluation.   Follow Up Instructions: I discussed the assessment and treatment plan with the patient. The patient was provided an opportunity to ask  questions and all were answered. The patient agreed with the plan and demonstrated an understanding of the instructions.  A copy of instructions were sent to the patient via MyChart unless otherwise noted below.    The patient was advised to call back or seek an in-person evaluation if the symptoms worsen or if the condition fails to improve as anticipated.    Crystal Mater, PA-C

## 2023-12-15 NOTE — Patient Instructions (Signed)
 Crystal Vasquez, thank you for joining Roosvelt Mater, PA-C for today's virtual visit.  While this provider is not your primary care provider (PCP), if your PCP is located in our provider database this encounter information will be shared with them immediately following your visit.   A [facility] MyChart account gives you access to today's visit and all your visits, tests, and labs performed at [facility]  click here if you don't have a [facility] MyChart account or go to [email or web address]  Consent: (Patient) Crystal Vasquez provided verbal consent for this virtual visit at the beginning of the encounter.  Current Medications:  Current Outpatient Medications:    loperamide (IMODIUM) 2 MG capsule, Take 1 capsule (2 mg total) by mouth as needed for up to 7 days for diarrhea or loose stools (Take 2 capsule x 1 dose then 1 capsule after each loose stool as needed)., Disp: 8 capsule, Rfl: 0   ACETAMINOPHEN  EXTRA STRENGTH 500 MG capsule, SMARTSIG:2 Capsule(s) By Mouth Daily PRN, Disp: , Rfl:    amLODipine  (NORVASC ) 10 MG tablet, Take 1 tablet (10 mg total) by mouth daily., Disp: 90 tablet, Rfl: 1   atorvastatin  (LIPITOR) 40 MG tablet, Take 1 tablet (40 mg total) by mouth daily. Stop simvastatin , Disp: 90 tablet, Rfl: 1   bisoprolol  (ZEBETA ) 10 MG tablet, Take 1 tablet (10 mg total) by mouth daily., Disp: 90 tablet, Rfl: 0   cyclobenzaprine  (FLEXERIL ) 5 MG tablet, Take 1 tablet (5 mg total) by mouth 2 (two) times daily as needed for muscle spasms., Disp: 30 tablet, Rfl: 0   ferrous sulfate  325 (65 FE) MG tablet, Take 325 mg by mouth in the morning., Disp: , Rfl:    fluticasone  (FLONASE ) 50 MCG/ACT nasal spray, Place 2 sprays into both nostrils daily., Disp: 16 g, Rfl: 0   gabapentin  (NEURONTIN ) 300 MG capsule, Take 1 capsule (300 mg total) by mouth at bedtime., Disp: 90 capsule, Rfl: 1   glipiZIDE  (GLUCOTROL ) 10 MG tablet, Take 1.5 tablets (15 mg total) by mouth 2 (two)  times daily before a meal., Disp: 270 tablet, Rfl: 2   hydrALAZINE  (APRESOLINE ) 50 MG tablet, TAKE 1.5 TABLETS BY MOUTH 3 TIMES A DAY, Disp: 405 tablet, Rfl: 1   hydrochlorothiazide  (HYDRODIURIL ) 25 MG tablet, Take 1 tablet (25 mg total) by mouth daily., Disp: 90 tablet, Rfl: 1   hydrOXYzine  (ATARAX ) 10 MG tablet, Take 1 tablet (10 mg total) by mouth 3 (three) times daily as needed for anxiety., Disp: 90 tablet, Rfl: 2   insulin  glargine (LANTUS  SOLOSTAR) 100 UNIT/ML Solostar Pen, Inject 6 Units into the skin daily., Disp: 15 mL, Rfl: PRN   Insulin  Pen Needle (PEN NEEDLES) 31G X 8 MM MISC, use as directed, Disp: 100 each, Rfl: 6   meloxicam  (MOBIC ) 15 MG tablet, Take 1 tablet (15 mg total) by mouth daily as needed., Disp: 30 tablet, Rfl: 2   metFORMIN  (GLUCOPHAGE -XR) 500 MG 24 hr tablet, Take 2 tablets (1,000 mg total) by mouth daily with breakfast., Disp: 180 tablet, Rfl: 1   olmesartan  (BENICAR ) 40 MG tablet, Take 1 tablet (40 mg total) by mouth daily., Disp: 90 tablet, Rfl: 3   ondansetron  (ZOFRAN -ODT) 4 MG disintegrating tablet, Take 1 tablet (4 mg total) by mouth every 8 (eight) hours as needed for nausea or vomiting., Disp: 20 tablet, Rfl: 0   QUEtiapine  (SEROQUEL ) 50 MG tablet, Take 1 tablet (50 mg total) by mouth at bedtime., Disp: 30 tablet, Rfl: 1   sertraline  (  ZOLOFT ) 100 MG tablet, TAKE 2 TABLETS BY MOUTH EVERY DAY, Disp: 180 tablet, Rfl: 0   triamcinolone  ointment (KENALOG ) 0.5 %, Apply 1 Application topically 2 (two) times daily., Disp: 30 g, Rfl: 0   Medications ordered in this encounter:  Meds ordered this encounter  Medications   loperamide (IMODIUM) 2 MG capsule    Sig: Take 1 capsule (2 mg total) by mouth as needed for up to 7 days for diarrhea or loose stools (Take 2 capsule x 1 dose then 1 capsule after each loose stool as needed).    Dispense:  8 capsule    Refill:  0     *If you need refills on other medications prior to your next appointment, please contact your  pharmacy*  Follow-Up: Call back or seek an in-person evaluation if the symptoms worsen or if the condition fails to improve as anticipated.  [facility] [phone]  Other Instructions Diarrhea, Adult Diarrhea is when you pass loose and sometimes watery poop (stool) often. Diarrhea can make you feel weak and cause you to lose water  in your body (get dehydrated). Losing water  in your body can cause you to: Feel tired and thirsty. Have a dry mouth. Go pee (urinate) less often. Diarrhea often lasts 2-3 days. It can last longer if it is a sign of something more serious. Be sure to treat your diarrhea as told by your doctor. Follow these instructions at home: Eating and drinking     Follow these instructions as told by your doctor: Take an ORS (oral rehydration solution). This is a drink that helps you replace fluids and minerals your body lost. It is sold at pharmacies and stores. Drink enough fluid to keep your pee (urine) pale yellow. Drink fluids such as: Water . You can also get fluids by sucking on ice chips. Diluted fruit juice. Low-calorie sports drinks. Milk. Avoid drinking fluids that have a lot of sugar or caffeine in them. These include soda, energy drinks, and regular sports drinks. Avoid alcohol. Eat bland, easy-to-digest foods in small amounts as you are able. These foods include: Bananas. Applesauce. Rice. Low-fat (lean) meats. Toast. Crackers. Avoid spicy or fatty foods.  Medicines Take over-the-counter and prescription medicines only as told by your doctor. If you were prescribed antibiotics, take them as told by your doctor. Do not stop taking them even if you start to feel better. General instructions  Wash your hands often using soap and water  for 20 seconds. If soap and water  are not available, use hand sanitizer. Others in your home should wash their hands as well. Wash your hands: After using the toilet or changing a diaper. Before  preparing, cooking, or serving food. While caring for a sick person. While visiting someone in a hospital. Rest at home while you get better. Take a warm bath to help with any burning or pain from having diarrhea. Watch your condition for any changes. Contact a doctor if: You have a fever. Your diarrhea gets worse. You have new symptoms. You vomit every time you eat or drink. You feel light-headed, dizzy, or you have a headache. You have muscle cramps. You have signs of losing too much water  in your body, such as: Dark pee, very little pee, or no pee. Cracked lips. Dry mouth. Sunken eyes. Sleepiness. Weakness. You have bloody or black poop or poop that looks like tar. You have very bad pain, cramping, or bloating in your belly (abdomen). Your skin feels cold and clammy. You feel  confused. Get help right away if: You have chest pain. Your heart is beating very quickly. You have trouble breathing or you are breathing very quickly. You feel very weak or you faint. These symptoms may be an emergency. Get help right away. Call 911. Do not wait to see if the symptoms will go away. Do not drive yourself to the hospital. This information is not intended to replace advice given to you by your health care provider. Make sure you discuss any questions you have with your health care provider. Document Revised: 08/23/2021 Document Reviewed: 08/23/2021 Elsevier Patient Education  2024 [employer]vier [employer]   If you have been instructed to have an in-person evaluation today at a local Urgent Care facility, please use the link below. It will take you to a list of all of our available [facility], including address, phone number and hours of operation. Please do not delay care.  [facility]  If you or a family member do not have a primary care provider, use the link below to schedule a visit and establish care. When you choose a [facility] primary care physician or advanced  practice provider, you gain a long-term partner in health. Find a Primary Care Provider  Learn more about [facility]'s in-office and virtual care options: [facility] - Get Care Now

## 2023-12-31 ENCOUNTER — Ambulatory Visit (INDEPENDENT_AMBULATORY_CARE_PROVIDER_SITE_OTHER)

## 2023-12-31 ENCOUNTER — Ambulatory Visit (HOSPITAL_COMMUNITY)
Admission: EM | Admit: 2023-12-31 | Discharge: 2023-12-31 | Disposition: A | Discharge status: Home / Self Care | Attending: Family Medicine | Admitting: Family Medicine

## 2023-12-31 ENCOUNTER — Encounter (HOSPITAL_COMMUNITY): Payer: Self-pay | Admitting: *Deleted

## 2023-12-31 DIAGNOSIS — R0789 Other chest pain: Secondary | ICD-10-CM | POA: Diagnosis not present

## 2023-12-31 DIAGNOSIS — J019 Acute sinusitis, unspecified: Secondary | ICD-10-CM | POA: Diagnosis not present

## 2023-12-31 MED ORDER — PREDNISONE 20 MG PO TABS: 40.0000 mg | ORAL_TABLET | Freq: Every day | ORAL | 0 refills | Status: AC

## 2023-12-31 MED ORDER — DOXYCYCLINE HYCLATE 100 MG PO CAPS: 100.0000 mg | ORAL_CAPSULE | Freq: Two times a day (BID) | ORAL | 0 refills | Status: AC

## 2023-12-31 MED ORDER — ALBUTEROL SULFATE HFA 108 (90 BASE) MCG/ACT IN AERS: 2.0000 | INHALATION_SPRAY | RESPIRATORY_TRACT | 0 refills | Status: AC | PRN

## 2023-12-31 NOTE — ED Triage Notes (Signed)
 Pt states she has some SOB, chest tightness, sore throat, headache, rash X 2 week. She has used saline rinses, allergy meds. No pain currently in office just feels like she isn't breathing well.

## 2023-12-31 NOTE — Discharge Instructions (Addendum)
 Chest x-ray is negative by my review. The radiologist will also read your x-ray, and if their interpretation differs significantly from mine, and the management of your condition would change, we will call you.  Take doxycycline  100 mg --1 capsule 2 times daily for 7 days  Take prednisone  20 mg--2 daily for 3 days  Albuterol inhaler--do 2 puffs every 4 hours as needed for shortness of breath or wheezing  If you do not improve or if you worsen in any way, please go to the emergency room for further evaluation

## 2023-12-31 NOTE — ED Provider Notes (Signed)
 [facility]    CSN: [id number] Arrival date & time: 12/31/23  1446      History   Chief Complaint Chief Complaint  Patient presents with   Nasal Congestion   Headache   Shortness of Breath   Rash    HPI Crystal Vasquez is a 53 y.o. female.    Headache Shortness of Breath Associated symptoms: headaches and rash   Rash Associated symptoms: headaches and shortness of breath    Here for shortness of breath and chest tightness that has been going on for about 2 weeks.  Initially for the first week or so she had a little bit of rhinorrhea and a lot of postnasal drainage.  She is now having some sinus pressure and headache.  She also notes some sore throat at times.  Not much cough  She has a history of acid reflux induced asthma.  She has a history of diabetes that is not well-controlled, and last A1c was 8.5  No dysuria  Past Medical History:  Diagnosis Date   Anemia    Asthma    due to acid reflux   Depression    DM2    Dyspnea    GERD (gastroesophageal reflux disease)    Headache    Heart murmur    benign   Hypertension    Obesity (BMI 30-39.9)    Thyroid  disease     Patient Active Problem List   Diagnosis Date Noted   GAD (generalized anxiety disorder) 08/02/2023   MDD (major depressive disorder), recurrent episode, moderate (HCC) 08/02/2023   Binge eating 08/02/2023   Subclinical hyperthyroidism 02/20/2022   Hyperlipidemia associated with type 2 diabetes mellitus (HCC) 01/24/2022   Excessive daytime sleepiness 10/31/2021   Snoring 10/31/2021   Fever postop 05/19/2021   Uterine polyp 05/09/2021   Hypertension associated with type 2 diabetes mellitus (HCC) 12/03/2020   Uncontrolled type 2 diabetes mellitus with hyperglycemia (HCC) 12/03/2020   Uterine mass 12/03/2020   Abnormal uterine bleeding (AUB) 11/11/2020   History of uterine fibroid 11/11/2020   Iron deficiency anemia due to chronic blood loss 11/11/2020   History of  menorrhagia 11/11/2020    Past Surgical History:  Procedure Laterality Date   ABDOMINAL HYSTERECTOMY     BIOPSY THYROID      IR ANGIOGRAM PELVIS SELECTIVE OR SUPRASELECTIVE  05/09/2021   IR ANGIOGRAM SELECTIVE EACH ADDITIONAL VESSEL  05/09/2021   IR AORTAGRAM ABDOMINAL SERIALOGRAM  05/09/2021   IR EMBO TUMOR ORGAN ISCHEMIA INFARCT INC GUIDE ROADMAPPING  05/09/2021   IR RADIOLOGIST EVAL & MGMT  04/11/2021   IR US  GUIDE VASC ACCESS LEFT  05/09/2021   ROBOTIC ASSISTED LAPAROSCOPIC HYSTERECTOMY AND SALPINGECTOMY Bilateral 05/18/2021   Procedure: XI ROBOTIC ASSISTED LAPAROSCOPIC HYSTERECTOMY AND SALPINGECTOMY, UTERUS MORE THAN 250 GRAMS;  Surgeon: Viktoria Comer SAUNDERS, MD;  Location: [facility];  Service: Gynecology;  Laterality: Bilateral;    OB History     Gravida  3   Para  0   Term  0   Preterm  0   AB  3   Living  0      SAB  1   IAB  2   Ectopic  0   Multiple  0   Live Births  0            Home Medications    Prior to Admission medications   Medication Sig Start Date End Date Taking? Authorizing Provider  albuterol (VENTOLIN HFA) 108 (90 Base) MCG/ACT inhaler Inhale  2 puffs into the lungs every 4 (four) hours as needed for wheezing or shortness of breath. 12/31/23  Yes Vonna Sharlet POUR, MD  amLODipine  (NORVASC ) 10 MG tablet Take 1 tablet (10 mg total) by mouth daily. 11/01/23  Yes Vicci Barnie NOVAK, MD  atorvastatin  (LIPITOR) 40 MG tablet Take 1 tablet (40 mg total) by mouth daily. Stop simvastatin  05/28/23  Yes Vicci Barnie NOVAK, MD  bisoprolol  (ZEBETA ) 10 MG tablet Take 1 tablet (10 mg total) by mouth daily. 11/01/23  Yes Vicci Barnie NOVAK, MD  doxycycline  (VIBRAMYCIN ) 100 MG capsule Take 1 capsule (100 mg total) by mouth 2 (two) times daily for 7 days. 12/31/23 01/07/24 Yes BanisterSharlet POUR, MD  ferrous sulfate  325 (65 FE) MG tablet Take 325 mg by mouth in the morning.   Yes [provider]  fluticasone  (FLONASE ) 50 MCG/ACT nasal spray Place 2  sprays into both nostrils daily. 03/16/23  Yes Vonna Sharlet POUR, MD  gabapentin  (NEURONTIN ) 300 MG capsule Take 1 capsule (300 mg total) by mouth at bedtime. 11/01/23  Yes Vicci Barnie NOVAK, MD  glipiZIDE  (GLUCOTROL ) 10 MG tablet Take 1.5 tablets (15 mg total) by mouth 2 (two) times daily before a meal. 03/29/23  Yes McClung, Angela M, PA-C  hydrALAZINE  (APRESOLINE ) 50 MG tablet TAKE 1.5 TABLETS BY MOUTH 3 TIMES A DAY 10/19/23  Yes Vicci Barnie NOVAK, MD  hydrochlorothiazide  (HYDRODIURIL ) 25 MG tablet Take 1 tablet (25 mg total) by mouth daily. 11/08/23  Yes Vicci Barnie NOVAK, MD  insulin  glargine (LANTUS  SOLOSTAR) 100 UNIT/ML Solostar Pen Inject 6 Units into the skin daily. 05/17/23  Yes Vicci Barnie NOVAK, MD  metFORMIN  (GLUCOPHAGE -XR) 500 MG 24 hr tablet Take 2 tablets (1,000 mg total) by mouth daily with breakfast. 11/01/23  Yes Vicci Barnie NOVAK, MD  olmesartan  (BENICAR ) 40 MG tablet Take 1 tablet (40 mg total) by mouth daily. 11/01/23  Yes Vicci Barnie NOVAK, MD  predniSONE  (DELTASONE ) 20 MG tablet Take 2 tablets (40 mg total) by mouth daily with breakfast for 3 days. 12/31/23 01/03/24 Yes Vonna Sharlet POUR, MD  QUEtiapine  (SEROQUEL ) 50 MG tablet Take 1 tablet (50 mg total) by mouth at bedtime. 11/01/23  Yes Vicci Barnie NOVAK, MD  sertraline  (ZOLOFT ) 100 MG tablet TAKE 2 TABLETS BY MOUTH EVERY DAY 11/26/23  Yes Vicci Barnie NOVAK, MD  ACETAMINOPHEN  EXTRA STRENGTH 500 MG capsule SMARTSIG:2 Capsule(s) By Mouth Daily PRN 09/30/21   [provider]  cyclobenzaprine  (FLEXERIL ) 5 MG tablet Take 1 tablet (5 mg total) by mouth 2 (two) times daily as needed for muscle spasms. 03/29/23   Danton Jon HERO, PA-C  hydrOXYzine  (ATARAX ) 10 MG tablet Take 1 tablet (10 mg total) by mouth 3 (three) times daily as needed for anxiety. 06/15/23   Raliegh Marsa RAMAN, DO  Insulin  Pen Needle (PEN NEEDLES) 31G X 8 MM MISC use as directed 05/17/23   Vicci Barnie NOVAK, MD  meloxicam  (MOBIC ) 15 MG tablet Take 1 tablet  (15 mg total) by mouth daily as needed. 03/29/23   Danton Jon HERO, PA-C  ondansetron  (ZOFRAN -ODT) 4 MG disintegrating tablet Take 1 tablet (4 mg total) by mouth every 8 (eight) hours as needed for nausea or vomiting. 05/02/23   Mayers, Cari S, PA-C  triamcinolone  ointment (KENALOG ) 0.5 % Apply 1 Application topically 2 (two) times daily. 12/06/23   Lavell Bari LABOR, FNP    Family History Family History  Problem Relation Age of Onset   Diabetes Mother    Hypertension Mother  Diabetes Father    Hypertension Father    Endometrial cancer Maternal Aunt    Colon cancer Neg Hx    Breast cancer Neg Hx    Ovarian cancer Neg Hx    Pancreatic cancer Neg Hx    Prostate cancer Neg Hx     Social History Social History   Tobacco Use   Smoking status: Never    Passive exposure: Never   Smokeless tobacco: Never  Vaping Use   Vaping status: Never Used  Substance Use Topics   Alcohol use: Not Currently   Drug use: Never     Allergies   Patient has no known allergies.   Review of Systems Review of Systems  Respiratory:  Positive for shortness of breath.   Skin:  Positive for rash.  Neurological:  Positive for headaches.     Physical Exam Triage Vital Signs ED Triage Vitals  Encounter Vitals Group     BP 12/31/23 1512 (!) 164/84     Girls Systolic BP Percentile --      Girls Diastolic BP Percentile --      Boys Systolic BP Percentile --      Boys Diastolic BP Percentile --      Pulse Rate 12/31/23 1512 (!) 58     Resp 12/31/23 1512 18     Temp 12/31/23 1512 97.9 F (36.6 C)     Temp Source 12/31/23 1512 Oral     SpO2 12/31/23 1512 97 %     Weight --      Height --      Head Circumference --      Peak Flow --      Pain Score 12/31/23 1510 0     Pain Loc --      Pain Education --      Exclude from Growth Chart --    No data found.  Updated Vital Signs BP (!) 164/84 (BP Location: Right Arm)   Pulse (!) 58   Temp 97.9 F (36.6 C) (Oral)   Resp 18   LMP  04/27/2021 (Exact Date)   SpO2 97%   Visual Acuity Right Eye Distance:   Left Eye Distance:   Bilateral Distance:    Right Eye Near:   Left Eye Near:    Bilateral Near:     Physical Exam Vitals reviewed.  Constitutional:      General: She is not in acute distress.    Appearance: She is not ill-appearing, toxic-appearing or diaphoretic.  HENT:     Right Ear: Tympanic membrane and ear canal normal.     Left Ear: Tympanic membrane and ear canal normal.     Nose: Nose normal.     Mouth/Throat:     Mouth: Mucous membranes are moist.     Pharynx: No oropharyngeal exudate or posterior oropharyngeal erythema.  Eyes:     Extraocular Movements: Extraocular movements intact.     Conjunctiva/sclera: Conjunctivae normal.     Pupils: Pupils are equal, round, and reactive to light.  Cardiovascular:     Rate and Rhythm: Normal rate and regular rhythm.     Heart sounds: No murmur heard. Pulmonary:     Effort: No respiratory distress.     Breath sounds: No stridor. No wheezing, rhonchi or rales.     Comments: Breath sounds are diminished in the left lower posterior lung field Musculoskeletal:     Cervical back: Neck supple.  Lymphadenopathy:     Cervical: No cervical adenopathy.  Skin:  Capillary Refill: Capillary refill takes less than 2 seconds.     Coloration: Skin is not jaundiced or pale.  Neurological:     General: No focal deficit present.     Mental Status: She is alert and oriented to person, place, and time.  Psychiatric:        Behavior: Behavior normal.      UC Treatments / Results  Labs (all labs ordered are listed, but only abnormal results are displayed) Labs Reviewed - No data to display  EKG   Radiology No results found.  Procedures Procedures (including critical care time)  Medications Ordered in UC Medications - No data to display  Initial Impression / Assessment and Plan / UC Course  I have reviewed the triage vital signs and the nursing  notes.  Pertinent labs & imaging results that were available during my care of the patient were reviewed by me and considered in my medical decision making (see chart for details).     Chest x-ray is negative by my review.  She is advised of radiology overread.    Doxycycline  is sent and for acute sinusitis and I have sent in 3 days of prednisone  for possible asthma exacerbation since she feels a tight in her chest and albuterol inhaler to try for her symptoms. Final Clinical Impressions(s) / UC Diagnoses   Final diagnoses:  Chest tightness  Acute non-recurrent sinusitis, unspecified location     Discharge Instructions      Chest x-ray is negative by my review. The radiologist will also read your x-ray, and if their interpretation differs significantly from mine, and the management of your condition would change, we will call you.  Take doxycycline  100 mg --1 capsule 2 times daily for 7 days  Take prednisone  20 mg--2 daily for 3 days  Albuterol inhaler--do 2 puffs every 4 hours as needed for shortness of breath or wheezing  If you do not improve or if you worsen in any way, please go to the emergency room for further evaluation      ED Prescriptions     Medication Sig Dispense Auth. Provider   doxycycline  (VIBRAMYCIN ) 100 MG capsule Take 1 capsule (100 mg total) by mouth 2 (two) times daily for 7 days. 14 capsule Daouda Lonzo K, MD   predniSONE  (DELTASONE ) 20 MG tablet Take 2 tablets (40 mg total) by mouth daily with breakfast for 3 days. 6 tablet Evelise Reine K, MD   albuterol (VENTOLIN HFA) 108 (90 Base) MCG/ACT inhaler Inhale 2 puffs into the lungs every 4 (four) hours as needed for wheezing or shortness of breath. 1 each Vonna Sharlet POUR, MD      PDMP not reviewed this encounter.   Vonna Sharlet POUR, MD 12/31/23 [phone]

## 2024-01-04 ENCOUNTER — Ambulatory Visit: Admitting: Internal Medicine

## 2024-01-04 ENCOUNTER — Encounter: Payer: Self-pay | Admitting: Internal Medicine

## 2024-01-04 VITALS — BP 120/88 | HR 53 | Ht 66.0 in | Wt 176.3 lb

## 2024-01-04 DIAGNOSIS — E119 Type 2 diabetes mellitus without complications: Secondary | ICD-10-CM | POA: Diagnosis not present

## 2024-01-04 DIAGNOSIS — E1165 Type 2 diabetes mellitus with hyperglycemia: Secondary | ICD-10-CM | POA: Diagnosis not present

## 2024-01-04 DIAGNOSIS — E042 Nontoxic multinodular goiter: Secondary | ICD-10-CM | POA: Diagnosis not present

## 2024-01-04 DIAGNOSIS — E1169 Type 2 diabetes mellitus with other specified complication: Secondary | ICD-10-CM | POA: Diagnosis not present

## 2024-01-04 DIAGNOSIS — Z794 Long term (current) use of insulin: Secondary | ICD-10-CM

## 2024-01-04 DIAGNOSIS — Z7984 Long term (current) use of oral hypoglycemic drugs: Secondary | ICD-10-CM

## 2024-01-04 LAB — POCT GLYCOSYLATED HEMOGLOBIN (HGB A1C): Hemoglobin A1C: 9.9 % — AB (ref 4.0–5.6)

## 2024-01-04 LAB — POCT GLUCOSE (DEVICE FOR HOME USE): Glucose Fasting, POC: 226 mg/dL — AB (ref 70–99)

## 2024-01-04 MED ORDER — METFORMIN HCL ER 500 MG PO TB24: 1000.0000 mg | ORAL_TABLET | Freq: Every day | ORAL | 2 refills | Status: AC

## 2024-01-04 MED ORDER — GLIPIZIDE 10 MG PO TABS: 20.0000 mg | ORAL_TABLET | Freq: Two times a day (BID) | ORAL | 2 refills | Status: AC

## 2024-01-04 MED ORDER — LANTUS SOLOSTAR 100 UNIT/ML ~~LOC~~ SOPN: 10.0000 [IU] | PEN_INJECTOR | Freq: Every day | SUBCUTANEOUS | 1 refills | Status: DC

## 2024-01-04 MED ORDER — METHIMAZOLE 5 MG PO TABS: 5.0000 mg | ORAL_TABLET | ORAL | 2 refills | Status: DC

## 2024-01-04 MED ORDER — INSULIN PEN NEEDLE 32G X 4 MM MISC: 1.0000 | Freq: Every day | 3 refills | Status: DC

## 2024-01-04 NOTE — Progress Notes (Signed)
 Name: Crystal Vasquez  MRN/ DOB: [id number], October 07, 1970   Age/ Sex: 53 y.o., female    PCP: Vicci Barnie NOVAK, MD   Reason for Endocrinology Evaluation: Type 2 Diabetes Mellitus     Date of Initial Endocrinology Visit: 02/20/2022    PATIENT IDENTIFIER: Crystal Vasquez is a 53 y.o. female with a past medical history of HTN, T2DM, history of thyroid  disease. The patient presented for initial endocrinology clinic visit on 02/20/2022 for consultative assistance with her diabetes management.    HPI: Crystal Vasquez was    Diagnosed with DM 10-15 yrs ago  Prior Medications tried/Intolerance: Glipizide  was started ~ 4 yrs ago                 Hemoglobin A1c has ranged from 6.7% in 2022, peaking at 9.0% in 2022.   She lives in her car which makes it difficult to eat healthy     THYROID  HISTORY She had an FNA ~ 4 yrs ago of the thyroid  while living in [place] Denies local neck swelling   Has Maternal grandmother and cousin and maternal aunt with  Hx of MEN   I had ordered thyroid  ultrasound 02/2022 but by her visit in 08/2022 has not been done  SUBJECTIVE:   During the last visit (08/21/2022): A1c 7.8%     Today (01/04/24): Crystal Vasquez is here for follow-up on diabetes management.She  checks her blood sugars occasionally.     She has NOT been to our clinic in 16 months.  She continues to live in her car  She presented to urgent care a few days ago with nasal congestion, headaches, and shortness of breath, she was prescribed prednisone  and doxycycline  Patient admits to being inconsistent with taking her medications, SGLT2 inhibitors have been cost prohibitive  She is on insulin  which was started through PCP , she keeps it in the fridge at work Has nausea due to GERD Has occasional constipation with diarrhea  She has palpitations  Has occasional tremors    HOME DIABETES REGIMEN: Glipizide  10 mg, 1.5 tabs twice daily Metformin  1000 mg twice daily Lantus  6  units daily      Statin: Yes ACE-I/ARB: Yes    METER DOWNLOAD SUMMARY: Did not bring      DIABETIC COMPLICATIONS: Microvascular complications:   Denies: CKD, neuropathy, retinopathy  Last eye exam: Completed 2022  Macrovascular complications:   Denies: CAD, PVD, CVA   PAST HISTORY: Past Medical History:  Past Medical History:  Diagnosis Date   Anemia    Asthma    due to acid reflux   Depression    DM2    Dyspnea    GERD (gastroesophageal reflux disease)    Headache    Heart murmur    benign   Hypertension    Obesity (BMI 30-39.9)    Thyroid  disease    Past Surgical History:  Past Surgical History:  Procedure Laterality Date   ABDOMINAL HYSTERECTOMY     BIOPSY THYROID      IR ANGIOGRAM PELVIS SELECTIVE OR SUPRASELECTIVE  05/09/2021   IR ANGIOGRAM SELECTIVE EACH ADDITIONAL VESSEL  05/09/2021   IR AORTAGRAM ABDOMINAL SERIALOGRAM  05/09/2021   IR EMBO TUMOR ORGAN ISCHEMIA INFARCT INC GUIDE ROADMAPPING  05/09/2021   IR RADIOLOGIST EVAL & MGMT  04/11/2021   IR US  GUIDE VASC ACCESS LEFT  05/09/2021   ROBOTIC ASSISTED LAPAROSCOPIC HYSTERECTOMY AND SALPINGECTOMY Bilateral 05/18/2021   Procedure: XI ROBOTIC ASSISTED LAPAROSCOPIC HYSTERECTOMY AND SALPINGECTOMY, UTERUS MORE THAN 250  GRAMS;  Surgeon: Viktoria Comer SAUNDERS, MD;  Location: [facility];  Service: Gynecology;  Laterality: Bilateral;    Social History:  reports that she has never smoked. She has never been exposed to tobacco smoke. She has never used smokeless tobacco. She reports that she does not currently use alcohol. She reports that she does not use drugs. Family History:  Family History  Problem Relation Age of Onset   Diabetes Mother    Hypertension Mother    Diabetes Father    Hypertension Father    Endometrial cancer Maternal Aunt    Colon cancer Neg Hx    Breast cancer Neg Hx    Ovarian cancer Neg Hx    Pancreatic cancer Neg Hx    Prostate cancer Neg Hx      HOME MEDICATIONS: Allergies as  of 01/04/2024   No Known Allergies      Medication List        Accurate as of January 04, 2024 10:14 AM. If you have any questions, ask your nurse or doctor.          Acetaminophen  Extra Strength 500 MG Caps SMARTSIG:2 Capsule(s) By Mouth Daily PRN   albuterol 108 (90 Base) MCG/ACT inhaler Commonly known as: VENTOLIN HFA Inhale 2 puffs into the lungs every 4 (four) hours as needed for wheezing or shortness of breath.   amLODipine  10 MG tablet Commonly known as: NORVASC  Take 1 tablet (10 mg total) by mouth daily.   atorvastatin  40 MG tablet Commonly known as: LIPITOR Take 1 tablet (40 mg total) by mouth daily. Stop simvastatin    bisoprolol  10 MG tablet Commonly known as: ZEBETA  Take 1 tablet (10 mg total) by mouth daily.   cyclobenzaprine  5 MG tablet Commonly known as: FLEXERIL  Take 1 tablet (5 mg total) by mouth 2 (two) times daily as needed for muscle spasms.   doxycycline  100 MG capsule Commonly known as: VIBRAMYCIN  Take 1 capsule (100 mg total) by mouth 2 (two) times daily for 7 days.   ferrous sulfate  325 (65 FE) MG tablet Take 325 mg by mouth in the morning.   fluticasone  50 MCG/ACT nasal spray Commonly known as: FLONASE  Place 2 sprays into both nostrils daily.   gabapentin  300 MG capsule Commonly known as: NEURONTIN  Take 1 capsule (300 mg total) by mouth at bedtime.   glipiZIDE  10 MG tablet Commonly known as: GLUCOTROL  Take 1.5 tablets (15 mg total) by mouth 2 (two) times daily before a meal.   hydrALAZINE  50 MG tablet Commonly known as: APRESOLINE  TAKE 1.5 TABLETS BY MOUTH 3 TIMES A DAY   hydrochlorothiazide  25 MG tablet Commonly known as: HYDRODIURIL  Take 1 tablet (25 mg total) by mouth daily.   hydrOXYzine  10 MG tablet Commonly known as: ATARAX  Take 1 tablet (10 mg total) by mouth 3 (three) times daily as needed for anxiety.   Lantus  SoloStar 100 UNIT/ML Solostar Pen Generic drug: insulin  glargine Inject 6 Units into the skin daily.    meloxicam  15 MG tablet Commonly known as: MOBIC  Take 1 tablet (15 mg total) by mouth daily as needed.   metFORMIN  500 MG 24 hr tablet Commonly known as: GLUCOPHAGE -XR Take 2 tablets (1,000 mg total) by mouth daily with breakfast.   olmesartan  40 MG tablet Commonly known as: BENICAR  Take 1 tablet (40 mg total) by mouth daily.   ondansetron  4 MG disintegrating tablet Commonly known as: ZOFRAN -ODT Take 1 tablet (4 mg total) by mouth every 8 (eight) hours as needed for nausea or vomiting.  Pen Needles 31G X 8 MM Misc use as directed   QUEtiapine  50 MG tablet Commonly known as: SEROquel  Take 1 tablet (50 mg total) by mouth at bedtime.   sertraline  100 MG tablet Commonly known as: ZOLOFT  TAKE 2 TABLETS BY MOUTH EVERY DAY   triamcinolone  ointment 0.5 % Commonly known as: KENALOG  Apply 1 Application topically 2 (two) times daily.         ALLERGIES: No Known Allergies   REVIEW OF SYSTEMS: A comprehensive ROS was conducted with the patient and is negative except as per HPI    OBJECTIVE:   VITAL SIGNS: BP 120/88 (BP Location: Left Arm, Patient Position: Sitting, Cuff Size: Normal)   Pulse (!) 53   Ht 5' 6 (1.676 m)   Wt 176 lb 4.8 oz (80 kg)   LMP 04/27/2021 (Exact Date)   SpO2 99%   BMI 28.46 kg/m    PHYSICAL EXAM:  General: Pt appears well and is in NAD  Neck: General: Supple without adenopathy or carotid bruits. Thyroid : Prominent thyroid  gland  Lungs: Clear with good BS bilat   Heart: RRR   Abdomen:  soft, nontender  Extremities:  Lower extremities - No pretibial edema.   Neuro: MS is good with appropriate affect, pt is alert and Ox3    DM foot exam: 01/04/2024  The skin of the feet is intact without sores or ulcerations. The pedal pulses are 2+ on right and 2+ on left. The sensation is intact to a screening 5.07, 10 gram monofilament bilaterally   DATA REVIEWED:  Lab Results  Component Value Date   HGBA1C 8.5 (A) 11/01/2023   HGBA1C 8.3 (A)  05/17/2023   HGBA1C 7.7 (A) 01/11/2023     Latest Reference Range & Units 11/01/23 12:31  TSH 0.450 - 4.500 uIU/mL 0.398 (L)  Triiodothyronine,Free,Serum 2.0 - 4.4 pg/mL 3.2  T4,Free(Direct) 0.82 - 1.77 ng/dL 8.85  (L): Data is abnormally low   ASSESSMENT / PLAN / RECOMMENDATIONS:   1) Type 2 Diabetes Mellitus, poorly controlled, Without complications - Most recent A1c of 9.9 %. Goal A1c < 7.0 %.    -Patient was social determinants, continues to live in her car, this makes it very difficult to get access to healthy food, or glucose checks or refrigerator -SGLT2 inhibitors and GLP-1 agonist are cost prohibitive -She was started on insulin  through her PCPs office, but unfortunately she kept on Lantus  and her car and it appears that it was denatured, she is unable to pick up an early prescription of Lantus  so she has not had it recently -I will increase glipizide  as below -Intolerant to higher doses of metformin , she also admits she has not been taking it on a regular basis -I will increase Lantus  as well - Discussed the importance of glucose checks - Cautioned against hypoglycemia with a combination of glipizide  and Lantus  and again the importance of glucose checks  MEDICATIONS: Increase glipizide  10 mg, 2 tablet before breakfast and 1.5 tablet before supper Continue metformin  1000 mg twice daily Increase Lantus  10 units daily  EDUCATION / INSTRUCTIONS: BG monitoring instructions: Patient is instructed to check her blood sugars 2-3 times a week. Call [facility] if: BG persistently < 70  I reviewed the Rule of 15 for the treatment of hypoglycemia in detail with the patient. Literature supplied.   2) Diabetic complications:  Eye: Does not have known diabetic retinopathy.  Neuro/ Feet: Does not have known diabetic peripheral neuropathy. Renal: Patient does not have known baseline  CKD. She is  on an ACEI/ARB at present.  3) Multinodular goiter:  -Patient is s/p  benign FNA of a thyroid  nodule approximately 4 years ago while living in [place]  - I have attempted ordering thyroid  ultrasound multiple times in the past, but she has not been able to proceed with this, patient is requesting a new order as she believes she will be able to have an ultrasound this time  4) Subclinical Hyperthyroidism:  -Possibly due to autonomous thyroid  nodule -TSH continues to be low, we have opted to treat the patient as she is having palpitations, changes in bowel movements and occasional tremors -We discussed with pt the side effects/complications of anti-thyroid  drug Tx (specifically detailing the rare, but serious side effect of agranulocytosis). As well, we discussed the possible side effects of methimazole the small chance of liver irritation/juandice and the <=1 in 300-400 chance of sudden onset agranulocytosis.  We discussed importance of going to ED promptly (and stopping methimazole) if she were to develop significant fever with severe sore throat of other evidence of acute infection.    Medication  Take methimazole 5 mg, 1 tablet Monday through Friday, none on Saturdays or Sundays  Follow-up in 3 months  Signed electronically by: Stefano Redgie Butts, MD  [facility]  [facility] [address] [place] [place], [place] [address] Phone: [phone] FAX: [phone]   CC: Vicci Barnie NOVAK, MD [address] [place] [address] Phone: [phone]  Fax: [phone]    Return to Endocrinology clinic as below: Future Appointments  Date Time Provider Department Center  03/03/2024 10:30 AM Vicci Barnie NOVAK, MD [id number]

## 2024-01-04 NOTE — Patient Instructions (Addendum)
 Take Glipizide  10 mg, 2 tablet Before Breakfast and 2 tablet before Supper  Continue Metformin  500 mg XR , 2 tablets daily  Increase Lantus  10 units daily      HOW TO TREAT LOW BLOOD SUGARS (Blood sugar LESS THAN 70 MG/DL) Please follow the RULE OF 15 for the treatment of hypoglycemia treatment (when your (blood sugars are less than 70 mg/dL)   STEP 1: Take 15 grams of carbohydrates when your blood sugar is low, which includes:  3-4 GLUCOSE TABS  OR 3-4 OZ OF JUICE OR REGULAR SODA OR ONE TUBE OF GLUCOSE GEL    STEP 2: RECHECK blood sugar in 15 MINUTES STEP 3: If your blood sugar is still low at the 15 minute recheck --> then, go back to STEP 1 and treat AGAIN with another 15 grams of carbohydrates.

## 2024-01-08 ENCOUNTER — Other Ambulatory Visit: Payer: Self-pay

## 2024-01-08 ENCOUNTER — Ambulatory Visit
Admission: RE | Admit: 2024-01-08 | Discharge: 2024-01-08 | Disposition: A | Discharge status: Home / Self Care | Source: Ambulatory Visit | Attending: Internal Medicine | Admitting: Internal Medicine

## 2024-01-08 DIAGNOSIS — E1165 Type 2 diabetes mellitus with hyperglycemia: Secondary | ICD-10-CM

## 2024-01-08 DIAGNOSIS — E042 Nontoxic multinodular goiter: Secondary | ICD-10-CM

## 2024-01-08 MED ORDER — ACCU-CHEK SOFTCLIX LANCETS MISC: 12 refills | Status: DC

## 2024-01-08 MED ORDER — ACCU-CHEK GUIDE TEST VI STRP: ORAL_STRIP | 12 refills | Status: DC

## 2024-01-08 MED ORDER — ACCU-CHEK SOFTCLIX LANCETS MISC: 12 refills | Status: AC

## 2024-01-08 MED ORDER — ACCU-CHEK GUIDE TEST VI STRP: ORAL_STRIP | 12 refills | Status: AC

## 2024-01-08 MED ORDER — ACCU-CHEK GUIDE ME W/DEVICE KIT: PACK | 0 refills | Status: AC

## 2024-01-09 ENCOUNTER — Ambulatory Visit: Payer: Self-pay | Admitting: Internal Medicine

## 2024-01-13 ENCOUNTER — Other Ambulatory Visit: Payer: Self-pay | Admitting: Internal Medicine

## 2024-02-25 ENCOUNTER — Telehealth: Payer: Self-pay

## 2024-02-25 NOTE — Telephone Encounter (Signed)
 Keep appt with me 03/03/24 and will order testing at that time.

## 2024-02-25 NOTE — Telephone Encounter (Unsigned)
 Copied from CRM #[id number]. Topic: Clinical - Request for Lab/Test Order >> Feb 25, 2024  3:50 PM Ber[place] MATSU wrote: Reason for CRM: Per patient insurance is requesting for her to get Kidney function and cholesterol test  done.   May you please assist.

## 2024-02-25 NOTE — Telephone Encounter (Signed)
 Copied from CRM #[id number]. Topic: Clinical - Request for Lab/Test Order >> Feb 25, 2024  3:50 PM [place] MATSU wrote: Reason for CRM: Per patient insurance is requesting for her to get Kidney function and cholesterol test  done.   May you please assist.

## 2024-02-26 NOTE — Telephone Encounter (Signed)
 Called but no answer. LVM informing that requested testing will be done at her upcoming appointment.

## 2024-03-03 ENCOUNTER — Ambulatory Visit: Admitting: Internal Medicine

## 2024-03-03 ENCOUNTER — Encounter: Payer: Self-pay | Admitting: Internal Medicine

## 2024-03-03 ENCOUNTER — Ambulatory Visit: Attending: Internal Medicine | Admitting: Internal Medicine

## 2024-03-03 VITALS — BP 156/81 | HR 70 | Temp 98.3°F | Ht 66.0 in | Wt 173.0 lb

## 2024-03-03 DIAGNOSIS — Z1211 Encounter for screening for malignant neoplasm of colon: Secondary | ICD-10-CM

## 2024-03-03 DIAGNOSIS — F411 Generalized anxiety disorder: Secondary | ICD-10-CM | POA: Diagnosis not present

## 2024-03-03 DIAGNOSIS — Z794 Long term (current) use of insulin: Secondary | ICD-10-CM | POA: Diagnosis not present

## 2024-03-03 DIAGNOSIS — F330 Major depressive disorder, recurrent, mild: Secondary | ICD-10-CM | POA: Diagnosis not present

## 2024-03-03 DIAGNOSIS — I10 Essential (primary) hypertension: Secondary | ICD-10-CM | POA: Diagnosis not present

## 2024-03-03 DIAGNOSIS — Z7984 Long term (current) use of oral hypoglycemic drugs: Secondary | ICD-10-CM | POA: Diagnosis not present

## 2024-03-03 DIAGNOSIS — E785 Hyperlipidemia, unspecified: Secondary | ICD-10-CM | POA: Diagnosis not present

## 2024-03-03 DIAGNOSIS — L600 Ingrowing nail: Secondary | ICD-10-CM

## 2024-03-03 DIAGNOSIS — K529 Noninfective gastroenteritis and colitis, unspecified: Secondary | ICD-10-CM | POA: Diagnosis not present

## 2024-03-03 DIAGNOSIS — L821 Other seborrheic keratosis: Secondary | ICD-10-CM | POA: Diagnosis not present

## 2024-03-03 DIAGNOSIS — B351 Tinea unguium: Secondary | ICD-10-CM

## 2024-03-03 DIAGNOSIS — Z0000 Encounter for general adult medical examination without abnormal findings: Secondary | ICD-10-CM

## 2024-03-03 DIAGNOSIS — Z2821 Immunization not carried out because of patient refusal: Secondary | ICD-10-CM

## 2024-03-03 DIAGNOSIS — E1169 Type 2 diabetes mellitus with other specified complication: Secondary | ICD-10-CM

## 2024-03-03 DIAGNOSIS — Z114 Encounter for screening for human immunodeficiency virus [HIV]: Secondary | ICD-10-CM

## 2024-03-03 DIAGNOSIS — Z1159 Encounter for screening for other viral diseases: Secondary | ICD-10-CM

## 2024-03-03 DIAGNOSIS — I152 Hypertension secondary to endocrine disorders: Secondary | ICD-10-CM

## 2024-03-03 DIAGNOSIS — E119 Type 2 diabetes mellitus without complications: Secondary | ICD-10-CM

## 2024-03-03 MED ORDER — TERBINAFINE HCL 250 MG PO TABS: 250.0000 mg | ORAL_TABLET | Freq: Every day | ORAL | 1 refills | Status: AC

## 2024-03-03 MED ORDER — BISOPROLOL FUMARATE 10 MG PO TABS: 10.0000 mg | ORAL_TABLET | Freq: Every day | ORAL | 1 refills | Status: AC

## 2024-03-03 NOTE — Patient Instructions (Addendum)
 [facility] [phone]   VISIT SUMMARY: Today, you came in for a physical exam to prevent an increase in your insurance premiums. We discussed your current health status, reviewed your medications, and planned for necessary screenings and treatments.  YOUR PLAN: -TYPE 2 DIABETES MELLITUS: Your blood sugar levels have been higher than desired, with your A1c at 9.9. We discussed the importance of taking your medications consistently and monitoring your blood sugar daily. Continue with your current medications: Lantus  insulin  10 units daily, glipizide  10 mg (1.5 tablets twice a day), and metformin  1000 mg twice a day. Setting alarms and taking medications at work may help improve adherence.  -HYPERTENSION: Your blood pressure is well-controlled at 120/70. Continue taking your current medications: amlodipine  10 mg daily, supprelin 10 mg daily, Benicar  40 mg daily, hydrochlorothiazide  25 mg daily, and hydralazine  75 mg three times a day. Monitor your blood pressure weekly, aiming for a goal of 130/80 or lower, and continue to limit salt in your diet.  -HYPERLIPIDEMIA: You are due for a cholesterol panel to check your cholesterol levels. This test will help us  understand your risk for heart disease and guide any necessary treatment.  -ONYCHOMYCOSIS OF TOENAIL: You have a toenail fungus that has not improved with over-the-counter treatments. We prescribed Lamisil  250 mg daily for three months. Before starting, we will check your liver function and recheck it after one month and two months of treatment. Watch for signs of liver issues like vomiting, right-sided abdominal pain, or yellowing of the skin.  -INGROWN TOENAIL: You have painful ingrown toenails on both big toes. We referred you to a podiatrist for further evaluation and treatment.  -IRRITABLE BOWEL SYNDROME WITH DIARRHEA: You experience intermittent diarrhea, which seems to improve with a vegan diet. We  provided information on dietary changes and food elimination testing. You may also use Imodium  for symptom relief.  -SEBORRHEIC KERATOSIS: You have a skin lesion that has grown over the years. We referred you to a dermatologist for evaluation and treatment.  -GENERALIZED ANXIETY DISORDER: You have ongoing anxiety and have had trouble with telehealth consultations. We submitted a referral for you to see a behavioral health specialist in person.  -GENERAL HEALTH MAINTENANCE: You are due for several screenings: a mammogram, colon cancer screening, and hepatitis C and HIV screening. We provided the necessary orders and kits for these tests. You declined the flu and pneumonia vaccines but plan to get your second shingles vaccine at the pharmacy. Please notify us  once you have received it.  INSTRUCTIONS: Please schedule your mammogram and complete the stool test for colon cancer screening. Get your hepatitis C and HIV screenings done as ordered. Continue monitoring your blood sugar and blood pressure regularly. Follow up with the podiatrist for your ingrown toenails and the dermatologist for your skin l esion. Notify us  via MyChart once you have received your second shingles vaccine.                      Contains text generated by Abridge.                                 Contains text generated by Abridge.

## 2024-03-03 NOTE — Progress Notes (Signed)
 Patient ID: Crystal Vasquez, female    DOB: 25-Mar-1970  MRN: [id number]  CC: Annual Exam (Physical. [facility] podiatry referral - possible toenail fungus, in-grown nail, soreness, thick skin growing at pinky/No to all vax. Yes to colonoscopy. )   Subjective: Crystal Vasquez is a 53 y.o. female who presents for chronic ds management. Her concerns today include:  Patient with history of HTN, DM type II, HL, MDD/GAD/PTSD/possible ADHD followed by psychiatry, Thyroid  ds   Discussed the use of AI scribe software for clinical note transcription with the patient, who gave verbal consent to proceed.  History of Present Illness Crystal Vasquez is a 53 year old female who presents for a physical exam to prevent insurance premium increase.  She requires a physical exam for her work to prevent an increase in her insurance premiums. She needs to have her kidney function and cholesterol levels checked.  HM: She is due for a mammogram, which was ordered in August, but she has not been able to schedule it. She completed her diabetic eye exam a couple of weeks ago at [facility]. She is also due for colon cancer screening and prefers a stool test over a colonoscopy. She has no known family history of colon cancer and has had a previous colonoscopy that showed benign polyps. 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  She reports a toenail fungus under her toenails on both feet for about six months, which she has been managing with over-the-counter treatments from [facility]. The condition improves with treatment but recurs due to her feet getting very sweaty.  HTN: She is on multiple medications for blood pressure, including amlodipine  10 mg daily, supprelin 10 mg daily, Benicar  40 mg daily, hydrochlorothiazide  25 mg daily, and hydralazine  75 mg three  times a day. She reports taking them more consistently and has measured her blood pressure at 120/70 seveal wks ago. Did not take meds as yet for today..  Chronic Diarrhea: She has a history of irritable bowel syndrome with diarrhea, which she associates with eating meat. She has been trying a more vegan diet, which seems to reduce the intensity of diarrhea. She has had diarrhea on and off for about six years. No blood in the stools.  DM: Results for orders placed or performed in visit on 01/04/24  POCT Glucose (Device for Home Use)   Collection Time: 01/04/24 10:14 AM  Result Value Ref Range   Glucose Fasting, POC 226 (A) 70 - 99 mg/dL   POC Glucose    POCT glycosylated hemoglobin (Hb A1C)   Collection Time: 01/04/24 10:37 AM  Result Value Ref Range   Hemoglobin A1C 9.9 (A) 4.0 - 5.6 %   HbA1c POC (<> result, manual entry)     HbA1c, POC (prediabetic range)     HbA1c, POC (controlled diabetic range)    Her diabetes management includes Lantus  insulin  10 units daily, glipizide  10 mg one and a half tablets twice a day, and metformin  1000 mg twice a day. She reports taking her medications more consistently but still forgets two to four times a week. Her last A1c in October was 9.9, up from 8.5 in August.  She has a broken molar in the left upper jaw and plans to see a dentist.   She also reports having a skin lesion on RT side  of her face that has increased in size over the years, which she plans to have evaluated by a dermatologist.  MDD/GAD/PTSD: She has been trying to connect with a behavioral health specialist but faced technical difficulties during a video visit. She continues to see a counselor and plans to reschedule with a psychiatrist.    Patient Active Problem List   Diagnosis Date Noted   GAD (generalized anxiety disorder) 08/02/2023   MDD (major depressive disorder), recurrent episode, moderate (HCC) 08/02/2023   Binge eating 08/02/2023   Subclinical hyperthyroidism  02/20/2022   Hyperlipidemia associated with type 2 diabetes mellitus (HCC) 01/24/2022   Excessive daytime sleepiness 10/31/2021   Snoring 10/31/2021   Fever postop 05/19/2021   Uterine polyp 05/09/2021   Hypertension associated with type 2 diabetes mellitus (HCC) 12/03/2020   Uncontrolled type 2 diabetes mellitus with hyperglycemia (HCC) 12/03/2020   Uterine mass 12/03/2020   Abnormal uterine bleeding (AUB) 11/11/2020   History of uterine fibroid 11/11/2020   Iron deficiency anemia due to chronic blood loss 11/11/2020   History of menorrhagia 11/11/2020     Medications Ordered Prior to Encounter[1]  Allergies[2]  Social History   Socioeconomic History   Marital status: Single    Spouse name: Not on file   Number of children: Not on file   Years of education: Not on file   Highest education level: Associate degree: occupational, scientist, product/process development, or vocational program  Occupational History   Not on file  Tobacco Use   Smoking status: Never    Passive exposure: Never   Smokeless tobacco: Never  Vaping Use   Vaping status: Never Used  Substance and Sexual Activity   Alcohol use: Not Currently   Drug use: Never   Sexual activity: Not Currently  Other Topics Concern   Not on file  Social History Narrative   Not on file   Social Drivers of Health   Tobacco Use: Low Risk (01/04/2024)   Patient History    Smoking Tobacco Use: Never    Smokeless Tobacco Use: Never    Passive Exposure: Never  Financial Resource Strain: Medium Risk (03/02/2024)   Overall Financial Resource Strain (CARDIA)    Difficulty of Paying Living Expenses: Somewhat hard  Food Insecurity: Food Insecurity Present (03/02/2024)   Epic    Worried About Programme Researcher, Broadcasting/film/video in the Last Year: Often true    [employer] of Food in the Last Year: Sometimes true  Transportation Needs: No Transportation Needs (03/02/2024)   Epic    Lack of Transportation (Medical): No    Lack of Transportation (Non-Medical): No   Physical Activity: Inactive (03/02/2024)   Exercise Vital Sign    Days of Exercise per Week: 0 days    Minutes of Exercise per Session: Not on file  Stress: Stress Concern Present (03/02/2024)   [employer] of Occupational Health - Occupational Stress Questionnaire    Feeling of Stress: To some extent  Social Connections: Socially Isolated (03/02/2024)   Social Connection and Isolation Panel    Frequency of Communication with Friends and Family: More than three times a week    Frequency of Social Gatherings with Friends and Family: Once a week    Attends Religious Services: Patient declined    Database Administrator or Organizations: No    Attends Banker Meetings: Not on file    Marital Status: Never married  Intimate Partner Violence: Not At Risk (08/02/2023)   Humiliation, Afraid, Rape, and Kick questionnaire  Fear of Current or Ex-Partner: No    Emotionally Abused: No    Physically Abused: No    Sexually Abused: No  Depression (PHQ2-9): Medium Risk (08/02/2023)   Depression (PHQ2-9)    PHQ-2 Score: 9  Alcohol Screen: Low Risk (03/02/2024)   Alcohol Screen    Last Alcohol Screening Score (AUDIT): 0  Housing: High Risk (03/02/2024)   Epic    Unable to Pay for Housing in the Last Year: Patient declined    Number of Times Moved in the Last Year: Not on file    Homeless in the Last Year: Yes  Utilities: Not At Risk (08/02/2023)   [employer] Utilities    Threatened with loss of utilities: No  Health Literacy: Adequate Health Literacy (08/02/2023)   B1300 Health Literacy    Frequency of need for help with medical instructions: Never    Family History  Problem Relation Age of Onset   Diabetes Mother    Hypertension Mother    Diabetes Father    Hypertension Father    Endometrial cancer Maternal Aunt    Colon cancer Neg Hx    Breast cancer Neg Hx    Ovarian cancer Neg Hx    Pancreatic cancer Neg Hx    Prostate cancer Neg Hx     Past Surgical History:   Procedure Laterality Date   ABDOMINAL HYSTERECTOMY     BIOPSY THYROID      IR ANGIOGRAM PELVIS SELECTIVE OR SUPRASELECTIVE  05/09/2021   IR ANGIOGRAM SELECTIVE EACH ADDITIONAL VESSEL  05/09/2021   IR AORTAGRAM ABDOMINAL SERIALOGRAM  05/09/2021   IR EMBO TUMOR ORGAN ISCHEMIA INFARCT INC GUIDE ROADMAPPING  05/09/2021   IR RADIOLOGIST EVAL & MGMT  04/11/2021   IR US  GUIDE VASC ACCESS LEFT  05/09/2021   ROBOTIC ASSISTED LAPAROSCOPIC HYSTERECTOMY AND SALPINGECTOMY Bilateral 05/18/2021   Procedure: XI ROBOTIC ASSISTED LAPAROSCOPIC HYSTERECTOMY AND SALPINGECTOMY, UTERUS MORE THAN 250 GRAMS;  Surgeon: Viktoria Comer SAUNDERS, MD;  Location: [facility];  Service: Gynecology;  Laterality: Bilateral;    ROS: Review of Systems Negative except as stated above  PHYSICAL EXAM: BP (!) 156/81 (BP Location: Left Arm, Patient Position: Sitting, Cuff Size: Normal)   Pulse 70   Temp 98.3 F (36.8 C) (Oral)   Ht 5' 6 (1.676 m)   Wt 173 lb (78.5 kg)   LMP 04/27/2021   SpO2 99%   BMI 27.92 kg/m   Physical Exam  General appearance - alert, well appearing, and in no distress Mental status - normal mood, behavior, speech, dress, motor activity, and thought processes Eyes - pupils equal and reactive, extraocular eye movements intact Ears - bilateral TM's and external ear canals normal Nose - normal and patent, no erythema, discharge or polyps Mouth - mucous membranes moist, pharynx normal without lesions. Decayed 2nd molar in LT upper jaw is broken off in gum Neck - supple, no significant adenopathy Lymphatics - no palpable lymphadenopathy, no hepatosplenomegaly Chest - clear to auscultation, no wheezes, rales or rhonchi, symmetric air entry Heart - normal rate, regular rhythm, normal S1, S2, no murmurs, rubs, clicks or gallops Abdomen - soft, nontender, nondistended, no masses or organomegaly Extremities - peripheral pulses normal, no pedal edema, no clubbing or cyanosis Skin - about 4-5 cm hyperpigmented  multicolored raise keratotic lesion RT side of face anterior to RT ear Nails: nails are discolored and thick at tips. Toenail on RT big toe ingrown medial edge and lateral edge on LT big toe      Latest Ref  Rng & Units 05/17/2023   11:27 AM 10/27/2021    9:56 AM 05/23/2021    4:09 AM  CMP  Glucose 70 - 99 mg/dL 81  821  863   BUN 6 - 24 mg/dL 12  16  10    Creatinine 0.57 - 1.00 mg/dL 9.36  9.27  9.45   Sodium 134 - 144 mmol/L 143  139  137   Potassium 3.5 - 5.2 mmol/L 4.3  3.3  4.4   Chloride 96 - 106 mmol/L 104  101  104   CO2 20 - 29 mmol/L 22  31  22    Calcium  8.7 - 10.2 mg/dL 9.9  9.5  9.0   Total Protein 6.0 - 8.5 g/dL 7.8  7.7    Total Bilirubin 0.0 - 1.2 mg/dL 0.3  0.2    Alkaline Phos 44 - 121 IU/L 87  60    AST 0 - 40 IU/L 18  16    ALT 0 - 32 IU/L 19  19     Lipid Panel     Component Value Date/Time   CHOL 253 (H) 05/17/2023 1127   TRIG 225 (H) 05/17/2023 1127   HDL 62 05/17/2023 1127   CHOLHDL 4.1 05/17/2023 1127   LDLCALC 150 (H) 05/17/2023 1127    CBC    Component Value Date/Time   WBC 5.0 05/17/2023 1127   WBC 4.5 10/27/2021 0956   WBC 5.4 05/23/2021 0409   RBC 4.98 05/17/2023 1127   RBC 4.28 10/27/2021 0956   RBC 4.27 10/27/2021 0955   HGB 14.8 05/17/2023 1127   HCT 44.5 05/17/2023 1127   PLT 233 05/17/2023 1127   MCV 89 05/17/2023 1127   MCH 29.7 05/17/2023 1127   MCH 30.6 10/27/2021 0956   MCHC 33.3 05/17/2023 1127   MCHC 34.7 10/27/2021 0956   RDW 12.9 05/17/2023 1127   LYMPHSABS 1.7 10/27/2021 0956   LYMPHSABS 1.9 11/11/2020 1545   MONOABS 0.4 10/27/2021 0956   EOSABS 0.1 10/27/2021 0956   EOSABS 0.1 11/11/2020 1545   BASOSABS 0.0 10/27/2021 0956   BASOSABS 0.0 11/11/2020 1545    ASSESSMENT AND PLAN: 1. Annual physical exam (Primary) Encouraged her to continue trying to eat healthy. Recommend getting in with a dentist for routine cleaning and to have decayed tooth extracted   2. Type 2 diabetes mellitus with other specified  complication, with long-term current use of insulin  (HCC) Most recent A1c not at goal.  Due to inconsistency with taking her medications.  Continue to encourage her to take her medications consistently and set reminders on her phone to do so. - Continue Lantus  insulin  10 units daily. - Continue glipizide  10 mg, 1.5 tablets twice a day. - Continue metformin  1000 mg twice a day. - Encouraged daily blood sugar monitoring. - Microalbumin / creatinine urine ratio - CBC with Differential/Platelet - Comprehensive metabolic panel with GFR - Lipid panel  3. Diabetes mellitus treated with oral medication (HCC) See #2 above  4. Hypertension associated with type 2 diabetes mellitus (HCC) Better med adherence encouraged. - Continue current antihypertensive medications: amlodipine  10 mg daily, Busoprolol 10 mg daily, Benicar  40 mg daily, hydrochlorothiazide  25 mg daily, hydralazine  75 mg three times a day. - Encouraged weekly blood pressure monitoring with a goal of 130/80 or lower. - Advised continued dietary salt restriction. - bisoprolol  (ZEBETA ) 10 MG tablet; Take 1 tablet (10 mg total) by mouth daily.  Dispense: 90 tablet; Refill: 1  5. Hyperlipidemia associated with type 2 diabetes  mellitus (HCC) Continue atorvastatin  40 mg daily  6. Onychomycosis of toenail Discussed oral treatment with Lamisil  versus topical treatment with something like Penlac.  She is interested in oral treatment.  I went over potential side effects of Lamisil  including drug-induced hepatitis.  Advised to stop the medicine if she develops any pain in the upper abdomen, nausea/vomiting or jaundice.  We plan to check baseline LFTs today.  Plan would be to treat for 3 months.  After she is finished the first month of Lamisil , she should return to the lab again for repeat LFTs. - terbinafine  (LAMISIL ) 250 MG tablet; Take 1 tablet (250 mg total) by mouth daily.  Dispense: 30 tablet; Refill: 1 - Ambulatory referral to Podiatry  7.  Ingrown toenail - Ambulatory referral to Podiatry  8. Generalized anxiety disorder - Ambulatory referral to Psychiatry  9. Major depressive disorder, recurrent episode, mild - Ambulatory referral to Psychiatry  10. Chronic diarrhea Patient does not feel symptoms are related to metformin .  She feels more related to irritable bowel syndrome.  Discussed dietary changes that may help.  Use Imodium  over-the-counter as needed.  11. Seborrheic keratoses - Ambulatory referral to Dermatology  12. Need for hepatitis C screening test - Hepatitis C Antibody  13. Screening for HIV (human immunodeficiency virus) - HIV antibody (with reflex)  14. Influenza vaccination declined 15. Pneumococcal vaccination declined Both  recommended. Pt declined  16. Colon cancer screening - Fecal occult blood, imunochemical([facility])  Provided with phone number for the breast center so that she can call and reschedule her mammogram.  Patient was given the opportunity to ask questions.  Patient verbalized understanding of the plan and was able to repeat key elements of the plan.   This documentation was completed using Paediatric nurse.  Any transcriptional errors are unintentional.  No orders of the defined types were placed in this encounter.    Requested Prescriptions   Pending Prescriptions Disp Refills   bisoprolol  (ZEBETA ) 10 MG tablet 90 tablet 1    Sig: Take 1 tablet (10 mg total) by mouth daily.   hydrOXYzine  (ATARAX ) 10 MG tablet 90 tablet 2    Sig: Take 1 tablet (10 mg total) by mouth 3 (three) times daily as needed for anxiety.    No follow-ups on file.  Barnie Louder, MD, FACP     [1]  Current Outpatient Medications on File Prior to Visit  Medication Sig Dispense Refill   Accu-Chek Softclix Lancets lancets Check blood sugar one time daily 100 each 12   ACETAMINOPHEN  EXTRA STRENGTH 500 MG capsule SMARTSIG:2 Capsule(s) By Mouth Daily PRN     albuterol   (VENTOLIN  HFA) 108 (90 Base) MCG/ACT inhaler Inhale 2 puffs into the lungs every 4 (four) hours as needed for wheezing or shortness of breath. 1 each 0   amLODipine  (NORVASC ) 10 MG tablet Take 1 tablet (10 mg total) by mouth daily. 90 tablet 1   atorvastatin  (LIPITOR) 40 MG tablet TAKE 1 TABLET BY MOUTH EVERY DAY 90 tablet 1   bisoprolol  (ZEBETA ) 10 MG tablet Take 1 tablet (10 mg total) by mouth daily. 90 tablet 0   Blood Glucose Monitoring Suppl (ACCU-CHEK GUIDE ME) w/Device KIT Check blood sugar one time daily 1 kit 0   cyclobenzaprine  (FLEXERIL ) 5 MG tablet Take 1 tablet (5 mg total) by mouth 2 (two) times daily as needed for muscle spasms. 30 tablet 0   ferrous sulfate  325 (65 FE) MG tablet Take 325 mg by mouth in the morning.  fluticasone  (FLONASE ) 50 MCG/ACT nasal spray Place 2 sprays into both nostrils daily. 16 g 0   gabapentin  (NEURONTIN ) 300 MG capsule Take 1 capsule (300 mg total) by mouth at bedtime. 90 capsule 1   glipiZIDE  (GLUCOTROL ) 10 MG tablet Take 2 tablets (20 mg total) by mouth 2 (two) times daily before a meal. 360 tablet 2   glucose blood (ACCU-CHEK GUIDE TEST) test strip Check blood sugar one time daily 100 each 12   hydrALAZINE  (APRESOLINE ) 50 MG tablet TAKE 1.5 TABLETS BY MOUTH 3 TIMES A DAY 405 tablet 1   hydrochlorothiazide  (HYDRODIURIL ) 25 MG tablet Take 1 tablet (25 mg total) by mouth daily. 90 tablet 1   hydrOXYzine  (ATARAX ) 10 MG tablet Take 1 tablet (10 mg total) by mouth 3 (three) times daily as needed for anxiety. 90 tablet 2   insulin  glargine (LANTUS  SOLOSTAR) 100 UNIT/ML Solostar Pen Inject 10 Units into the skin daily. 15 mL 1   Insulin  Pen Needle 32G X 4 MM MISC 1 Device by Does not apply route daily in the afternoon. 100 each 3   meloxicam  (MOBIC ) 15 MG tablet Take 1 tablet (15 mg total) by mouth daily as needed. 30 tablet 2   metFORMIN  (GLUCOPHAGE -XR) 500 MG 24 hr tablet Take 2 tablets (1,000 mg total) by mouth daily with breakfast. 180 tablet 2    methimazole  (TAPAZOLE ) 5 MG tablet Take 1 tablet (5 mg total) by mouth as directed. 1 tablet 5 days a week 60 tablet 2   olmesartan  (BENICAR ) 40 MG tablet Take 1 tablet (40 mg total) by mouth daily. 90 tablet 3   ondansetron  (ZOFRAN -ODT) 4 MG disintegrating tablet Take 1 tablet (4 mg total) by mouth every 8 (eight) hours as needed for nausea or vomiting. 20 tablet 0   QUEtiapine  (SEROQUEL ) 50 MG tablet Take 1 tablet (50 mg total) by mouth at bedtime. 30 tablet 1   sertraline  (ZOLOFT ) 100 MG tablet TAKE 2 TABLETS BY MOUTH EVERY DAY 180 tablet 0   triamcinolone  ointment (KENALOG ) 0.5 % Apply 1 Application topically 2 (two) times daily. 30 g 0   No current facility-administered medications on file prior to visit.  [2] No Known Allergies

## 2024-03-04 ENCOUNTER — Encounter: Payer: Self-pay | Admitting: Hematology and Oncology

## 2024-03-04 LAB — COMPREHENSIVE METABOLIC PANEL WITH GFR
ALT: 14 IU/L (ref 0–32)
AST: 19 IU/L (ref 0–40)
Albumin: 4.5 g/dL (ref 3.8–4.9)
Alkaline Phosphatase: 86 IU/L (ref 49–135)
BUN/Creatinine Ratio: 34 — ABNORMAL HIGH (ref 9–23)
BUN: 24 mg/dL (ref 6–24)
Bilirubin Total: 0.2 mg/dL (ref 0.0–1.2)
CO2: 26 mmol/L (ref 20–29)
Calcium: 9.8 mg/dL (ref 8.7–10.2)
Chloride: 102 mmol/L (ref 96–106)
Creatinine, Ser: 0.71 mg/dL (ref 0.57–1.00)
Globulin, Total: 2.5 g/dL (ref 1.5–4.5)
Glucose: 213 mg/dL — ABNORMAL HIGH (ref 70–99)
Potassium: 4.1 mmol/L (ref 3.5–5.2)
Sodium: 142 mmol/L (ref 134–144)
Total Protein: 7 g/dL (ref 6.0–8.5)
eGFR: 101 mL/min/1.73 (ref >59)

## 2024-03-04 LAB — CBC WITH DIFFERENTIAL/PLATELET
Basophils Absolute: 0 x10E3/uL (ref 0.0–0.2)
Basos: 1 %
EOS (ABSOLUTE): 0 x10E3/uL (ref 0.0–0.4)
Eos: 1 %
Hematocrit: 40.6 % (ref 34.0–46.6)
Hemoglobin: 13.3 g/dL (ref 11.1–15.9)
Immature Grans (Abs): 0 x10E3/uL (ref 0.0–0.1)
Immature Granulocytes: 0 %
Lymphocytes Absolute: 1.6 x10E3/uL (ref 0.7–3.1)
Lymphs: 38 %
MCH: 29 pg (ref 26.6–33.0)
MCHC: 32.8 g/dL (ref 31.5–35.7)
MCV: 89 fL (ref 79–97)
Monocytes Absolute: 0.4 x10E3/uL (ref 0.1–0.9)
Monocytes: 9 %
Neutrophils Absolute: 2.1 x10E3/uL (ref 1.4–7.0)
Neutrophils: 51 %
Platelets: 238 x10E3/uL (ref 150–450)
RBC: 4.59 x10E6/uL (ref 3.77–5.28)
RDW: 13.1 % (ref 11.7–15.4)
WBC: 4.1 x10E3/uL (ref 3.4–10.8)

## 2024-03-04 LAB — MICROALBUMIN / CREATININE URINE RATIO
Creatinine, Urine: 152.4 mg/dL
Microalb/Creat Ratio: 9 mg/g{creat} (ref 0–29)
Microalbumin, Urine: 13.3 ug/mL

## 2024-03-04 LAB — LIPID PANEL
Chol/HDL Ratio: 4.5 ratio — ABNORMAL HIGH (ref 0.0–4.4)
Cholesterol, Total: 247 mg/dL — ABNORMAL HIGH (ref 100–199)
HDL: 55 mg/dL (ref >39)
LDL Chol Calc (NIH): 147 mg/dL — ABNORMAL HIGH (ref 0–99)
Triglycerides: 248 mg/dL — ABNORMAL HIGH (ref 0–149)
VLDL Cholesterol Cal: 45 mg/dL — ABNORMAL HIGH (ref 5–40)

## 2024-03-04 LAB — HIV ANTIBODY (ROUTINE TESTING W REFLEX): HIV Screen 4th Generation wRfx: NONREACTIVE

## 2024-03-04 LAB — HEPATITIS C ANTIBODY: Hep C Virus Ab: NONREACTIVE

## 2024-03-05 ENCOUNTER — Ambulatory Visit: Admitting: Podiatry

## 2024-03-05 ENCOUNTER — Encounter: Payer: Self-pay | Admitting: Podiatry

## 2024-03-05 ENCOUNTER — Ambulatory Visit: Payer: Self-pay | Admitting: Internal Medicine

## 2024-03-05 DIAGNOSIS — L600 Ingrowing nail: Secondary | ICD-10-CM | POA: Diagnosis not present

## 2024-03-05 DIAGNOSIS — E1165 Type 2 diabetes mellitus with hyperglycemia: Secondary | ICD-10-CM | POA: Diagnosis not present

## 2024-03-06 LAB — FECAL OCCULT BLOOD, IMMUNOCHEMICAL: Fecal Occult Bld: NEGATIVE

## 2024-03-06 NOTE — Progress Notes (Signed)
 Subjective:   Patient ID: Crystal Vasquez, female   DOB: 53 y.o.   MRN: [id number]   HPI Patient presents stating she is having quite a bit of discomfort with ingrown toenails bilateral and also on the outside.  She does have a difficult living situation currently and is living in her car and states it is hard for her to take care of things.  Patient does not smoke tries to be active   Review of Systems  All other systems reviewed and are negative.       Objective:  Physical Exam Vitals and nursing note reviewed.  Constitutional:      Appearance: She is well-developed.  Pulmonary:     Effort: Pulmonary effort is normal.  Musculoskeletal:        General: Normal range of motion.  Skin:    General: Skin is warm.  Neurological:     Mental Status: She is alert.     Neurovascular status intact muscle strength found to be adequate range of motion adequate with patient noted to have discomfort around the nailbeds hallux bilateral left over right medial border.  Also has diabetes is hard for her to take care of it well and while she is now on medicine still running A1c of approximately 9.  Good digital perfusion well-oriented     Assessment:  Ingrown toenail deformity bilateral with patient who does have a suboptimal living situation and diabetes with mild elevation of A1c     Plan:  H&P reviewed at this point I debrided out the corners to take pressure off the nailbeds I discussed that we could do permanent procedure at 1 point in the future but I would like to see her situation stabilized before we do that and patient did state that the trimming seemed to help.  Reappoint as symptoms indicate and will probably long-term require permanent procedure

## 2024-03-25 ENCOUNTER — Encounter: Payer: Self-pay | Admitting: Internal Medicine

## 2024-03-28 ENCOUNTER — Other Ambulatory Visit: Payer: Self-pay | Admitting: Internal Medicine

## 2024-03-28 DIAGNOSIS — K529 Noninfective gastroenteritis and colitis, unspecified: Secondary | ICD-10-CM

## 2024-04-02 ENCOUNTER — Ambulatory Visit: Admitting: Internal Medicine

## 2024-04-07 ENCOUNTER — Ambulatory Visit: Admitting: Internal Medicine

## 2024-04-07 ENCOUNTER — Encounter: Payer: Self-pay | Admitting: Internal Medicine

## 2024-04-07 ENCOUNTER — Other Ambulatory Visit

## 2024-04-07 VITALS — BP 144/78 | Ht 66.0 in | Wt 173.0 lb

## 2024-04-07 DIAGNOSIS — E042 Nontoxic multinodular goiter: Secondary | ICD-10-CM

## 2024-04-07 DIAGNOSIS — Z7984 Long term (current) use of oral hypoglycemic drugs: Secondary | ICD-10-CM | POA: Diagnosis not present

## 2024-04-07 DIAGNOSIS — E0590 Thyrotoxicosis, unspecified without thyrotoxic crisis or storm: Secondary | ICD-10-CM

## 2024-04-07 DIAGNOSIS — E1165 Type 2 diabetes mellitus with hyperglycemia: Secondary | ICD-10-CM

## 2024-04-07 DIAGNOSIS — E119 Type 2 diabetes mellitus without complications: Secondary | ICD-10-CM

## 2024-04-07 LAB — POCT GLYCOSYLATED HEMOGLOBIN (HGB A1C): Hemoglobin A1C: 8.3 % — AB (ref 4.0–5.6)

## 2024-04-07 MED ORDER — LANTUS SOLOSTAR 100 UNIT/ML ~~LOC~~ SOPN: 12.0000 [IU] | PEN_INJECTOR | Freq: Every day | SUBCUTANEOUS | 3 refills | Status: AC

## 2024-04-07 MED ORDER — INSULIN PEN NEEDLE 32G X 4 MM MISC: 1.0000 | Freq: Every day | 3 refills | Status: AC

## 2024-04-07 NOTE — Patient Instructions (Addendum)
 Take Glipizide  10 mg, 2 tablet Before Breakfast and 2 tablet before Supper  Continue Metformin  500 mg XR , 2 tablets daily  Increase Lantus  12 units daily     HOW TO TREAT LOW BLOOD SUGARS (Blood sugar LESS THAN 70 MG/DL) Please follow the RULE OF 15 for the treatment of hypoglycemia treatment (when your (blood sugars are less than 70 mg/dL)   STEP 1: Take 15 grams of carbohydrates when your blood sugar is low, which includes:  3-4 GLUCOSE TABS  OR 3-4 OZ OF JUICE OR REGULAR SODA OR ONE TUBE OF GLUCOSE GEL    STEP 2: RECHECK blood sugar in 15 MINUTES STEP 3: If your blood sugar is still low at the 15 minute recheck --> then, go back to STEP 1 and treat AGAIN with another 15 grams of carbohydrates.

## 2024-04-07 NOTE — Progress Notes (Unsigned)
 " Name: Crystal Vasquez  MRN/ DOB: [id number], [birth year]   Age/ Sex: 54 y.o., female    PCP: Vicci Barnie NOVAK, MD   Reason for Endocrinology Evaluation: Type 2 Diabetes Mellitus     Date of Initial Endocrinology Visit: 02/20/2022    PATIENT IDENTIFIER: Crystal Vasquez is a 54 y.o. female with a past medical history of HTN, T2DM, history of thyroid  disease. The patient presented for initial endocrinology clinic visit on 02/20/2022 for consultative assistance with her diabetes management.    HPI: Ms. Alpert was    Diagnosed with DM 10-15 yrs ago  Prior Medications tried/Intolerance: Glipizide  was started ~ 4 yrs ago                 Hemoglobin A1c has ranged from 6.7% in 2022, peaking at 9.0% in 2022.   She lives in her car which makes it difficult to eat healthy   She was started on basal insulin  through her PCPs office with an A1c of 9.9% and was able to keep her insulin  in the fridge at work  THYROID  HISTORY She had an FNA ~ 4 yrs ago of the thyroid  while living in [place] Denies local neck swelling   Has Maternal grandmother and cousin and maternal aunt with  Hx of MEN   I had ordered thyroid  ultrasound 02/2022 but by her visit in 08/2022 has not been done Thyroid  ultrasound was done 12/2023  Started methimazole  12/2023 with a TSH of 0.398 u IU/ML, normal free T3 and free T4, we opted to treat due to palpitations as well as changes in bowel movements and occasional tremors  SUBJECTIVE:   During the last visit (01/04/2024): A1c 9.9%    Today (04/07/24): Crystal Vasquez is here for follow-up on diabetes management.She  checks her blood sugars occasionally.    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  Continues with nausea and GERD , pending GI evaluation  Has noted diarrhea alternating with constipation  Palpitations have been overall  Hand tremors continue to be intermittent   HOME DIABETES REGIMEN: Glipizide  10 mg, 2 tabs before breakfast and 1.5 tabs before supper- takes 2 tabs BID Metformin  500 mg XR, 2 tabs  Lantus  10 units daily  Methimazole  5 mg, 1 tablet Monday through Friday only    Statin: Yes ACE-I/ARB: Yes    METER DOWNLOAD SUMMARY:   99- 196 mg/dL    DIABETIC COMPLICATIONS: Microvascular complications:   Denies: CKD, neuropathy, retinopathy  Last eye exam: Completed 03/03/2024  Macrovascular complications:   Denies: CAD, PVD, CVA   PAST HISTORY: Past Medical History:  Past Medical History:  Diagnosis Date   Anemia    Asthma    due to acid reflux   Depression    DM2    Dyspnea    GERD (gastroesophageal reflux disease)    Headache    Heart murmur    benign   Hypertension    Obesity (BMI 30-39.9)    Thyroid  disease    Past Surgical History:  Past Surgical History:  Procedure Laterality Date   ABDOMINAL HYSTERECTOMY     BIOPSY THYROID   IR ANGIOGRAM PELVIS SELECTIVE OR SUPRASELECTIVE  05/09/2021   IR ANGIOGRAM SELECTIVE EACH ADDITIONAL VESSEL  05/09/2021   IR AORTAGRAM ABDOMINAL SERIALOGRAM  05/09/2021   IR EMBO TUMOR ORGAN ISCHEMIA INFARCT INC GUIDE ROADMAPPING  05/09/2021   IR RADIOLOGIST EVAL & MGMT  04/11/2021   IR US  GUIDE VASC ACCESS LEFT  05/09/2021   ROBOTIC ASSISTED LAPAROSCOPIC HYSTERECTOMY AND SALPINGECTOMY Bilateral 05/18/2021   Procedure: XI ROBOTIC ASSISTED LAPAROSCOPIC HYSTERECTOMY AND SALPINGECTOMY, UTERUS MORE THAN 250 GRAMS;  Surgeon: Viktoria Comer SAUNDERS, MD;  Location: [facility];  Service: Gynecology;  Laterality: Bilateral;    Social History:  reports that she has never smoked. She has never been exposed to tobacco smoke. She  has never used smokeless tobacco. She reports that she does not currently use alcohol. She reports that she does not use drugs. Family History:  Family History  Problem Relation Age of Onset   Diabetes Mother    Hypertension Mother    Diabetes Father    Hypertension Father    Endometrial cancer Maternal Aunt    Colon cancer Neg Hx    Breast cancer Neg Hx    Ovarian cancer Neg Hx    Pancreatic cancer Neg Hx    Prostate cancer Neg Hx      HOME MEDICATIONS: Allergies as of 04/07/2024   No Known Allergies      Medication List        Accurate as of April 07, 2024  9:18 AM. If you have any questions, ask your nurse or doctor.          Accu-Chek Guide Me w/Device Kit Check blood sugar one time daily   Accu-Chek Guide Test test strip Generic drug: glucose blood Check blood sugar one time daily   Accu-Chek Softclix Lancets lancets Check blood sugar one time daily   Acetaminophen  Extra Strength 500 MG Caps SMARTSIG:2 Capsule(s) By Mouth Daily PRN   albuterol  108 (90 Base) MCG/ACT inhaler Commonly known as: VENTOLIN  HFA Inhale 2 puffs into the lungs every 4 (four) hours as needed for wheezing or shortness of breath.   amLODipine  10 MG tablet Commonly known as: NORVASC  Take 1 tablet (10 mg total) by mouth daily.   atorvastatin  40 MG tablet Commonly known as: LIPITOR TAKE 1 TABLET BY MOUTH EVERY DAY   bisoprolol  10 MG tablet Commonly known as: ZEBETA  Take 1 tablet (10 mg total) by mouth daily.   cyclobenzaprine  5 MG tablet Commonly known as: FLEXERIL  Take 1 tablet (5 mg total) by mouth 2 (two) times daily as needed for muscle spasms.   ferrous sulfate  325 (65 FE) MG tablet Take 325 mg by mouth in the morning.   fluticasone  50 MCG/ACT nasal spray Commonly known as: FLONASE  Place 2 sprays into both nostrils daily.   gabapentin  300 MG capsule Commonly known as: NEURONTIN  Take 1 capsule (300 mg total) by mouth at bedtime.   glipiZIDE  10 MG tablet Commonly  known as: GLUCOTROL  Take 2 tablets (20 mg total) by mouth 2 (two) times daily before a meal.   hydrALAZINE  50 MG tablet Commonly known as: APRESOLINE  TAKE 1.5 TABLETS BY MOUTH 3 TIMES A DAY   hydrochlorothiazide  25 MG tablet Commonly known as: HYDRODIURIL  Take 1 tablet (25 mg total) by mouth daily.   hydrOXYzine  10 MG tablet Commonly known as: ATARAX  Take 1 tablet (10 mg total) by mouth 3 (three) times daily as needed for anxiety.   Insulin  Pen Needle 32G X 4 MM Misc 1 Device by Does not  apply route daily in the afternoon.   Lantus  SoloStar 100 UNIT/ML Solostar Pen Generic drug: insulin  glargine Inject 10 Units into the skin daily.   meloxicam  15 MG tablet Commonly known as: MOBIC  Take 1 tablet (15 mg total) by mouth daily as needed.   metFORMIN  500 MG 24 hr tablet Commonly known as: GLUCOPHAGE -XR Take 2 tablets (1,000 mg total) by mouth daily with breakfast.   methimazole  5 MG tablet Commonly known as: TAPAZOLE  Take 1 tablet (5 mg total) by mouth as directed. 1 tablet 5 days a week   olmesartan  40 MG tablet Commonly known as: BENICAR  Take 1 tablet (40 mg total) by mouth daily.   ondansetron  4 MG disintegrating tablet Commonly known as: ZOFRAN -ODT Take 1 tablet (4 mg total) by mouth every 8 (eight) hours as needed for nausea or vomiting.   QUEtiapine  50 MG tablet Commonly known as: SEROquel  Take 1 tablet (50 mg total) by mouth at bedtime.   sertraline  100 MG tablet Commonly known as: ZOLOFT  TAKE 2 TABLETS BY MOUTH EVERY DAY   terbinafine  250 MG tablet Commonly known as: LAMISIL  Take 1 tablet (250 mg total) by mouth daily.   triamcinolone  ointment 0.5 % Commonly known as: KENALOG  Apply 1 Application topically 2 (two) times daily.         ALLERGIES: No Known Allergies   REVIEW OF SYSTEMS: A comprehensive ROS was conducted with the patient and is negative except as per HPI    OBJECTIVE:   VITAL SIGNS: Ht 5' 6 (1.676 m)   LMP 04/27/2021   BMI  27.92 kg/m    PHYSICAL EXAM:  General: Pt appears well and is in NAD  Neck:  Thyroid : Prominent thyroid  gland  Lungs: Clear with good BS bilat   Heart: RRR   Neuro: MS is good with appropriate affect, pt is alert and Ox3    DM foot exam: 01/04/2024  The skin of the feet is intact without sores or ulcerations. The pedal pulses are 2+ on right and 2+ on left. The sensation is intact to a screening 5.07, 10 gram monofilament bilaterally   DATA REVIEWED:  Lab Results  Component Value Date   HGBA1C 9.9 (A) 01/04/2024   HGBA1C 8.5 (A) 11/01/2023   HGBA1C 8.3 (A) 05/17/2023     Latest Reference Range & Units 11/01/23 12:31  TSH 0.450 - 4.500 uIU/mL 0.398 (L)  Triiodothyronine,Free,Serum 2.0 - 4.4 pg/mL 3.2  T4,Free(Direct) 0.82 - 1.77 ng/dL 8.85    Latest Reference Range & Units 03/03/24 12:02  Sodium 134 - 144 mmol/L 142  Potassium 3.5 - 5.2 mmol/L 4.1  Chloride 96 - 106 mmol/L 102  CO2 20 - 29 mmol/L 26  Glucose 70 - 99 mg/dL 786 (H)  BUN 6 - 24 mg/dL 24  Creatinine 9.42 - 8.99 mg/dL 9.28  Calcium  8.7 - 10.2 mg/dL 9.8  BUN/Creatinine Ratio 9 - 23  34 (H)  eGFR >59 mL/min/1.73 101  Alkaline Phosphatase 49 - 135 IU/L 86  Albumin 3.8 - 4.9 g/dL 4.5  AST 0 - 40 IU/L 19  ALT 0 - 32 IU/L 14  Total Protein 6.0 - 8.5 g/dL 7.0  Total Bilirubin 0.0 - 1.2 mg/dL 0.2  (H): Data is abnormally high   Thyroid  ultrasound 01/09/2024  TECHNIQUE: Ultrasound examination of the thyroid  gland and adjacent soft tissues was performed.   COMPARISON:  None available.   FINDINGS: Parenchymal Echotexture: Mildly heterogenous   Isthmus: 0.5 cm   Right lobe: 5.7 x 2.0 x 2.5  cm   Left lobe: 5.6 x 2.1 x 2.2 cm   _________________________________________________________   Estimated total number of nodules >/= 1 cm: 2   Number of spongiform nodules >/=  2 cm not described below (TR1): 0   Number of mixed cystic and solid nodules >/= 1.5 cm not described below (TR2): 0    _________________________________________________________   Nodule 3: 1.3 x 0.9 x 1.2 cm mixed solid cystic isoechoic RIGHT mid thyroid  nodule (TI-RADS 2) does not meet criteria for FNA or imaging follow-up.   Nodule 8: 1.0 x 0.5 x 0.8 cm solid hypoechoic LEFT inferior thyroid  nodule (TI-RADS 4) meets criteria for imaging follow-up.   Nodules 1, 2, 4, 5, 6, and 7 are subcentimeter in size and do not meet criteria for FNA or imaging follow-up.   IMPRESSION: 1. Multinodular goiter. 2. Nodule 8 (TI-RADS 4), measuring 1.0 cm, located in the inferior LEFT thyroid  lobe meets criteria for imaging follow-up. Annual ultrasound surveillance is recommended until 5 years of stability is documented.   ASSESSMENT / PLAN / RECOMMENDATIONS:   1) Type 2 Diabetes Mellitus, poorly controlled, Without complications - Most recent A1c of 8.3 %. Goal A1c < 7.0 %.    -A1c has trended down from 9.9% to 8.3% -Patient was social determinants -Intolerant to higher doses of metformin  - We detained the idea of SGLT2 inhibitors, we discussed cardiovascular and renal benefits we also discussed the risk of genital infections, we will reconsider this in the future with patient assistance - In the meantime, patient was advised to increase her Lantus   MEDICATIONS: Continue glipizide  10 mg, 2 tablet before breakfast and 2 tablet before supper Continue metformin  500 mg XR, 1 tabs daily Increase Lantus  12 units daily  EDUCATION / INSTRUCTIONS: BG monitoring instructions: Patient is instructed to check her blood sugars 2-3 times a week. Call [facility] if: BG persistently < 70  I reviewed the Rule of 15 for the treatment of hypoglycemia in detail with the patient. Literature supplied.   2) Diabetic complications:  Eye: Does not have known diabetic retinopathy.  Neuro/ Feet: Does not have known diabetic peripheral neuropathy. Renal: Patient does not have known baseline CKD. She is  on an  ACEI/ARB at present.  3) Multinodular goiter:  -Patient is s/p benign FNA of a thyroid  nodule approximately years ago while living in [place]  - Up-to-date on thyroid  ultrasound, no FNA needed    4) Hyperthyroidism:  -Possibly due to autonomous thyroid  nodule -We opted to treat with methimazole  in 12/2023 due to palpitations, changes in bowel movements and occasional tremors -Thyroid  ultrasound confirmed multiple thyroid  nodules in October, 2025.  Not met criteria for FNA - TFTs today***  Medication  Take methimazole  5 mg, 1 tablet Monday through Friday, none on Saturdays or Sundays  Follow-up in 4 months  Signed electronically by: Stefano Redgie Butts, MD  [facility] Endocrinology  [facility] [address] [place] [place], [place] [address] Phone: [phone] FAX: [phone]   CC: Vicci Barnie NOVAK, MD [id number] [place] [address] [place] [address] Phone: [phone]  Fax: [phone]    Return to Endocrinology clinic as below: Future Appointments  Date Time Provider Department Center  04/22/2024  7:20 AM [facility] MM 2 [facility]  06/09/2024  1:30 PM Vickie Berber, MD [facility] None  07/03/2024 10:30 AM Vicci Barnie NOVAK, MD [facility] [address]     "

## 2024-04-08 ENCOUNTER — Ambulatory Visit: Payer: Self-pay | Admitting: Internal Medicine

## 2024-04-08 ENCOUNTER — Other Ambulatory Visit: Payer: Self-pay | Admitting: Internal Medicine

## 2024-04-08 DIAGNOSIS — F411 Generalized anxiety disorder: Secondary | ICD-10-CM

## 2024-04-08 DIAGNOSIS — F330 Major depressive disorder, recurrent, mild: Secondary | ICD-10-CM

## 2024-04-08 DIAGNOSIS — F4310 Post-traumatic stress disorder, unspecified: Secondary | ICD-10-CM

## 2024-04-08 LAB — TSH: TSH: 0.27 m[IU]/L — ABNORMAL LOW

## 2024-04-08 LAB — T4, FREE: Free T4: 1.1 ng/dL (ref 0.8–1.8)

## 2024-04-08 MED ORDER — METHIMAZOLE 5 MG PO TABS: 5.0000 mg | ORAL_TABLET | Freq: Every day | ORAL | 2 refills | Status: AC

## 2024-04-08 NOTE — Telephone Encounter (Signed)
 Requested Prescriptions  Pending Prescriptions Disp Refills   sertraline  (ZOLOFT ) 100 MG tablet [Pharmacy Med Name: SERTRALINE  HCL 100 MG TABLET] 180 tablet 1    Sig: TAKE 2 TABLETS BY MOUTH EVERY DAY     Psychiatry:  Antidepressants - SSRI - sertraline  Passed - 04/08/2024  5:10 PM      Passed - AST in normal range and within 360 days    AST  Date Value Ref Range Status  03/03/2024 19 0 - 40 IU/L Final  10/27/2021 16 15 - 41 U/L Final         Passed - ALT in normal range and within 360 days    ALT  Date Value Ref Range Status  03/03/2024 14 0 - 32 IU/L Final  10/27/2021 19 0 - 44 U/L Final         Passed - Completed PHQ-2 or PHQ-9 in the last 360 days      Passed - Valid encounter within last 6 months    Recent Outpatient Visits           1 month ago Annual physical exam   [facility] [place]. [facility] Vicci Sober B, MD   5 months ago Type 2 diabetes mellitus with other specified complication, with long-term current use of insulin  [facility])   [facility] [place]. [facility] Vicci Sober B, MD   10 months ago Diabetes mellitus treated with oral medication [facility])   [facility] [place]. [facility] Vicci Sober B, MD   1 year ago Type 2 diabetes mellitus with other specified complication, without long-term current use of insulin  [facility])   [facility] [place]. [facility] [place], [place], [place]   1 year ago Type 2 diabetes mellitus with other specified complication, without long-term current use of insulin  [facility])   [facility] Vicci Sober NOVAK, MD

## 2024-04-18 ENCOUNTER — Other Ambulatory Visit: Payer: Self-pay | Admitting: Medical Genetics

## 2024-04-18 ENCOUNTER — Ambulatory Visit: Admitting: Internal Medicine

## 2024-04-21 ENCOUNTER — Telehealth: Admitting: Physician Assistant

## 2024-04-21 DIAGNOSIS — J069 Acute upper respiratory infection, unspecified: Secondary | ICD-10-CM | POA: Diagnosis not present

## 2024-04-21 DIAGNOSIS — R051 Acute cough: Secondary | ICD-10-CM | POA: Diagnosis not present

## 2024-04-21 MED ORDER — LEVOCETIRIZINE DIHYDROCHLORIDE 5 MG PO TABS: 5.0000 mg | ORAL_TABLET | Freq: Every evening | ORAL | 0 refills | Status: AC

## 2024-04-21 MED ORDER — PROMETHAZINE-DM 6.25-15 MG/5ML PO SYRP: 5.0000 mL | ORAL_SOLUTION | Freq: Four times a day (QID) | ORAL | 0 refills | Status: AC | PRN

## 2024-04-21 NOTE — Patient Instructions (Signed)
 " Shonika A Tsui, thank you for joining Teena Shuck, PA-C for today's virtual visit.  While this provider is not your primary care provider (PCP), if your PCP is located in our provider database this encounter information will be shared with them immediately following your visit.   A [facility] MyChart account gives you access to today's visit and all your visits, tests, and labs performed at [facility]  click here if you don't have a [facility] MyChart account or go to [email or web address]  Consent: (Patient) Crystal Vasquez provided verbal consent for this virtual visit at the beginning of the encounter.  Current Medications:  Current Outpatient Medications:    Accu-Chek Softclix Lancets lancets, Check blood sugar one time daily, Disp: 100 each, Rfl: 12   ACETAMINOPHEN  EXTRA STRENGTH 500 MG capsule, SMARTSIG:2 Capsule(s) By Mouth Daily PRN, Disp: , Rfl:    albuterol  (VENTOLIN  HFA) 108 (90 Base) MCG/ACT inhaler, Inhale 2 puffs into the lungs every 4 (four) hours as needed for wheezing or shortness of breath., Disp: 1 each, Rfl: 0   amLODipine  (NORVASC ) 10 MG tablet, Take 1 tablet (10 mg total) by mouth daily., Disp: 90 tablet, Rfl: 1   atorvastatin  (LIPITOR) 40 MG tablet, TAKE 1 TABLET BY MOUTH EVERY DAY, Disp: 90 tablet, Rfl: 1   bisoprolol  (ZEBETA ) 10 MG tablet, Take 1 tablet (10 mg total) by mouth daily., Disp: 90 tablet, Rfl: 1   Blood Glucose Monitoring Suppl (ACCU-CHEK GUIDE ME) w/Device KIT, Check blood sugar one time daily, Disp: 1 kit, Rfl: 0   cyclobenzaprine  (FLEXERIL ) 5 MG tablet, Take 1 tablet (5 mg total) by mouth 2 (two) times daily as needed for muscle spasms., Disp: 30 tablet, Rfl: 0   ferrous sulfate  325 (65 FE) MG tablet, Take 325 mg by mouth in the morning., Disp: , Rfl:    fluticasone  (FLONASE ) 50 MCG/ACT nasal spray, Place 2 sprays into both nostrils daily., Disp: 16 g, Rfl: 0   gabapentin  (NEURONTIN ) 300 MG capsule, Take 1 capsule (300 mg  total) by mouth at bedtime., Disp: 90 capsule, Rfl: 1   glipiZIDE  (GLUCOTROL ) 10 MG tablet, Take 2 tablets (20 mg total) by mouth 2 (two) times daily before a meal., Disp: 360 tablet, Rfl: 2   glucose blood (ACCU-CHEK GUIDE TEST) test strip, Check blood sugar one time daily, Disp: 100 each, Rfl: 12   hydrALAZINE  (APRESOLINE ) 50 MG tablet, TAKE 1.5 TABLETS BY MOUTH 3 TIMES A DAY, Disp: 405 tablet, Rfl: 1   hydrochlorothiazide  (HYDRODIURIL ) 25 MG tablet, Take 1 tablet (25 mg total) by mouth daily., Disp: 90 tablet, Rfl: 1   hydrOXYzine  (ATARAX ) 10 MG tablet, Take 1 tablet (10 mg total) by mouth 3 (three) times daily as needed for anxiety., Disp: 90 tablet, Rfl: 2   insulin  glargine (LANTUS  SOLOSTAR) 100 UNIT/ML Solostar Pen, Inject 12 Units into the skin daily., Disp: 15 mL, Rfl: 3   Insulin  Pen Needle 32G X 4 MM MISC, 1 Device by Does not apply route daily in the afternoon., Disp: 100 each, Rfl: 3   meloxicam  (MOBIC ) 15 MG tablet, Take 1 tablet (15 mg total) by mouth daily as needed., Disp: 30 tablet, Rfl: 2   metFORMIN  (GLUCOPHAGE -XR) 500 MG 24 hr tablet, Take 2 tablets (1,000 mg total) by mouth daily with breakfast., Disp: 180 tablet, Rfl: 2   methimazole  (TAPAZOLE ) 5 MG tablet, Take 1 tablet (5 mg total) by mouth daily., Disp: 90 tablet, Rfl: 2   olmesartan  (BENICAR ) 40 MG tablet, Take  1 tablet (40 mg total) by mouth daily., Disp: 90 tablet, Rfl: 3   ondansetron  (ZOFRAN -ODT) 4 MG disintegrating tablet, Take 1 tablet (4 mg total) by mouth every 8 (eight) hours as needed for nausea or vomiting., Disp: 20 tablet, Rfl: 0   QUEtiapine  (SEROQUEL ) 50 MG tablet, Take 1 tablet (50 mg total) by mouth at bedtime., Disp: 30 tablet, Rfl: 1   sertraline  (ZOLOFT ) 100 MG tablet, TAKE 2 TABLETS BY MOUTH EVERY DAY, Disp: 180 tablet, Rfl: 1   terbinafine  (LAMISIL ) 250 MG tablet, Take 1 tablet (250 mg total) by mouth daily., Disp: 30 tablet, Rfl: 1   triamcinolone  ointment (KENALOG ) 0.5 %, Apply 1 Application  topically 2 (two) times daily., Disp: 30 g, Rfl: 0   Medications ordered in this encounter:  No orders of the defined types were placed in this encounter.    *If you need refills on other medications prior to your next appointment, please contact your pharmacy*  Follow-Up: Call back or seek an in-person evaluation if the symptoms worsen or if the condition fails to improve as anticipated.     Other Instructions Follow up with primary provider in 24-48 hours. Report to nearest ER with any worsening symptoms.    If you have been instructed to have an in-person evaluation today at a local Urgent Care facility, please use the link below. It will take you to a list of all of our available [facility], including address, phone number and hours of operation. Please do not delay care.  [facility]  If you or a family member do not have a primary care provider, use the link below to schedule a visit and establish care. When you choose a [facility] primary care physician or advanced practice provider, you gain a long-term partner in health. Find a Primary Care Provider  Learn more about [facility]'s in-office and virtual care options: [place] - Get Care Now  "

## 2024-04-21 NOTE — Progress Notes (Signed)
 " Virtual Visit Consent   Crystal Vasquez, you are scheduled for a virtual visit with a [facility] provider today. Just as with appointments in the office, your consent must be obtained to participate. Your consent will be active for this visit and any virtual visit you may have with one of our providers in the next 365 days. If you have a MyChart account, a copy of this consent can be sent to you electronically.  As this is a virtual visit, video technology does not allow for your provider to perform a traditional examination. This may limit your provider's ability to fully assess your condition. If your provider identifies any concerns that need to be evaluated in person or the need to arrange testing (such as labs, EKG, etc.), we will make arrangements to do so. Although advances in technology are sophisticated, we cannot ensure that it will always work on either your end or our end. If the connection with a video visit is poor, the visit may have to be switched to a telephone visit. With either a video or telephone visit, we are not always able to ensure that we have a secure connection.  By engaging in this virtual visit, you consent to the provision of healthcare and authorize for your insurance to be billed (if applicable) for the services provided during this visit. Depending on your insurance coverage, you may receive a charge related to this service.  I need to obtain your verbal consent now. Are you willing to proceed with your visit today? Crystal Vasquez has provided verbal consent on 04/21/2024 for a virtual visit (video or telephone). Crystal Vasquez, [place]  Date: 04/21/2024 5:48 PM   Virtual Visit via Video Note   I, Crystal Vasquez, connected with  Crystal Vasquez  ([id number], 1970-04-02) on 04/21/24 at  6:00 PM EST by a video-enabled telemedicine application and verified that I am speaking with the correct person using two identifiers.  Location: Patient: Virtual Visit  Location Patient: Home Provider: Virtual Visit Location Provider: Home Office   I discussed the limitations of evaluation and management by telemedicine and the availability of in person appointments. The patient expressed understanding and agreed to proceed.    History of Present Illness: Crystal Vasquez is a 54 y.o. who identifies as a female who was assigned female at birth, and is being seen today for URI.  HPI: URI  This is a recurrent problem. The current episode started in the past 7 days. The problem has been resolved. There has been no fever. Associated symptoms include congestion, coughing and rhinorrhea. She has tried acetaminophen  for the symptoms. The treatment provided mild relief.    Problems:  Patient Active Problem List   Diagnosis Date Noted   GAD (generalized anxiety disorder) 08/02/2023   MDD (major depressive disorder), recurrent episode, moderate (HCC) 08/02/2023   Binge eating 08/02/2023   Subclinical hyperthyroidism 02/20/2022   Hyperlipidemia associated with type 2 diabetes mellitus (HCC) 01/24/2022   Excessive daytime sleepiness 10/31/2021   Snoring 10/31/2021   Fever postop 05/19/2021   Uterine polyp 05/09/2021   Hypertension associated with type 2 diabetes mellitus (HCC) 12/03/2020   Uncontrolled type 2 diabetes mellitus with hyperglycemia (HCC) 12/03/2020   Uterine mass 12/03/2020   Abnormal uterine bleeding (AUB) 11/11/2020   History of uterine fibroid 11/11/2020   Iron deficiency anemia due to chronic blood loss 11/11/2020   History of menorrhagia 11/11/2020    Allergies: Allergies[1] Medications: Current Medications[2]  Observations/Objective: Patient is well-developed,  well-nourished in no acute distress.  Resting comfortably  at home.  Head is normocephalic, atraumatic.  No labored breathing.  Speech is clear and coherent with logical content.  Patient is alert and oriented at baseline.    Assessment and Plan: 1. Upper respiratory  tract infection, unspecified type (Primary)   Patient presenting with URI. Differentials include allergic rhinitis, COVID,  bacterial pneumonia, lower respiratory tract, sinusitis. Do not suspect underlying cardiopulmonary process. I considered, but think unlikely, dangerous causes of this patient's symptoms to include ACS, CHF or pneumonia, pneumothorax. Patient is nontoxic and not in need of emergent medical intervention.  Plan: reassurance, reassessment, discharge with PCP follow-up.   Follow Up Instructions: I discussed the assessment and treatment plan with the patient. The patient was provided an opportunity to ask questions and all were answered. The patient agreed with the plan and demonstrated an understanding of the instructions.  A copy of instructions were sent to the patient via MyChart unless otherwise noted below.     The patient was advised to call back or seek an in-person evaluation if the symptoms worsen or if the condition fails to improve as anticipated.    Jadarrius Maselli, PA-C    [1] No Known Allergies [2]  Current Outpatient Medications:    Accu-Chek Softclix Lancets lancets, Check blood sugar one time daily, Disp: 100 each, Rfl: 12   ACETAMINOPHEN  EXTRA STRENGTH 500 MG capsule, SMARTSIG:2 Capsule(s) By Mouth Daily PRN, Disp: , Rfl:    albuterol  (VENTOLIN  HFA) 108 (90 Base) MCG/ACT inhaler, Inhale 2 puffs into the lungs every 4 (four) hours as needed for wheezing or shortness of breath., Disp: 1 each, Rfl: 0   amLODipine  (NORVASC ) 10 MG tablet, Take 1 tablet (10 mg total) by mouth daily., Disp: 90 tablet, Rfl: 1   atorvastatin  (LIPITOR) 40 MG tablet, TAKE 1 TABLET BY MOUTH EVERY DAY, Disp: 90 tablet, Rfl: 1   bisoprolol  (ZEBETA ) 10 MG tablet, Take 1 tablet (10 mg total) by mouth daily., Disp: 90 tablet, Rfl: 1   Blood Glucose Monitoring Suppl (ACCU-CHEK GUIDE ME) w/Device KIT, Check blood sugar one time daily, Disp: 1 kit, Rfl: 0   cyclobenzaprine  (FLEXERIL ) 5 MG  tablet, Take 1 tablet (5 mg total) by mouth 2 (two) times daily as needed for muscle spasms., Disp: 30 tablet, Rfl: 0   ferrous sulfate  325 (65 FE) MG tablet, Take 325 mg by mouth in the morning., Disp: , Rfl:    fluticasone  (FLONASE ) 50 MCG/ACT nasal spray, Place 2 sprays into both nostrils daily., Disp: 16 g, Rfl: 0   gabapentin  (NEURONTIN ) 300 MG capsule, Take 1 capsule (300 mg total) by mouth at bedtime., Disp: 90 capsule, Rfl: 1   glipiZIDE  (GLUCOTROL ) 10 MG tablet, Take 2 tablets (20 mg total) by mouth 2 (two) times daily before a meal., Disp: 360 tablet, Rfl: 2   glucose blood (ACCU-CHEK GUIDE TEST) test strip, Check blood sugar one time daily, Disp: 100 each, Rfl: 12   hydrALAZINE  (APRESOLINE ) 50 MG tablet, TAKE 1.5 TABLETS BY MOUTH 3 TIMES A DAY, Disp: 405 tablet, Rfl: 1   hydrochlorothiazide  (HYDRODIURIL ) 25 MG tablet, Take 1 tablet (25 mg total) by mouth daily., Disp: 90 tablet, Rfl: 1   hydrOXYzine  (ATARAX ) 10 MG tablet, Take 1 tablet (10 mg total) by mouth 3 (three) times daily as needed for anxiety., Disp: 90 tablet, Rfl: 2   insulin  glargine (LANTUS  SOLOSTAR) 100 UNIT/ML Solostar Pen, Inject 12 Units into the skin daily., Disp: 15 mL, Rfl:  3   Insulin  Pen Needle 32G X 4 MM MISC, 1 Device by Does not apply route daily in the afternoon., Disp: 100 each, Rfl: 3   meloxicam  (MOBIC ) 15 MG tablet, Take 1 tablet (15 mg total) by mouth daily as needed., Disp: 30 tablet, Rfl: 2   metFORMIN  (GLUCOPHAGE -XR) 500 MG 24 hr tablet, Take 2 tablets (1,000 mg total) by mouth daily with breakfast., Disp: 180 tablet, Rfl: 2   methimazole  (TAPAZOLE ) 5 MG tablet, Take 1 tablet (5 mg total) by mouth daily., Disp: 90 tablet, Rfl: 2   olmesartan  (BENICAR ) 40 MG tablet, Take 1 tablet (40 mg total) by mouth daily., Disp: 90 tablet, Rfl: 3   ondansetron  (ZOFRAN -ODT) 4 MG disintegrating tablet, Take 1 tablet (4 mg total) by mouth every 8 (eight) hours as needed for nausea or vomiting., Disp: 20 tablet, Rfl: 0    QUEtiapine  (SEROQUEL ) 50 MG tablet, Take 1 tablet (50 mg total) by mouth at bedtime., Disp: 30 tablet, Rfl: 1   sertraline  (ZOLOFT ) 100 MG tablet, TAKE 2 TABLETS BY MOUTH EVERY DAY, Disp: 180 tablet, Rfl: 1   terbinafine  (LAMISIL ) 250 MG tablet, Take 1 tablet (250 mg total) by mouth daily., Disp: 30 tablet, Rfl: 1   triamcinolone  ointment (KENALOG ) 0.5 %, Apply 1 Application topically 2 (two) times daily., Disp: 30 g, Rfl: 0  "

## 2024-04-22 ENCOUNTER — Ambulatory Visit

## 2024-06-09 ENCOUNTER — Ambulatory Visit (HOSPITAL_COMMUNITY): Payer: Self-pay

## 2024-07-03 ENCOUNTER — Ambulatory Visit: Payer: Self-pay | Admitting: Internal Medicine

## 2024-08-04 ENCOUNTER — Ambulatory Visit: Admitting: Internal Medicine
